# Patient Record
Sex: Male | Born: 1965 | Race: White | Hispanic: No | Marital: Single | Smoking: Former smoker
Health system: Southern US, Community
[De-identification: ages and names within clinical notes are randomized; demographics above are authoritative.]

## PROBLEM LIST (undated history)

## (undated) DIAGNOSIS — F32A Depression, unspecified: Secondary | ICD-10-CM

## (undated) DIAGNOSIS — N503 Cyst of epididymis: Secondary | ICD-10-CM

## (undated) DIAGNOSIS — M549 Dorsalgia, unspecified: Secondary | ICD-10-CM

## (undated) DIAGNOSIS — I10 Essential (primary) hypertension: Secondary | ICD-10-CM

## (undated) DIAGNOSIS — F329 Major depressive disorder, single episode, unspecified: Secondary | ICD-10-CM

## (undated) DIAGNOSIS — K219 Gastro-esophageal reflux disease without esophagitis: Secondary | ICD-10-CM

## (undated) DIAGNOSIS — K2970 Gastritis, unspecified, without bleeding: Secondary | ICD-10-CM

## (undated) DIAGNOSIS — R0789 Other chest pain: Secondary | ICD-10-CM

## (undated) DIAGNOSIS — G8929 Other chronic pain: Secondary | ICD-10-CM

## (undated) DIAGNOSIS — F1010 Alcohol abuse, uncomplicated: Secondary | ICD-10-CM

## (undated) DIAGNOSIS — K709 Alcoholic liver disease, unspecified: Secondary | ICD-10-CM

## (undated) HISTORY — PX: ABDOMINAL SURGERY: SHX537

## (undated) HISTORY — PX: APPENDECTOMY: SHX54

---

## 2000-01-25 ENCOUNTER — Emergency Department (HOSPITAL_COMMUNITY): Admission: EM | Admit: 2000-01-25 | Discharge: 2000-01-25 | Payer: Self-pay | Admitting: Emergency Medicine

## 2007-08-27 ENCOUNTER — Emergency Department (HOSPITAL_COMMUNITY): Admission: EM | Admit: 2007-08-27 | Discharge: 2007-08-27 | Payer: Self-pay | Admitting: Emergency Medicine

## 2007-10-19 ENCOUNTER — Inpatient Hospital Stay (HOSPITAL_COMMUNITY): Admission: EM | Admit: 2007-10-19 | Discharge: 2007-10-24 | Payer: Self-pay | Admitting: Pulmonary Disease

## 2007-10-23 ENCOUNTER — Ambulatory Visit: Payer: Self-pay | Admitting: Psychiatry

## 2007-12-27 ENCOUNTER — Inpatient Hospital Stay (HOSPITAL_COMMUNITY): Admission: EM | Admit: 2007-12-27 | Discharge: 2008-01-02 | Payer: Self-pay | Admitting: Emergency Medicine

## 2008-03-09 ENCOUNTER — Inpatient Hospital Stay (HOSPITAL_COMMUNITY): Admission: EM | Admit: 2008-03-09 | Discharge: 2008-03-12 | Payer: Self-pay | Admitting: Emergency Medicine

## 2008-03-11 ENCOUNTER — Encounter (INDEPENDENT_AMBULATORY_CARE_PROVIDER_SITE_OTHER): Payer: Self-pay | Admitting: Internal Medicine

## 2008-04-03 ENCOUNTER — Ambulatory Visit: Payer: Self-pay | Admitting: *Deleted

## 2008-04-03 ENCOUNTER — Inpatient Hospital Stay (HOSPITAL_COMMUNITY): Admission: EM | Admit: 2008-04-03 | Discharge: 2008-04-06 | Payer: Self-pay | Admitting: Emergency Medicine

## 2008-04-19 ENCOUNTER — Inpatient Hospital Stay (HOSPITAL_COMMUNITY): Admission: EM | Admit: 2008-04-19 | Discharge: 2008-04-20 | Payer: Self-pay | Admitting: Emergency Medicine

## 2008-05-04 ENCOUNTER — Inpatient Hospital Stay (HOSPITAL_COMMUNITY): Admission: EM | Admit: 2008-05-04 | Discharge: 2008-05-11 | Payer: Self-pay | Admitting: Emergency Medicine

## 2009-01-03 ENCOUNTER — Emergency Department (HOSPITAL_COMMUNITY): Admission: EM | Admit: 2009-01-03 | Discharge: 2009-01-03 | Payer: Self-pay | Admitting: Emergency Medicine

## 2009-02-28 ENCOUNTER — Inpatient Hospital Stay (HOSPITAL_COMMUNITY): Admission: AD | Admit: 2009-02-28 | Discharge: 2009-03-12 | Payer: Self-pay | Admitting: Psychiatry

## 2009-02-28 ENCOUNTER — Ambulatory Visit: Payer: Self-pay | Admitting: Psychiatry

## 2009-02-28 ENCOUNTER — Emergency Department (HOSPITAL_COMMUNITY): Admission: EM | Admit: 2009-02-28 | Discharge: 2009-02-28 | Payer: Self-pay | Admitting: Emergency Medicine

## 2009-03-07 ENCOUNTER — Emergency Department (HOSPITAL_COMMUNITY): Admission: EM | Admit: 2009-03-07 | Discharge: 2009-03-07 | Payer: Self-pay | Admitting: Emergency Medicine

## 2009-06-29 ENCOUNTER — Emergency Department (HOSPITAL_COMMUNITY): Admission: EM | Admit: 2009-06-29 | Discharge: 2009-06-29 | Payer: Self-pay | Admitting: Emergency Medicine

## 2009-07-01 ENCOUNTER — Emergency Department (HOSPITAL_COMMUNITY): Admission: EM | Admit: 2009-07-01 | Discharge: 2009-07-01 | Payer: Self-pay | Admitting: Emergency Medicine

## 2009-08-27 ENCOUNTER — Inpatient Hospital Stay (HOSPITAL_COMMUNITY): Admission: EM | Admit: 2009-08-27 | Discharge: 2009-08-30 | Payer: Self-pay | Admitting: Emergency Medicine

## 2009-10-13 ENCOUNTER — Ambulatory Visit: Payer: Self-pay | Admitting: Internal Medicine

## 2009-10-13 ENCOUNTER — Inpatient Hospital Stay (HOSPITAL_COMMUNITY): Admission: EM | Admit: 2009-10-13 | Discharge: 2009-10-15 | Payer: Self-pay | Admitting: Emergency Medicine

## 2009-10-15 ENCOUNTER — Encounter (INDEPENDENT_AMBULATORY_CARE_PROVIDER_SITE_OTHER): Payer: Self-pay | Admitting: Internal Medicine

## 2009-10-15 DIAGNOSIS — F1010 Alcohol abuse, uncomplicated: Secondary | ICD-10-CM

## 2009-10-15 DIAGNOSIS — R0789 Other chest pain: Secondary | ICD-10-CM | POA: Insufficient documentation

## 2009-11-02 ENCOUNTER — Ambulatory Visit: Payer: Self-pay | Admitting: Internal Medicine

## 2009-11-02 ENCOUNTER — Observation Stay (HOSPITAL_COMMUNITY): Admission: EM | Admit: 2009-11-02 | Discharge: 2009-11-05 | Payer: Self-pay | Admitting: Emergency Medicine

## 2009-11-04 ENCOUNTER — Encounter (INDEPENDENT_AMBULATORY_CARE_PROVIDER_SITE_OTHER): Payer: Self-pay | Admitting: Internal Medicine

## 2009-11-27 ENCOUNTER — Emergency Department (HOSPITAL_COMMUNITY): Admission: EM | Admit: 2009-11-27 | Discharge: 2009-11-28 | Payer: Self-pay | Admitting: Emergency Medicine

## 2009-12-03 ENCOUNTER — Emergency Department (HOSPITAL_COMMUNITY): Admission: EM | Admit: 2009-12-03 | Discharge: 2009-12-03 | Payer: Self-pay | Admitting: Emergency Medicine

## 2010-07-16 NOTE — Miscellaneous (Signed)
Summary: hosp d/c  Hospital Discharge  Date of admission: 10/13/09  Date of discharge: 10/15/09  Brief reason for admission/active problems: Pt with h/o alcohol abuse admitted for chest pain, nausea, and vomiting in the context of an alcohol binge. He had a negative stress test and NM cardiac scan, so GI etiology (esophageal spasm, esophagitis) is most likely etiology. He displayed minimal agitation during hospitalization and was not sent home with any benzos.  Followup needed: Brent Gay will f/u with Dr. Sherryll Burger on May 25 @ 2:30 pm. At this appt, please eval the pt's pain. If it has worsened or not resolved, a GI referral may be warranted. Also, please see how the patient is doing with his abstinence. He has a lot of stress right now with his girlfriend's recent diagnosis of cancer, which prompted his current binge.  The medication and problem lists have been updated.  Please see the dictated discharge summary for details.    Patient Instructions: 1)  You have a follow-up appointment with Dr. Sherryll Burger in the [facility] on May 25 @ 2:30 pm.  2)  Try to not drink any alcohol as this will likely make your chest pain even worse and also is not healthy for you.

## 2010-07-23 ENCOUNTER — Emergency Department (HOSPITAL_COMMUNITY): Payer: Self-pay

## 2010-07-23 ENCOUNTER — Emergency Department (HOSPITAL_COMMUNITY)
Admission: EM | Admit: 2010-07-23 | Discharge: 2010-07-23 | Disposition: A | Discharge status: Home / Self Care | Payer: Self-pay | Attending: Emergency Medicine | Admitting: Emergency Medicine

## 2010-07-23 DIAGNOSIS — I10 Essential (primary) hypertension: Secondary | ICD-10-CM | POA: Insufficient documentation

## 2010-07-23 DIAGNOSIS — R0989 Other specified symptoms and signs involving the circulatory and respiratory systems: Secondary | ICD-10-CM | POA: Insufficient documentation

## 2010-07-23 DIAGNOSIS — R059 Cough, unspecified: Secondary | ICD-10-CM | POA: Insufficient documentation

## 2010-07-23 DIAGNOSIS — R000 Tachycardia, unspecified: Secondary | ICD-10-CM | POA: Insufficient documentation

## 2010-07-23 DIAGNOSIS — R0609 Other forms of dyspnea: Secondary | ICD-10-CM | POA: Insufficient documentation

## 2010-07-23 DIAGNOSIS — R079 Chest pain, unspecified: Secondary | ICD-10-CM | POA: Insufficient documentation

## 2010-07-23 DIAGNOSIS — R05 Cough: Secondary | ICD-10-CM | POA: Insufficient documentation

## 2010-07-23 DIAGNOSIS — F10229 Alcohol dependence with intoxication, unspecified: Secondary | ICD-10-CM | POA: Insufficient documentation

## 2010-07-23 LAB — DIFFERENTIAL
Eosinophils Relative: 6 % — ABNORMAL HIGH (ref 0–5)
Lymphocytes Relative: 40 % (ref 12–46)
Lymphs Abs: 3.7 10*3/uL (ref 0.7–4.0)
Monocytes Absolute: 0.6 10*3/uL (ref 0.1–1.0)
Monocytes Relative: 7 % (ref 3–12)

## 2010-07-23 LAB — RAPID URINE DRUG SCREEN, HOSP PERFORMED
Benzodiazepines: NOT DETECTED
Cocaine: NOT DETECTED

## 2010-07-23 LAB — CBC
HCT: 47 % (ref 39.0–52.0)
MCH: 31.2 pg (ref 26.0–34.0)
MCHC: 36 g/dL (ref 30.0–36.0)
MCV: 86.9 fL (ref 78.0–100.0)
RDW: 12.6 % (ref 11.5–15.5)

## 2010-07-23 LAB — COMPREHENSIVE METABOLIC PANEL
ALT: 16 U/L (ref 0–53)
Albumin: 3.8 g/dL (ref 3.5–5.2)
BUN: 7 mg/dL (ref 6–23)
CO2: 28 mEq/L (ref 19–32)
Calcium: 8.3 mg/dL — ABNORMAL LOW (ref 8.4–10.5)
Creatinine, Ser: 0.87 mg/dL (ref 0.4–1.5)
GFR calc non Af Amer: >60 mL/min (ref >60)
Glucose, Bld: 139 mg/dL — ABNORMAL HIGH (ref 70–99)
Sodium: 141 mEq/L (ref 135–145)
Total Protein: 7.4 g/dL (ref 6.0–8.3)

## 2010-07-23 LAB — POCT CARDIAC MARKERS

## 2010-07-23 LAB — LIPASE, BLOOD: Lipase: 24 U/L (ref 11–59)

## 2010-07-23 LAB — ETHANOL: Alcohol, Ethyl (B): <5 mg/dL (ref 0–10)

## 2010-08-30 LAB — DIFFERENTIAL
Basophils Absolute: 0 10*3/uL (ref 0.0–0.1)
Basophils Absolute: 0 10*3/uL (ref 0.0–0.1)
Basophils Absolute: 0 10*3/uL (ref 0.0–0.1)
Basophils Relative: 1 % (ref 0–1)
Eosinophils Absolute: 0 10*3/uL (ref 0.0–0.7)
Eosinophils Absolute: 0.1 10*3/uL (ref 0.0–0.7)
Eosinophils Absolute: 0.1 10*3/uL (ref 0.0–0.7)
Eosinophils Absolute: 0.1 10*3/uL (ref 0.0–0.7)
Eosinophils Relative: 0 % (ref 0–5)
Eosinophils Relative: 2 % (ref 0–5)
Eosinophils Relative: 2 % (ref 0–5)
Lymphocytes Relative: 27 % (ref 12–46)
Lymphs Abs: 1.1 10*3/uL (ref 0.7–4.0)
Monocytes Absolute: 0.4 10*3/uL (ref 0.1–1.0)
Monocytes Absolute: 0.5 10*3/uL (ref 0.1–1.0)
Neutrophils Relative %: 33 % — ABNORMAL LOW (ref 43–77)

## 2010-08-30 LAB — HEPATIC FUNCTION PANEL
ALT: 80 U/L — ABNORMAL HIGH (ref 0–53)
AST: 101 U/L — ABNORMAL HIGH (ref 0–37)
Albumin: 3.7 g/dL (ref 3.5–5.2)
Albumin: 3.9 g/dL (ref 3.5–5.2)
Bilirubin, Direct: 0.2 mg/dL (ref 0.0–0.3)
Total Bilirubin: 0.9 mg/dL (ref 0.3–1.2)
Total Protein: 7.3 g/dL (ref 6.0–8.3)

## 2010-08-30 LAB — URINALYSIS, ROUTINE W REFLEX MICROSCOPIC
Bilirubin Urine: NEGATIVE
Bilirubin Urine: NEGATIVE
Glucose, UA: NEGATIVE mg/dL
Hgb urine dipstick: NEGATIVE
Hgb urine dipstick: NEGATIVE
Ketones, ur: NEGATIVE mg/dL
Ketones, ur: NEGATIVE mg/dL
Leukocytes, UA: NEGATIVE
Nitrite: NEGATIVE
Nitrite: NEGATIVE
Nitrite: NEGATIVE
Nitrite: NEGATIVE
Protein, ur: 30 mg/dL — AB
Protein, ur: NEGATIVE mg/dL
Specific Gravity, Urine: 1.02 (ref 1.005–1.030)
Specific Gravity, Urine: 1.006 (ref 1.005–1.030)
Specific Gravity, Urine: 1.016 (ref 1.005–1.030)
Specific Gravity, Urine: 1.017 (ref 1.005–1.030)
Urobilinogen, UA: 0.2 mg/dL (ref 0.0–1.0)
Urobilinogen, UA: 2 mg/dL — ABNORMAL HIGH (ref 0.0–1.0)
pH: 6 (ref 5.0–8.0)
pH: 5 (ref 5.0–8.0)
pH: 7 (ref 5.0–8.0)

## 2010-08-30 LAB — CBC
HCT: 39.1 % (ref 39.0–52.0)
Hemoglobin: 13.8 g/dL (ref 13.0–17.0)
Hemoglobin: 14.1 g/dL (ref 13.0–17.0)
MCHC: 33.9 g/dL (ref 30.0–36.0)
MCHC: 34.7 g/dL (ref 30.0–36.0)
MCHC: 35.4 g/dL (ref 30.0–36.0)
MCV: 101.5 fL — ABNORMAL HIGH (ref 78.0–100.0)
MCV: 101.6 fL — ABNORMAL HIGH (ref 78.0–100.0)
Platelets: 132 10*3/uL — ABNORMAL LOW (ref 150–400)
Platelets: 29 10*3/uL — CL (ref 150–400)
RBC: 4.47 MIL/uL (ref 4.22–5.81)
RBC: 4.2 MIL/uL — ABNORMAL LOW (ref 4.22–5.81)
RDW: 14.2 % (ref 11.5–15.5)
WBC: 4.6 10*3/uL (ref 4.0–10.5)
WBC: 5.9 10*3/uL (ref 4.0–10.5)

## 2010-08-30 LAB — COMPREHENSIVE METABOLIC PANEL
ALT: 45 U/L (ref 0–53)
ALT: 34 U/L (ref 0–53)
AST: 88 U/L — ABNORMAL HIGH (ref 0–37)
AST: 43 U/L — ABNORMAL HIGH (ref 0–37)
CO2: 18 mEq/L — ABNORMAL LOW (ref 19–32)
CO2: 26 mEq/L (ref 19–32)
Chloride: 97 mEq/L (ref 96–112)
Chloride: 103 mEq/L (ref 96–112)
GFR calc Af Amer: >60 mL/min (ref >60)
GFR calc Af Amer: >60 mL/min (ref >60)
GFR calc non Af Amer: >60 mL/min (ref >60)
GFR calc non Af Amer: >60 mL/min (ref >60)
Sodium: 136 mEq/L (ref 135–145)
Sodium: 140 mEq/L (ref 135–145)
Total Bilirubin: 1.2 mg/dL (ref 0.3–1.2)
Total Bilirubin: 0.6 mg/dL (ref 0.3–1.2)

## 2010-08-30 LAB — URINE CULTURE: Culture: NO GROWTH

## 2010-08-30 LAB — RAPID URINE DRUG SCREEN, HOSP PERFORMED
Amphetamines: NOT DETECTED
Barbiturates: NOT DETECTED
Benzodiazepines: NOT DETECTED
Cocaine: POSITIVE — AB
Opiates: NOT DETECTED
Opiates: NOT DETECTED
Tetrahydrocannabinol: NOT DETECTED
Tetrahydrocannabinol: NOT DETECTED
Tetrahydrocannabinol: NOT DETECTED

## 2010-08-30 LAB — BASIC METABOLIC PANEL
BUN: 2 mg/dL — ABNORMAL LOW (ref 6–23)
BUN: 4 mg/dL — ABNORMAL LOW (ref 6–23)
CO2: 25 mEq/L (ref 19–32)
Calcium: 8.6 mg/dL (ref 8.4–10.5)
Chloride: 108 mEq/L (ref 96–112)
Creatinine, Ser: 0.83 mg/dL (ref 0.4–1.5)
Creatinine, Ser: 0.89 mg/dL (ref 0.4–1.5)
GFR calc non Af Amer: >60 mL/min (ref >60)
Glucose, Bld: 103 mg/dL — ABNORMAL HIGH (ref 70–99)
Potassium: 3.7 mEq/L (ref 3.5–5.1)

## 2010-08-30 LAB — POCT CARDIAC MARKERS
CKMB, poc: 1.3 ng/mL (ref 1.0–8.0)
CKMB, poc: 1.5 ng/mL (ref 1.0–8.0)
CKMB, poc: <1 ng/mL — ABNORMAL LOW (ref 1.0–8.0)
Myoglobin, poc: 139 ng/mL (ref 12–200)
Myoglobin, poc: 33.2 ng/mL (ref 12–200)
Myoglobin, poc: 53.4 ng/mL (ref 12–200)
Troponin i, poc: <0.05 ng/mL (ref 0.00–0.09)
Troponin i, poc: <0.05 ng/mL (ref 0.00–0.09)
Troponin i, poc: <0.05 ng/mL (ref 0.00–0.09)

## 2010-08-30 LAB — LIPASE, BLOOD
Lipase: 50 U/L (ref 11–59)
Lipase: 62 U/L — ABNORMAL HIGH (ref 11–59)

## 2010-08-30 LAB — URINE MICROSCOPIC-ADD ON

## 2010-08-30 LAB — ETHANOL
Alcohol, Ethyl (B): 167 mg/dL — ABNORMAL HIGH (ref 0–10)
Alcohol, Ethyl (B): 237 mg/dL — ABNORMAL HIGH (ref 0–10)
Alcohol, Ethyl (B): 109 mg/dL — ABNORMAL HIGH (ref 0–10)
Alcohol, Ethyl (B): 145 mg/dL — ABNORMAL HIGH (ref 0–10)
Alcohol, Ethyl (B): 7 mg/dL (ref 0–10)

## 2010-08-30 LAB — POCT I-STAT, CHEM 8
BUN: <3 mg/dL — ABNORMAL LOW (ref 6–23)
Chloride: 96 mEq/L (ref 96–112)
Creatinine, Ser: 1 mg/dL (ref 0.4–1.5)
Potassium: 2.8 mEq/L — ABNORMAL LOW (ref 3.5–5.1)
Sodium: 139 mEq/L (ref 135–145)

## 2010-08-30 LAB — CK: Total CK: 166 U/L (ref 7–232)

## 2010-08-30 LAB — TRICYCLICS SCREEN, URINE: TCA Scrn: NOT DETECTED

## 2010-08-30 LAB — ACETAMINOPHEN LEVEL: Acetaminophen (Tylenol), Serum: <10 ug/mL — ABNORMAL LOW (ref 10–30)

## 2010-08-30 LAB — SALICYLATE LEVEL: Salicylate Lvl: <4 mg/dL (ref 2.8–20.0)

## 2010-08-31 LAB — BASIC METABOLIC PANEL
BUN: 4 mg/dL — ABNORMAL LOW (ref 6–23)
BUN: 5 mg/dL — ABNORMAL LOW (ref 6–23)
CO2: 27 mEq/L (ref 19–32)
CO2: 29 mEq/L (ref 19–32)
Calcium: 9.1 mg/dL (ref 8.4–10.5)
Chloride: 101 mEq/L (ref 96–112)
Creatinine, Ser: 0.75 mg/dL (ref 0.4–1.5)
GFR calc non Af Amer: >60 mL/min (ref >60)
Glucose, Bld: 113 mg/dL — ABNORMAL HIGH (ref 70–99)
Glucose, Bld: 125 mg/dL — ABNORMAL HIGH (ref 70–99)
Potassium: 4 mEq/L (ref 3.5–5.1)
Sodium: 140 mEq/L (ref 135–145)

## 2010-08-31 LAB — RAPID URINE DRUG SCREEN, HOSP PERFORMED
Amphetamines: NOT DETECTED
Benzodiazepines: NOT DETECTED
Cocaine: NOT DETECTED
Opiates: NOT DETECTED
Tetrahydrocannabinol: NOT DETECTED

## 2010-08-31 LAB — COMPREHENSIVE METABOLIC PANEL
AST: 70 U/L — ABNORMAL HIGH (ref 0–37)
Albumin: 3.2 g/dL — ABNORMAL LOW (ref 3.5–5.2)
Chloride: 102 mEq/L (ref 96–112)
Creatinine, Ser: 0.68 mg/dL (ref 0.4–1.5)
GFR calc Af Amer: >60 mL/min (ref >60)
Potassium: 3.4 mEq/L — ABNORMAL LOW (ref 3.5–5.1)
Total Bilirubin: 1.2 mg/dL (ref 0.3–1.2)
Total Protein: 6 g/dL (ref 6.0–8.3)

## 2010-08-31 LAB — POCT I-STAT, CHEM 8
Creatinine, Ser: 0.9 mg/dL (ref 0.4–1.5)
HCT: 49 % (ref 39.0–52.0)
Hemoglobin: 16.7 g/dL (ref 13.0–17.0)
Potassium: 3.5 mEq/L (ref 3.5–5.1)
Sodium: 137 mEq/L (ref 135–145)

## 2010-08-31 LAB — CBC
HCT: 36.9 % — ABNORMAL LOW (ref 39.0–52.0)
MCHC: 34.3 g/dL (ref 30.0–36.0)
MCHC: 35.3 g/dL (ref 30.0–36.0)
MCV: 101.8 fL — ABNORMAL HIGH (ref 78.0–100.0)
Platelets: 101 10*3/uL — ABNORMAL LOW (ref 150–400)
Platelets: 131 10*3/uL — ABNORMAL LOW (ref 150–400)
RDW: 12.9 % (ref 11.5–15.5)
RDW: 13.2 % (ref 11.5–15.5)
RDW: 13.3 % (ref 11.5–15.5)
WBC: 6.1 10*3/uL (ref 4.0–10.5)
WBC: 6.6 10*3/uL (ref 4.0–10.5)

## 2010-08-31 LAB — DIFFERENTIAL
Basophils Absolute: 0.1 10*3/uL (ref 0.0–0.1)
Basophils Relative: 0 % (ref 0–1)
Basophils Relative: 1 % (ref 0–1)
Eosinophils Absolute: 0.3 10*3/uL (ref 0.0–0.7)
Eosinophils Relative: 5 % (ref 0–5)
Lymphs Abs: 1.5 10*3/uL (ref 0.7–4.0)
Monocytes Absolute: 0.4 10*3/uL (ref 0.1–1.0)
Neutro Abs: 5.2 10*3/uL (ref 1.7–7.7)
Neutrophils Relative %: 65 % (ref 43–77)
Neutrophils Relative %: 65 % (ref 43–77)

## 2010-08-31 LAB — CK TOTAL AND CKMB (NOT AT ARMC)
CK, MB: 0.9 ng/mL (ref 0.3–4.0)
CK, MB: 0.9 ng/mL (ref 0.3–4.0)
Total CK: 122 U/L (ref 7–232)
Total CK: 132 U/L (ref 7–232)

## 2010-08-31 LAB — MAGNESIUM
Magnesium: 1.5 mg/dL (ref 1.5–2.5)
Magnesium: 1.6 mg/dL (ref 1.5–2.5)

## 2010-08-31 LAB — URINALYSIS, ROUTINE W REFLEX MICROSCOPIC
Bilirubin Urine: NEGATIVE
Ketones, ur: 40 mg/dL — AB
Nitrite: NEGATIVE
Protein, ur: NEGATIVE mg/dL
Urobilinogen, UA: 0.2 mg/dL (ref 0.0–1.0)

## 2010-08-31 LAB — POCT CARDIAC MARKERS
CKMB, poc: <1 ng/mL — ABNORMAL LOW (ref 1.0–8.0)
Myoglobin, poc: 51.8 ng/mL (ref 12–200)

## 2010-08-31 LAB — PHOSPHORUS: Phosphorus: 2.7 mg/dL (ref 2.3–4.6)

## 2010-08-31 LAB — HEPATIC FUNCTION PANEL
AST: 106 U/L — ABNORMAL HIGH (ref 0–37)
Bilirubin, Direct: 0.4 mg/dL — ABNORMAL HIGH (ref 0.0–0.3)

## 2010-08-31 LAB — URINE CULTURE

## 2010-08-31 LAB — TROPONIN I
Troponin I: 0.01 ng/mL (ref 0.00–0.06)
Troponin I: 0.01 ng/mL (ref 0.00–0.06)

## 2010-09-01 LAB — DIFFERENTIAL
Eosinophils Absolute: 0.3 10*3/uL (ref 0.0–0.7)
Eosinophils Relative: 1 % (ref 0–5)
Eosinophils Relative: 2 % (ref 0–5)
Lymphocytes Relative: 10 % — ABNORMAL LOW (ref 12–46)
Lymphocytes Relative: 7 % — ABNORMAL LOW (ref 12–46)
Lymphs Abs: 1.1 10*3/uL (ref 0.7–4.0)
Lymphs Abs: 1.3 10*3/uL (ref 0.7–4.0)
Monocytes Absolute: 0.5 10*3/uL (ref 0.1–1.0)
Monocytes Relative: 3 % (ref 3–12)
Monocytes Relative: 5 % (ref 3–12)

## 2010-09-01 LAB — RAPID URINE DRUG SCREEN, HOSP PERFORMED
Amphetamines: NOT DETECTED
Barbiturates: NOT DETECTED
Benzodiazepines: NOT DETECTED
Opiates: NOT DETECTED
Tetrahydrocannabinol: NOT DETECTED

## 2010-09-01 LAB — CBC
HCT: 39.2 % (ref 39.0–52.0)
Hemoglobin: 14.2 g/dL (ref 13.0–17.0)
MCHC: 35.1 g/dL (ref 30.0–36.0)
MCHC: 35.1 g/dL (ref 30.0–36.0)
MCV: 101.4 fL — ABNORMAL HIGH (ref 78.0–100.0)
MCV: 102 fL — ABNORMAL HIGH (ref 78.0–100.0)
Platelets: 74 10*3/uL — ABNORMAL LOW (ref 150–400)
Platelets: 89 10*3/uL — ABNORMAL LOW (ref 150–400)
Platelets: 91 10*3/uL — ABNORMAL LOW (ref 150–400)
RBC: 3.84 MIL/uL — ABNORMAL LOW (ref 4.22–5.81)
RDW: 12.4 % (ref 11.5–15.5)
RDW: 12.6 % (ref 11.5–15.5)
WBC: 9.8 10*3/uL (ref 4.0–10.5)

## 2010-09-01 LAB — POCT CARDIAC MARKERS
Myoglobin, poc: 67.5 ng/mL (ref 12–200)
Troponin i, poc: <0.05 ng/mL (ref 0.00–0.09)

## 2010-09-01 LAB — COMPREHENSIVE METABOLIC PANEL
ALT: 27 U/L (ref 0–53)
AST: 50 U/L — ABNORMAL HIGH (ref 0–37)
Albumin: 3.7 g/dL (ref 3.5–5.2)
Albumin: 4.2 g/dL (ref 3.5–5.2)
Alkaline Phosphatase: 58 U/L (ref 39–117)
Calcium: 8.5 mg/dL (ref 8.4–10.5)
Calcium: 9.2 mg/dL (ref 8.4–10.5)
Creatinine, Ser: 0.81 mg/dL (ref 0.4–1.5)
GFR calc Af Amer: >60 mL/min (ref >60)
GFR calc Af Amer: >60 mL/min (ref >60)
GFR calc non Af Amer: >60 mL/min (ref >60)
Potassium: 3.9 mEq/L (ref 3.5–5.1)
Sodium: 134 mEq/L — ABNORMAL LOW (ref 135–145)
Total Protein: 6.7 g/dL (ref 6.0–8.3)
Total Protein: 8 g/dL (ref 6.0–8.3)

## 2010-09-01 LAB — APTT: aPTT: 25 seconds (ref 24–37)

## 2010-09-01 LAB — URINALYSIS, ROUTINE W REFLEX MICROSCOPIC
Glucose, UA: NEGATIVE mg/dL
Hgb urine dipstick: NEGATIVE
Ketones, ur: >80 mg/dL — AB
Leukocytes, UA: NEGATIVE
pH: 5.5 (ref 5.0–8.0)

## 2010-09-01 LAB — CARDIAC PANEL(CRET KIN+CKTOT+MB+TROPI)
CK, MB: 0.8 ng/mL (ref 0.3–4.0)
CK, MB: 0.9 ng/mL (ref 0.3–4.0)
Relative Index: 0.7 (ref 0.0–2.5)
Relative Index: INVALID (ref 0.0–2.5)
Total CK: 107 U/L (ref 7–232)
Troponin I: 0.01 ng/mL (ref 0.00–0.06)
Troponin I: <0.01 ng/mL (ref 0.00–0.06)

## 2010-09-01 LAB — URINE MICROSCOPIC-ADD ON

## 2010-09-01 LAB — LIPASE, BLOOD: Lipase: 35 U/L (ref 11–59)

## 2010-09-01 LAB — TROPONIN I: Troponin I: 0.13 ng/mL — ABNORMAL HIGH (ref 0.00–0.06)

## 2010-09-04 LAB — CBC
HCT: 47.4 % (ref 39.0–52.0)
Hemoglobin: 16.6 g/dL (ref 13.0–17.0)
MCHC: 35.6 g/dL (ref 30.0–36.0)
Platelets: 67 10*3/uL — ABNORMAL LOW (ref 150–400)
RBC: 4.56 MIL/uL (ref 4.22–5.81)
RDW: 13.6 % (ref 11.5–15.5)
RDW: 13.7 % (ref 11.5–15.5)

## 2010-09-04 LAB — RAPID URINE DRUG SCREEN, HOSP PERFORMED
Amphetamines: NOT DETECTED
Barbiturates: NOT DETECTED
Cocaine: NOT DETECTED
Opiates: NOT DETECTED

## 2010-09-04 LAB — GLUCOSE, CAPILLARY
Glucose-Capillary: 116 mg/dL — ABNORMAL HIGH (ref 70–99)
Glucose-Capillary: 123 mg/dL — ABNORMAL HIGH (ref 70–99)
Glucose-Capillary: 126 mg/dL — ABNORMAL HIGH (ref 70–99)
Glucose-Capillary: 132 mg/dL — ABNORMAL HIGH (ref 70–99)
Glucose-Capillary: 145 mg/dL — ABNORMAL HIGH (ref 70–99)
Glucose-Capillary: 72 mg/dL (ref 70–99)
Glucose-Capillary: 72 mg/dL (ref 70–99)
Glucose-Capillary: 83 mg/dL (ref 70–99)
Glucose-Capillary: 93 mg/dL (ref 70–99)

## 2010-09-04 LAB — COMPREHENSIVE METABOLIC PANEL
ALT: 23 U/L (ref 0–53)
ALT: 26 U/L (ref 0–53)
AST: 40 U/L — ABNORMAL HIGH (ref 0–37)
AST: 46 U/L — ABNORMAL HIGH (ref 0–37)
Albumin: 3 g/dL — ABNORMAL LOW (ref 3.5–5.2)
CO2: 29 mEq/L (ref 19–32)
Calcium: 7.3 mg/dL — ABNORMAL LOW (ref 8.4–10.5)
Calcium: 8.1 mg/dL — ABNORMAL LOW (ref 8.4–10.5)
GFR calc Af Amer: >60 mL/min (ref >60)
GFR calc Af Amer: >60 mL/min (ref >60)
GFR calc non Af Amer: >60 mL/min (ref >60)
Potassium: 2.9 mEq/L — ABNORMAL LOW (ref 3.5–5.1)
Potassium: 3.4 mEq/L — ABNORMAL LOW (ref 3.5–5.1)
Sodium: 136 mEq/L (ref 135–145)
Sodium: 139 mEq/L (ref 135–145)
Total Protein: 5.7 g/dL — ABNORMAL LOW (ref 6.0–8.3)

## 2010-09-04 LAB — CARDIAC PANEL(CRET KIN+CKTOT+MB+TROPI)
CK, MB: 0.5 ng/mL (ref 0.3–4.0)
CK, MB: 0.6 ng/mL (ref 0.3–4.0)
CK, MB: 0.7 ng/mL (ref 0.3–4.0)
Relative Index: 0.2 (ref 0.0–2.5)
Relative Index: 0.3 (ref 0.0–2.5)
Total CK: 270 U/L — ABNORMAL HIGH (ref 7–232)

## 2010-09-04 LAB — HEPATIC FUNCTION PANEL
ALT: 32 U/L (ref 0–53)
AST: 62 U/L — ABNORMAL HIGH (ref 0–37)
Alkaline Phosphatase: 96 U/L (ref 39–117)
Bilirubin, Direct: 0.2 mg/dL (ref 0.0–0.3)
Indirect Bilirubin: 0.3 mg/dL (ref 0.3–0.9)
Total Bilirubin: 0.5 mg/dL (ref 0.3–1.2)

## 2010-09-04 LAB — LIPASE, BLOOD: Lipase: 74 U/L — ABNORMAL HIGH (ref 11–59)

## 2010-09-04 LAB — POCT I-STAT, CHEM 8
BUN: <3 mg/dL — ABNORMAL LOW (ref 6–23)
Calcium, Ion: 0.97 mmol/L — ABNORMAL LOW (ref 1.12–1.32)
Chloride: 97 mEq/L (ref 96–112)
Creatinine, Ser: 0.7 mg/dL (ref 0.4–1.5)
Glucose, Bld: 101 mg/dL — ABNORMAL HIGH (ref 70–99)
HCT: 47 % (ref 39.0–52.0)
Potassium: 2.8 mEq/L — ABNORMAL LOW (ref 3.5–5.1)

## 2010-09-04 LAB — IRON AND TIBC
Saturation Ratios: 26 % (ref 20–55)
TIBC: 253 ug/dL (ref 215–435)
UIBC: 186 ug/dL

## 2010-09-04 LAB — BASIC METABOLIC PANEL
BUN: 2 mg/dL — ABNORMAL LOW (ref 6–23)
Creatinine, Ser: 0.76 mg/dL (ref 0.4–1.5)
GFR calc non Af Amer: >60 mL/min (ref >60)

## 2010-09-04 LAB — CK TOTAL AND CKMB (NOT AT ARMC)
CK, MB: 0.8 ng/mL (ref 0.3–4.0)
Relative Index: 0.2 (ref 0.0–2.5)

## 2010-09-04 LAB — FOLATE: Folate: >20 ng/mL

## 2010-09-04 LAB — TROPONIN I: Troponin I: 0.01 ng/mL (ref 0.00–0.06)

## 2010-09-04 LAB — URINALYSIS, ROUTINE W REFLEX MICROSCOPIC
Glucose, UA: NEGATIVE mg/dL
Hgb urine dipstick: NEGATIVE
Specific Gravity, Urine: 1.002 — ABNORMAL LOW (ref 1.005–1.030)
Urobilinogen, UA: 1 mg/dL (ref 0.0–1.0)
pH: 7 (ref 5.0–8.0)

## 2010-09-04 LAB — ETHANOL: Alcohol, Ethyl (B): 356 mg/dL — ABNORMAL HIGH (ref 0–10)

## 2010-09-04 LAB — DIFFERENTIAL
Eosinophils Relative: 5 % (ref 0–5)
Lymphocytes Relative: 36 % (ref 12–46)
Monocytes Absolute: 0.7 10*3/uL (ref 0.1–1.0)
Monocytes Relative: 13 % — ABNORMAL HIGH (ref 3–12)
Neutro Abs: 2.5 10*3/uL (ref 1.7–7.7)

## 2010-09-04 LAB — MAGNESIUM: Magnesium: 1.1 mg/dL — ABNORMAL LOW (ref 1.5–2.5)

## 2010-09-05 ENCOUNTER — Emergency Department (HOSPITAL_COMMUNITY)
Admission: EM | Admit: 2010-09-05 | Discharge: 2010-09-06 | Disposition: A | Discharge status: Home / Self Care | Payer: Self-pay | Attending: Emergency Medicine | Admitting: Emergency Medicine

## 2010-09-05 ENCOUNTER — Emergency Department (HOSPITAL_COMMUNITY)
Admission: EM | Admit: 2010-09-05 | Discharge: 2010-09-05 | Disposition: A | Discharge status: Home / Self Care | Payer: Self-pay | Attending: Emergency Medicine | Admitting: Emergency Medicine

## 2010-09-05 ENCOUNTER — Emergency Department (HOSPITAL_COMMUNITY): Payer: Self-pay

## 2010-09-05 DIAGNOSIS — R630 Anorexia: Secondary | ICD-10-CM | POA: Insufficient documentation

## 2010-09-05 DIAGNOSIS — F3289 Other specified depressive episodes: Secondary | ICD-10-CM | POA: Insufficient documentation

## 2010-09-05 DIAGNOSIS — R1110 Vomiting, unspecified: Secondary | ICD-10-CM | POA: Insufficient documentation

## 2010-09-05 DIAGNOSIS — F329 Major depressive disorder, single episode, unspecified: Secondary | ICD-10-CM | POA: Insufficient documentation

## 2010-09-05 DIAGNOSIS — K219 Gastro-esophageal reflux disease without esophagitis: Secondary | ICD-10-CM | POA: Insufficient documentation

## 2010-09-05 DIAGNOSIS — I10 Essential (primary) hypertension: Secondary | ICD-10-CM | POA: Insufficient documentation

## 2010-09-05 DIAGNOSIS — F1010 Alcohol abuse, uncomplicated: Secondary | ICD-10-CM | POA: Insufficient documentation

## 2010-09-05 DIAGNOSIS — R1013 Epigastric pain: Secondary | ICD-10-CM | POA: Insufficient documentation

## 2010-09-05 LAB — COMPREHENSIVE METABOLIC PANEL
Alkaline Phosphatase: 67 U/L (ref 39–117)
BUN: 5 mg/dL — ABNORMAL LOW (ref 6–23)
Calcium: 8.4 mg/dL (ref 8.4–10.5)
Glucose, Bld: 109 mg/dL — ABNORMAL HIGH (ref 70–99)
Total Protein: 7.9 g/dL (ref 6.0–8.3)

## 2010-09-05 LAB — CBC
MCH: 32.2 pg (ref 26.0–34.0)
MCHC: 36.1 g/dL — ABNORMAL HIGH (ref 30.0–36.0)
MCV: 89.3 fL (ref 78.0–100.0)
Platelets: 192 10*3/uL (ref 150–400)
RDW: 13.7 % (ref 11.5–15.5)

## 2010-09-05 LAB — DIFFERENTIAL
Eosinophils Absolute: 0.2 10*3/uL (ref 0.0–0.7)
Eosinophils Relative: 2 % (ref 0–5)
Lymphs Abs: 2.2 10*3/uL (ref 0.7–4.0)
Monocytes Relative: 9 % (ref 3–12)

## 2010-09-05 LAB — RAPID URINE DRUG SCREEN, HOSP PERFORMED
Barbiturates: NOT DETECTED
Benzodiazepines: POSITIVE — AB
Cocaine: NOT DETECTED

## 2010-09-05 LAB — LIPASE, BLOOD: Lipase: 30 U/L (ref 11–59)

## 2010-09-06 LAB — LIPASE, BLOOD: Lipase: 24 U/L (ref 11–59)

## 2010-09-06 LAB — COMPREHENSIVE METABOLIC PANEL
AST: 31 U/L (ref 0–37)
BUN: 4 mg/dL — ABNORMAL LOW (ref 6–23)
CO2: 26 mEq/L (ref 19–32)
Calcium: 8.1 mg/dL — ABNORMAL LOW (ref 8.4–10.5)
Creatinine, Ser: 0.78 mg/dL (ref 0.4–1.5)
GFR calc Af Amer: >60 mL/min (ref >60)
GFR calc non Af Amer: >60 mL/min (ref >60)
Glucose, Bld: 104 mg/dL — ABNORMAL HIGH (ref 70–99)

## 2010-09-06 LAB — CBC
Hemoglobin: 13.7 g/dL (ref 13.0–17.0)
MCH: 31.7 pg (ref 26.0–34.0)
MCHC: 35.2 g/dL (ref 30.0–36.0)
MCV: 90 fL (ref 78.0–100.0)

## 2010-09-06 LAB — DIFFERENTIAL
Basophils Relative: 1 % (ref 0–1)
Monocytes Absolute: 0.8 10*3/uL (ref 0.1–1.0)
Monocytes Relative: 8 % (ref 3–12)
Neutro Abs: 7.8 10*3/uL — ABNORMAL HIGH (ref 1.7–7.7)

## 2010-09-18 LAB — COMPREHENSIVE METABOLIC PANEL
AST: 92 U/L — ABNORMAL HIGH (ref 0–37)
AST: 33 U/L (ref 0–37)
AST: 62 U/L — ABNORMAL HIGH (ref 0–37)
Albumin: 3.5 g/dL (ref 3.5–5.2)
Albumin: 3.8 g/dL (ref 3.5–5.2)
Albumin: 3.5 g/dL (ref 3.5–5.2)
Albumin: 3.8 g/dL (ref 3.5–5.2)
Alkaline Phosphatase: 66 U/L (ref 39–117)
BUN: 4 mg/dL — ABNORMAL LOW (ref 6–23)
Calcium: 9.1 mg/dL (ref 8.4–10.5)
Calcium: 9.1 mg/dL (ref 8.4–10.5)
Calcium: 9.3 mg/dL (ref 8.4–10.5)
Chloride: 97 mEq/L (ref 96–112)
Chloride: 103 mEq/L (ref 96–112)
Creatinine, Ser: 0.82 mg/dL (ref 0.4–1.5)
Creatinine, Ser: 0.98 mg/dL (ref 0.4–1.5)
Creatinine, Ser: 0.96 mg/dL (ref 0.4–1.5)
Creatinine, Ser: 1.09 mg/dL (ref 0.4–1.5)
GFR calc Af Amer: >60 mL/min (ref >60)
GFR calc Af Amer: >60 mL/min (ref >60)
GFR calc Af Amer: >60 mL/min (ref >60)
GFR calc non Af Amer: >60 mL/min (ref >60)
Glucose, Bld: 66 mg/dL — ABNORMAL LOW (ref 70–99)
Total Bilirubin: 1.4 mg/dL — ABNORMAL HIGH (ref 0.3–1.2)
Total Bilirubin: 0.4 mg/dL (ref 0.3–1.2)
Total Protein: 6.5 g/dL (ref 6.0–8.3)
Total Protein: 7.7 g/dL (ref 6.0–8.3)

## 2010-09-18 LAB — URINALYSIS, ROUTINE W REFLEX MICROSCOPIC
Bilirubin Urine: NEGATIVE
Glucose, UA: NEGATIVE mg/dL
Hgb urine dipstick: NEGATIVE
Ketones, ur: NEGATIVE mg/dL
Leukocytes, UA: NEGATIVE
Nitrite: NEGATIVE
Protein, ur: 100 mg/dL — AB
Protein, ur: NEGATIVE mg/dL
Specific Gravity, Urine: 1.03 (ref 1.005–1.030)
Urobilinogen, UA: 1 mg/dL (ref 0.0–1.0)
pH: 6 (ref 5.0–8.0)

## 2010-09-18 LAB — HEPATITIS PANEL, ACUTE
HCV Ab: NEGATIVE
Hep A IgM: NEGATIVE
Hepatitis B Surface Ag: NEGATIVE

## 2010-09-18 LAB — BLOOD GAS, VENOUS
Acid-base deficit: 5.1 mmol/L — ABNORMAL HIGH (ref 0.0–2.0)
Bicarbonate: 17.5 mEq/L — ABNORMAL LOW (ref 20.0–24.0)
O2 Saturation: 99.3 %
Patient temperature: 98.6
TCO2: 15.2 mmol/L (ref 0–100)
pH, Ven: 7.417 — ABNORMAL HIGH (ref 7.250–7.300)

## 2010-09-18 LAB — DIFFERENTIAL
Basophils Absolute: 0.1 10*3/uL (ref 0.0–0.1)
Basophils Relative: 0 % (ref 0–1)
Basophils Relative: 1 % (ref 0–1)
Eosinophils Absolute: 0.1 10*3/uL (ref 0.0–0.7)
Lymphocytes Relative: 25 % (ref 12–46)
Lymphocytes Relative: 33 % (ref 12–46)
Monocytes Absolute: 0.5 10*3/uL (ref 0.1–1.0)
Neutro Abs: 3.5 10*3/uL (ref 1.7–7.7)
Neutrophils Relative %: 47 % (ref 43–77)
Neutrophils Relative %: 61 % (ref 43–77)

## 2010-09-18 LAB — CBC
HCT: 44 % (ref 39.0–52.0)
Hemoglobin: 15.2 g/dL (ref 13.0–17.0)
MCHC: 34.6 g/dL (ref 30.0–36.0)
MCV: 97.8 fL (ref 78.0–100.0)
MCV: 99.8 fL (ref 78.0–100.0)
Platelets: 157 10*3/uL (ref 150–400)
Platelets: 85 10*3/uL — ABNORMAL LOW (ref 150–400)
RDW: 13.7 % (ref 11.5–15.5)
RDW: 14.5 % (ref 11.5–15.5)
WBC: 5.7 10*3/uL (ref 4.0–10.5)

## 2010-09-18 LAB — POCT CARDIAC MARKERS
CKMB, poc: 1.1 ng/mL (ref 1.0–8.0)
Myoglobin, poc: 63.9 ng/mL (ref 12–200)
Myoglobin, poc: 73.3 ng/mL (ref 12–200)

## 2010-09-18 LAB — CLOSTRIDIUM DIFFICILE EIA: C difficile Toxins A+B, EIA: NEGATIVE

## 2010-09-18 LAB — LIPASE, BLOOD
Lipase: 29 U/L (ref 11–59)
Lipase: 31 U/L (ref 11–59)

## 2010-09-18 LAB — RAPID URINE DRUG SCREEN, HOSP PERFORMED
Amphetamines: NOT DETECTED
Benzodiazepines: NOT DETECTED
Cocaine: NOT DETECTED

## 2010-09-18 LAB — URINE MICROSCOPIC-ADD ON

## 2010-10-27 NOTE — Discharge Summary (Signed)
NAME:  Brent Gay, Brent Gay NO.:  [id number]   MEDICAL RECORD NO.:  [id number]          PATIENT TYPE:  INP   LOCATION:  1509                         FACILITY[facility]  [facility]   PHYSICIAN:  Herbie Saxon, MDDATE OF BIRTH:  07-27-1965   DATE OF ADMISSION:  12/27/2007  DATE OF DISCHARGE:                               DISCHARGE SUMMARY   INTERIM DISCHARGE SUMMARY   DATE OF DISCHARGE:  To be determined.   DISCHARGE DIAGNOSES:  1. Acute alcoholic hepatitis.  2. Chronic liver disease.  3. Medical noncompliance.  4. Hypertension.  5. Tobacco abuse.  6. Left lower back lipoma.  7. Elevated transaminases and elevated ammonia level.  8. Thrombocytopenia.   RADIOLOGY:  Chest x-ray on December 27, 2007 showed no acute disease.  The  liver ultrasound of Oct 19, 2007 showed hepatocellular disease, splenic  granuloma, no ascites, normal gallbladder and bile duct.   HOSPITAL COURSE:  This 45 year old Caucasian male was recently  discharged about 2 months previously, re-presented to the emergency room  with generalized body cramps, nausea, vomiting, poor food intake for 3  days.  The patient had been on an alcohol binge for about 3 weeks prior  to this presentation.  He failed alcohol detox as an outpatient.  He  came in with metabolic acidosis and alcoholic intoxication, elevated  alcohol levels.  He also had hypertension emergency with sinus  tachycardia.  The patient was admitted and started on IV fluid  hydration.  Psychiatric input was sought with Dr. Jeanie Sewer, who is  recommending [facility] outpatient detox when he is medically stable to  be discharged.  The patient's hypertension and tachycardia is under  control.  However, his transaminase level has been trending upward with  a slightly elevated ammonia level.  He has been counseled extensively on  the need to discontinue alcohol, but was also discussed with his father,  Dr. Rolene Course, [phone], medical treatment plan.  The  patient was  incidentally discovered to have a left back non-fluctuant mobile  swelling, likely a lipoma.  This can be be followed up as an outpatient  by the primary care physician.  The gastroenterologist, Dr. Evette Cristal, of  [facility] has been consulted, and will await further recommendations on  his elevated transaminases.   Discharge medications to be determined on final discharge.   RECOMMENDED PLAN:  Follow GI recommendations.  Detox as an outpatient at  [facility].  Further psychiatric followup as an outpatient.  Note that  his blood pressure has been adequately controlled with beta blocker and  clonidine.  Will keep monitoring his ammonia level.  He has been started  on low dose lactulose.  Also is to avoid hepatotoxins such as Tylenol.  He is to be on a low protein diet.   EXAMINATION:  CONSTITUTIONAL:  He is a middle aged man not in  acute  distress  VITAL SIGNS:  Temperature 98.4, pulse 80, respiratory rate 19, blood  pressure 130/90.  SKIN:  Jaundiced.  Not pale.  NECK:  Supple.  HEENT:  Pupils are equal, round, and reactive to light and  accommodation.  Head is normocephalic, atraumatic.  Mucous membranes are  moist.  HEART:  Sounds 1 and 2 regular.  CHEST:  Clinically clear.  ABDOMEN:  He has mild right hypochondrial tenderness.  No organomegaly.  No ascites.  NEUROLOGIC:  He is alert, oriented x3, no asterixis.  Cranial nerves 2-  12 are intact.  Power is 5 globally.  EXTREMITIES:  Peripheral pulses present.  Deep tendon reflexes 2  globally.  No pedal edema.  No erythema or cyanosis.   LABS:  The AST is 154, ALT 143.  Ammonia level is 39.  The WBC is 4.5,  hematocrit 38, platelet count is 126,000.  Lipid panel is normal.  ESR  is 4.   FINAL DISCHARGE DIAGNOSES:  To be dictated.      Herbie Saxon, MD  Electronically Signed     MIO/MEDQ  D:  01/01/2008  T:  01/01/2008  Job:  [id number]

## 2010-10-27 NOTE — Discharge Summary (Signed)
NAME:  Brent Gay, PUTZIER NO.:  [id number]   MEDICAL RECORD NO.:  [id number]          PATIENT TYPE:  INP   LOCATION:  1509                         FACILITY:  [facility]   PHYSICIAN:  Herbie Saxon, MDDATE OF BIRTH:  28-Dec-1965   DATE OF ADMISSION:  12/27/2007  DATE OF DISCHARGE:  01/02/2008                               DISCHARGE SUMMARY   ADDENDUM:   CONDITION ON DISCHARGE:  Stable.   DIET:  Low-sodium, heart-healthy.   ACTIVITY:  No restrictions.   DISCHARGE MEDICATIONS:  1. Metoprolol 25 mg daily.  2. Librium 25 mg daily for 3 more day  3. Carafate 1 gm twice daily.  4. Multivitamin 1 tablet daily.  5. Thiamine 100 mg daily.  6. Prilosec 20 mg daily.  7. Coumadin 0.4 mg b.i.d.  8. Percocet 5/325 one mg daily.   FOLLOW UP:  1. Follow up with primary care physician of patient's choice.  He is      to have repeat LFTs and ammonia level by the primary care physician      in the next 1 week.  2. Follow up with Dr. Matthias Hughs in the Gastroenterology Unit in 4-6      weeks.      Herbie Saxon, MD  Electronically Signed     MIO/MEDQ  D:  01/02/2008  T:  01/02/2008  Job:  [id number]
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
## 2010-10-27 NOTE — Discharge Summary (Signed)
NAME:  RAYMUND, MANRIQUE NO.:  [id number]   MEDICAL RECORD NO.:  [id number]          PATIENT TYPE:  INP   LOCATION:  4703                         FACILITY:  [facility]   PHYSICIAN:  Manning Charity, MD     DATE OF BIRTH:  1966/02/06   DATE OF ADMISSION:  04/03/2008  DATE OF DISCHARGE:  04/06/2008                               DISCHARGE SUMMARY   DISCHARGE DIAGNOSES:  1. Alcohol intoxication.  2. History of alcohol abuse.  3. History of tobacco abuse.  4. Nausea and vomiting with a normal lipase during this admission.  5. History of elevated transaminases.  6. History of thrombocytopenia.  7. History of acute alcoholic hepatitis.  8. History of ammonemia.  9. History of hyponatremia and hypokalemia.  10.Borderline hypertension during this admission.   MEDICATION ON DISCHARGE:  1. Multivitamin 1 tablet p.o. daily.  2. Thiamine 100 mg p.o. daily.  3. Ibuprofen 200 mg 1 tablet p.o. q.6 h. p.r.n. headache.  4. Omeprazole 20 mg p.o. daily.   The patient was advised not to take Tylenol.   He will have an appointment with Dr. Sunnie Nielsen on April 24, 2008, at  3:00 p.m.  At that time, Dr. Sunnie Nielsen should check a CBC to follow the  platelet count and also a BMET to follow potassium.   PROCEDURE:  The patient had an ultrasound of his abdomen on April 09, 2008, showing increased liver echotexture, gallbladder wall thickened  (nonspecific), and no ascites.  He also had a chest x-ray showing no  acute cardiopulmonary disease.   CONDITION ON DISCHARGE:  Improved.   BRIEF ADMITTING HISTORY OF PRESENT ILLNESS:  The patient is a 45-year-  old man with past medical history significant for heavy alcohol abuse  with history of DTs and acute alcoholic hepatitis in the past,  presenting with alcohol intoxication.  The patient called 911 on the  night of admission due to alcohol poisoning.  At that time, he said  that he has been drinking vodka continuously for several days.   He  denied attempting to harm himself, and he called EMS because he does  not want to die.  He was complaining of anorexia, (did not eat in 3  days), nausea, and vomiting.  He reports drinking one and a half fifth  of vodka per day over the last week.   He has no known drug allergies.   PHYSICAL EXAMINATION:  VITAL SIGNS:  Temperature 99.2, blood pressure  162/110 with a pulse of 152 by EMS.  While in the emergency room, his  blood pressure was 157/101 with a pulse of 120.  Respiration rate 24,  oxygen saturation 96% on room air.  GENERAL:  He was alert, oriented, but smelled of alcohol.  HEENT:  Eyes, bloodshot, PERRLA.  Extraocular movements intact.  ENT,  dry mucous membranes, mild oropharyngeal erythema.  NECK:  Supple.  No carotid bruits.  RESPIRATORY:  Clear to auscultation bilaterally.  No rales, no rhonchi,  no wheezing.  CARDIOVASCULAR:  S1 and S2.  Tachycardia.  No murmurs, rubs, or gallops.  GASTROINTESTINAL:  Soft, obese, nontender, nondistended.  No  organomegaly.  EXTREMITIES:  No edema.  GENITOURINARY:  No CVA tenderness.  SKIN:  No jaundice.  No cyanosis.  NEUROLOGIC:  Motor 5/5.  Sensation intact to light touch.  Cerebellar  intact by finger-to-nose.  Moving all four limbs.  Alert and oriented  x3.  PSYCH:  Appropriate.   LABORATORY:  Sodium 137, potassium 3.6, chloride 100, bicarb 13 with an  anion gap of 24, BUN 5, creatinine 1.02, and glucose 71.  Bilirubin 1.0,  alk phos 69, AST 59, ALT 39, protein 7.5, albumin 3.9, calcium 8.5,  magnesium 1.6.  Lipase 36, white count 6.7 with an ANC of 4.5.  Hemoglobin 17.5, hematocrit 51.7, platelets 168, MCV 98.2, and RDW 14.5.  Alcohol level of 308, lipase 36, salicylate less than 4, lactic acid  4.9.  Urinalysis showing 3-6 red blood cells, ketones more than 80,  trace blood, 100 protein.  Serum osmolality was 347 with osmolal gap of  0.  Ketones in blood were small.  HIV nonreactive.  Hemoglobin A1c 5.4%.  Coags  normal.  UDS negative.  FOBT negative.  Phosphorus 2.5.  Of note,  his delta-delta was 1.5 denoting pure anion gap metabolic acidosis.   ASSESSMENT AND PLAN:  The patient is a 45 year old white man with a  history of alcohol abuse presenting with alcohol intoxication and nausea  and vomiting.  1. Alcohol intoxication with anion gap metabolic acidosis, likely      secondary to alcohol abuse (but also starvation in the last week      and increased lactic acid). The patient was started on D5 normal      saline and was supplemented with potassium.  He was started on      ondansetron for nausea and vomiting, thiamine and folate (banana      bag initially, and then continued on multivitamin and daily      thiamine and folate tablets).  He was placed on CIWA protocol with      scheduled Ativan, and the social worker consultation was called for      advice about alcohol cessation group.  2. Hypokalemia:  This was not evident on the first BMET but became      evident on subsequent ones.  It was repleted.  The magnesium was      low and was also repleted.  3. Hypertension:  This is a new diagnosis for the patient.  His blood      pressure was high in the hospital; however, this should be followed      in outpatient and if persists he should be started on blood      pressure medications.  4. Transaminitis:  This is likely secondary to alcohol-induced liver      disease.  He does not have frank cirrhosis per ultrasound of the      abdomen and no ascites; however, the liver echotexture is coarse      and is likely the cause for his elevated liver enzymes.  5. Thrombocytopenia:  His platelet counts started to fall after      admission with the lowest at 77, which was stable on the day of      discharge.  This should be followed in outpatient, however, likely      this is due to alcoholic liver dysfunction and bone marrow      suppression.  6. Tobacco abuse:  He was provided with nicotine patch and  had a  smoking cessation consult.  For prophylaxis, we kept him on SCDs for DVT prophylaxis and Protonix  for GI prophylaxis.   LABORATORY AT DISCHARGE:  White count 6.7, hemoglobin 14.5, and  platelets 89.  Sodium 138, potassium 3.9, chloride 100, bicarb 28, BUN  5, creatinine 0.79, and glucose 93.   His temperature was 99.1, blood pressure 120-155/87-106, pulse 96-101,  respirations 20, and oxygen saturation 94-100% on room air.      Carlus Pavlov, M.D.  Electronically Signed      Manning Charity, MD  Electronically Signed    CG/MEDQ  D:  04/15/2008  T:  04/15/2008  Job:  [id number]

## 2010-10-27 NOTE — Discharge Summary (Signed)
NAME:  Brent Gay, Brent Gay NO.:  [id number]   MEDICAL RECORD NO.:  [id number]          PATIENT TYPE:  INP   LOCATION:  1510                         FACILITY:  [facility]   PHYSICIAN:  Michelene Gardener, MD    DATE OF BIRTH:  [birth year]   DATE OF ADMISSION:  03/09/2008  DATE OF DISCHARGE:  03/12/2008                               DISCHARGE SUMMARY   DISCHARGE DIAGNOSES:  1. Chest pain most likely secondary to musculoskeletal causes.  2. Alcohol withdrawal.  3. Hypertension.  4. Tobacco abuse.  5. Hyponatremia.  6. Hypokalemia.  7. Medical noncompliance.   DISCHARGE MEDICATIONS:  1. Multivitamin 1 tablet p.o. once daily.  2. Folic acid 1 mg p.o. once daily.  3. Thiamine 100 mg once a day.  4. Metoprolol 25 mg twice daily.   CONSULTATIONS:  None.   PROCEDURES:  1. Chest x-ray September 26th showed no acute abnormalities.  2. Echocardiogram on September 28th showed ejection fraction 35-60%      with no left wall motion abnormalities.   COURSE OF HOSPITALIZATION:  1. Chest pain.  This is most likely secondary to musculoskeletal      causes.  The patient was admitted to the hospital for further      evaluation.  The patient was put in telemetry.  Three sets of      troponin and cardiac enzymes were done and they came to be normal.      Serial EKG showed no evidence of acute ischemia.  An echocardiogram      was done and showed no wall motion abnormalities.  Initially, the      patient was started on aspirin, metoprolol, nitroglycerin and subcu      heparin.  The patient had tenderness with palpation which will      suggest musculoskeletal component.  At this time, I do not think he      needs further intervention.  I spoke to him in detail and advised      him to come to the ER or to see his primary doctor if he developed      more chest pain or increasing shortness of breath.  2. Alcohol withdrawal.  This patient is known for heavy alcohol      drinking.  He has been  having heavy drinking for the last 2 weeks      prior to his admission.  He was put on multivitamin, folic acid,      and thiamine.  He was started on Ativan protocol.  During the      hospital, he developed moderate withdrawal symptoms.  At the time      of discharge he was back totally to normal.  I had a prolonged      discussion with him regarding alcohol withdrawal and he says he      will try to quit by himself and he does not need additional advice      or help at this time.  3. Hypokalemia that was secondary to vomiting and that was corrected  with potassium chloride.  4. Hyponatremia that was again secondary to vomiting, again that was      corrected with IV fluids.  5. Hypertension.  The patient used to be on metoprolol but he was not      compliant with his medications.  Blood pressure was high in the      hospital and he was started on metoprolol.  He was given      prescription for metoprolol and he was advised about compliance.  6. Tobacco abuse.  Again he was advised about the risk.   DISPOSITION:  Otherwise, his medical condition remained stable.   TOTAL ASSESSMENT TIME:  Forty minutes.      Michelene Gardener, MD  Electronically Signed     NAE/MEDQ  D:  03/12/2008  T:  03/12/2008  Job:  [id number]

## 2010-10-27 NOTE — Discharge Summary (Signed)
NAME:  Brent Gay, Brent Gay NO.:  [id number]   MEDICAL RECORD NO.:  [id number]          PATIENT TYPE:  INP   LOCATION:  1517                         FACILITY:  [facility]   PHYSICIAN:  Hillery Aldo, M.D.   DATE OF BIRTH:  1966/02/12   DATE OF ADMISSION:  05/04/2008  DATE OF DISCHARGE:                               DISCHARGE SUMMARY   PRIMARY CARE PHYSICIAN:  None.   DISCHARGE DIAGNOSES:  1. Acute pancreatitis.  2. Alcohol dependency.  3. Alcohol intoxication.  4. Thrombocytopenia.  5. Transient hematemesis with no evidence of ongoing gastrointestinal      bleed.  6. Hypertension.  7. Hyperbilirubinemia.  8. Tobacco abuse.  9. Obesity.  10.Fatty liver.  11.Metabolic acidosis.   DISCHARGE MEDICATIONS:  To be dictated at the time of discharge.   CONSULTATIONS:  None.   BRIEF ADMISSION HISTORY OF PRESENT ILLNESS:  The patient is a 45-year-  old male with alcohol dependency who presented to the hospital with  chief complaint of generalized abdominal pain associated with nausea,  vomiting and hematemesis x3 episodes.  Upon initial evaluation in the  emergency department, he had an elevated lipase consistent with acute  pancreatitis and was, therefore, admitted for further evaluation and  workup.  For the full details, please see my dictated report.   PROCEDURES AND DIAGNOSTIC STUDIES:  1. Acute abdominal series on May 04, 2008, showed no acute      findings in chest or abdomen.  2. CT scan of the abdomen and pelvis on May 05, 2008, showed very      mild hazy stranding along the pancreatic head and duodenum      consistent with mild pancreatitis and/or secondary duodenitis.  No      evidence of pseudocyst.  No evidence of abscess or secondary      vascular complication.  Diffuse fatty infiltration of the liver.      Hepatic splenic calcified granulomas.  There were no acute      intrapelvic findings.   DISCHARGE LABORATORY VALUES:  To be dictated at  time of discharge.   HOSPITAL COURSE BY PROBLEM:  1. Acute pancreatitis:  The patient was admitted and put on bowel rest      and IV fluids.  He was provided with Dilaudid for pain relief.  As      his lipase normalized, his diet was slowly advanced from clear      liquids to a low-fat diet.  At this point, we have discontinued all      IV narcotics and are monitoring him for tolerance of the      advancement of his diet to solid foods.  He has been somewhat      medication seeking throughout his hospital stay, and limits have      had to be set regarding IV pain medications.  Nevertheless, his IV      pain medications have now been stopped, and it is hoped that he      will tolerate his diet advancement and be able to be discharged  home tomorrow.  2. Alcohol dependency/intoxication:  The patient did have alcohol      intoxication on presentation.  He has a known heavy alcohol use and      dependency.  He was put on alcohol withdrawal protocol, and Ativan      was provided along with supplements with thiamine and folic acid.      He will need an ADS referral at discharge.  3. Metabolic acidosis:  The patient initially presented with a bicarb      of 12.  With IV fluid rehydration, this slowly resolved.  This was      a non-anion gap acidosis and likely due to a combination of      elevated lactic acid and ketones in the blood.  Dextrose was added      to his IV fluids and at this point, the patient's bicarb has      normalized, and he no longer has evidence of ketosis.  This was      likely a starvation ketosis and lactic acidosis from alcohol abuse.  4. Thrombocytopenia:  This is likely due to the toxic effect of      alcohol on his bone marrow.  The patient has had no signs or      symptoms of ongoing bleeding.  5. Transient hematemesis:  In the setting of active nausea and      vomiting, this was most likely a Mallory-Weiss tear.  The patient      had no significant elevation  of his BUN to suggest ongoing upper GI      blood loss.  He had no blood in the stools.  His hemoglobin and      hematocrit have been followed serially and have remained stable.  6. Hypertension:  The patient was initially put on a clonidine patch.      We are titrating his clonidine up for better blood pressure control      and have switched him over to p.o. clonidine.  7. Hyperbilirubinemia:  Likely due to alcohol abuse.  Over the course      of the patient's hospital stay, his bilirubin has returned to      normal.  8. Tobacco abuse:  The patient was provided with a nicotine patch and      counseled on the importance of cessation.   DISPOSITION:  The patient will likely be stable for discharge on  May 09, 2008, if he is able to tolerate advancement of his diet.  Discharge summary addendum will be dictated at that time.      Hillery Aldo, M.D.  Electronically Signed     CR/MEDQ  D:  05/08/2008  T:  05/08/2008  Job:  [id number]

## 2010-10-27 NOTE — Discharge Summary (Signed)
NAME:  Brent Gay, Brent Gay NO.:  [id number]   MEDICAL RECORD NO.:  [id number]          PATIENT TYPE:  INP   LOCATION:  1517                         FACILITY:  [facility]   PHYSICIAN:  Unk Pinto, MD      DATE OF BIRTH:  30-Apr-1966   DATE OF ADMISSION:  05/04/2008  DATE OF DISCHARGE:  05/11/2008                               DISCHARGE SUMMARY   ADDENDUM:  This is an addendum to the discharge summary already dictated  by Dr. Darnelle Catalan.   Date of dischare 05/11/2008   PRIMARY CARE PHYSICIAN:  None.  The patient was provided a phone number  to choose his primary care physician.  Phone number is [phone].   DISCHARGE DIAGNOSES:  Acute Pancreatitis,alcohol dependence, alcohol  intoxication, thrombocytopenia, hypertension, hyperlipidemia, tobacco  abuse, obesity, fatty liver, metabolic acidosis, chronic pain.   DISCHARGE MEDICATIONS:  1. Amlodipine 5 mg p.o. daily.  2. Clonidine 0.2 mg p.o. b.i.d.  3. Nicotine patch 25 mcg every 24 hours.  4. Protonix 40 mg daily.  5. Thiamine 100 mg daily.  6. Folic acid 1 mg p.o. daily.   HISTORY:  Refer to previous discharge summary.   DATA:  Laboratory values include vitamin B12 is 833, magnesium is 1.9,  hemoglobin 14, WBC 6.2, MCV 100.6, platelets 154, RDW 16.7, sodium 135,  potassium 3.7, chloride 102, bicarb 31, glucose 109, BUN 1, creatinine  0.75.  Lipase on November 25 is 55.   DISPOSITION:  The patient will be discharged home on May 11, 2008.  The patient is advised no smoking, no alcohol, no drug use.  Follow up  with primary care physician.  According to the patient, his father is a  doctor and he is the one who take cares of him.      Unk Pinto, MD  Electronically Signed     KP/MEDQ  D:  05/11/2008  T:  05/11/2008  Job:  [id number]
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
## 2010-10-27 NOTE — Discharge Summary (Signed)
NAME:  Brent Gay, Brent Gay NO.:  [id number]   MEDICAL RECORD NO.:  [id number]          PATIENT TYPE:  INP   LOCATION:  1444                         [facility]ACILITY:  [facility]   PHYSICIAN:  Herbie Saxon, MDDATE OF BIRTH:  Oct 17, 1965   DATE OF ADMISSION:  10/19/2007  DATE OF DISCHARGE:  10/24/2007                               DISCHARGE SUMMARY   DISCHARGE DIAGNOSES:  1. Severe metabolic acidosis, resolved.  2. Alcohol intoxication and dependence.  3. Urinary tract infection.  4. Leukocytosis, improved.  5. Hypertension, stable.  6. Tachycardia, resolved.  7. Thrombocytopenia.  8. Acute on chronic liver disease.  9. Elevated ammonia, resolved.  10.Hypokalemia, repleted.  11.Tobacco abuse.  12.Rhabdomyolysis, resolved.  13.Hypocalcemia .  14.Hypoalbuminemia secondary to malnutrition.  15.Mild anemia.   CONSULTS:  Dr. Electa Sniff, Psychiatry.   RADIOLOGY:  The chest x-ray of Oct 21, 2007, shows patchy atelectasis at  the left lung base.  Abdominal ultrasound of Oct 19, 2007, shows dense  homogeneous liver consistent with hepatic cellular disease, splenic  adenoma, no ascites.  Normal gallbladder and bile ducts.  Abdominal x-  ray of Oct 19, 2007, showed no specific abdominal findings.   HOSPITAL COURSE:  This 45 year old male presented to the emergency room  of [facility] on Oct 19, 2007, with abdominal pain, nausea and vomiting  which had been ongoing for 2 days.  Has a history of heavy alcohol  abuse, drinking a fifth of liquor daily for the last 1 month.  On  presentation patient had severe metabolic acidosis with a bicarbonate of  7.  He has failed alcohol rehab programs previous to this.  He was  admitted to the intensive care unit, started on IV fluid hydration with  bicarbonate drip, calcium was being supplemented, started on beta-  blocker therapy as there was no history of moderately elevated blood  pressure or tachycardia, on the Ativan protocol for delirium  tremens.  Chest x-ray was negative.  His ammonia level was noted to be elevated at  62, by Oct 21, 2007, he was started on low-dose lactulose, this has  resolved.  He has been given Ultram 50 mg q.8h. p.r.n. for body aches  and cramps.  Hemoccult stayed stable, hematocrit is also stable.  Hypokalemia has been repleted.  Psychiatry, Dr. Electa Sniff, reviewed the  patient and he is recommending outpatient alcohol detox.  Prior to  discharge patient was noted to be having borderline hypertension, his  beta-blocker dose has been reduced, antibiotic has been changed to the  oral form.  Please note that the urinalysis showed urinary tract  infection, he was started on IV Rocephin, this has been switched to  CIPRO p.o. on discharge.  Patient,s illness, medication and tests in  treatment and discharge plan were discussed with his father, Visente Kirker,  in front of patient and I verbalized the importance of alcohol sobriety.  His Hemoccult is to be repeated after IV fluid bolus and blood pressure  recheck today.   DISCHARGE CONDITION:  Stable.   DIET:  Heart healthy low cholesterol.   ACTIVITY:  No restrictions.  FOLLOWUP:  With his primary care physician Dr. Merlene Laughter at  [facility] in 3 to 5 days, to enroll in detox  voluntarily  at [facility] in 1 to 2 weeks.   MEDICATIONS ON DISCHARGE:  1. Metoprolol 12.5 mg daily.  2. Carafate 1 g q.6h.  3. Multivitamin one tablet daily.  4. Calcium carbonate one tablet daily.  5. Prilosec 20 mg b.i.d.  6. Ceftin 500 mg t.i.d. for 3 more days.  7. Librium 25 mg b.i.d. for 3 more days.  8. Thiamine 100 mg daily.   EXAMINATION:  Today, he is a young man not in acute distress,  temperature 98, pulse is 80, respiratory rate 20, blood pressure 110/80.  PUPILS:  Equal, reactive to light and accommodation, no jaundice.  Mildly pale.  OROPHARYNX AND NASOPHARYNX:  Clear.  MUCOUS MEMBRANES:  Moist.  NECK:  Supple.  No  elevated JVD.  CHEST:  Clinically clear.  HEART SOUNDS:  One and two, regular rate and rhythm.  ABDOMEN:  Benign.  He is alert, oriented in time, place and person.  Peripheral pulse is  present, no pedal edema.  Deep tendon reflex is 2 globally, power 5  globally.  Normal gait.  No dysmetria, no dysarthria.  No skin rash and  no joint swelling.   LAB TEST:  Shows potassium is 3.9, sodium 140, chloride 102, bicarbonate  32, glucose 131, BUN 1, creatinine 0.7, ammonia level is 28.  Urine  culture negative so far, blood culture negative so far.  WBC is 8,  hematocrit 35, platelet count is 102.   Discharge greater than 30 minutes.   Hepatitis B and C are negative.      Herbie Saxon, MD  Electronically Signed     MIO/MEDQ  D:  10/24/2007  T:  10/24/2007  Job:  [id number]

## 2010-10-27 NOTE — Consult Note (Signed)
NAME:  Brent Gay, Brent Gay NO.:  [id number]   MEDICAL RECORD NO.:  [id number]          PATIENT TYPE:  INP   LOCATION:  1444                         [facility]ACILITY:  [facility]   PHYSICIAN:  Anselm Jungling, MD  DATE OF BIRTH:  1965/09/02   DATE OF CONSULTATION:  10/23/2007  DATE OF DISCHARGE:                                 CONSULTATION   IDENTIFYING DATA AND REASON FOR REFERRAL:  The patient is a 45 year old  unmarried Caucasian male admitted on Oct 19, 2007, requesting alcohol  detoxification.  Psychiatric consultation is requested at this time to  assist with disposition and aftercare planning.   HISTORY OF THE PRESENTING PROBLEMS:  The patient indicates that he has  had longstanding alcohol issues, and has had various short lived periods  of sobriety.  He has gotten sober and worked at sobriety and recovery  through [employer], and through a faith-based program at [employer].  He has many friends in [place] in different states, but none  locally.   He notes that he has difficulty staying sober for more than 3-4 months  at a time.  He states that he is not sure why, but he indicates complete  acceptance of his powerlessness at this time.   The patient has been on a Librium protocol, and has nearly completed  with the detoxification process.  His admission laboratory studies  showed some initial transaminase elevation, which appears to be  resolving.  In addition, his ammonia level was elevated but it is down  within normal limits.  He is being treated for urinary tract infection.  He has some alcoholic gastritis, and is currently on Carafate for this.  It is anticipated that he will be discharged sometime in the next 24-48  hours.   Clinical social work has discussed aftercare options with the patient  including information from the [facility] Substance Abuse Teacher, English as a foreign language.   MENTAL STATUS AND OBSERVATIONS:  The patient is a well-nourished,  normally-developed adult male who is alert, fully oriented, pleasant and  cooperative.  He is a good historian.  There is nothing to suggest any  underlying psychosis, thought disorder, delirium, cognitive or memory  impairment.   IMPRESSION:  AXIS I:  Alcohol dependence, chronic, early remission and  alcohol withdrawal disorder, resolving.  AXIS II:  Deferred.  AXIS III:  Urinary tract infection, alcoholic gastritis.  AXIS IV:  Stressors severe.  AXIS V:  GAF 55.   RECOMMENDATIONS:  I discussed with the patient different aftercare  options that might be available to him, including the [facility]  intensive outpatient chemical dependency program, and one local private  such program.  In addition, he already has the [facility] Substance  Abuse [facility] packet.  He indicates that he has a lot of supports with  family locally, and with friends that he has in alcoholics anonymous.  He indicates that he will probably recontact some of these individuals.   He is very interested in an intensive outpatient program, although it is  not clear that he has the financial means  for this.  He is not opposed  to the idea of inpatient alcohol rehab, but his financial situation and  insurance situation would probably preclude this.   Nonetheless, the patient appears to have a good recognition of his  powerlessness over alcohol and his need to work on long-term and  continuous abstinence and sobriety.  He appeared to be grateful for the  information that I was able to provide today.   I agree with his current management here.  Please contact me if I can be  of further service.      Anselm Jungling, MD  Electronically Signed     SPB/MEDQ  D:  10/23/2007  T:  10/23/2007  Job:  [id number]

## 2011-01-01 IMAGING — CT CT ANGIO CHEST
2 of 6 series · 20 of 36 positions shown · IV contrast (APPLIED)
Comparison: 11/02/2009 chest radiograph.

CLINICAL DATA: Positive D-dimer.  Chest pain.

CT ANGIOGRAPHY CHEST WITH CONTRAST
TECHNIQUE: Multidetector CT imaging of the chest was performed
using the standard protocol during bolus administration of
intravenous contrast.  Multiplanar CT image reconstructions
including MIPs were obtained to evaluate the vascular anatomy.
Contrast:  100 ml Umnipaque-LAA IV.

[Series 8: pulm embolism 1.0 b25f thins · axial · 0.73mm/px · z∈[-249,-40]mm · 19 of 233 slices shown]
[im 12/233  lung]
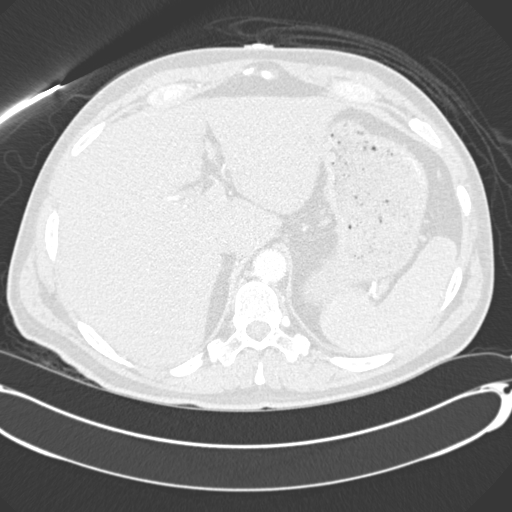
[im 24/233  mediastinal]
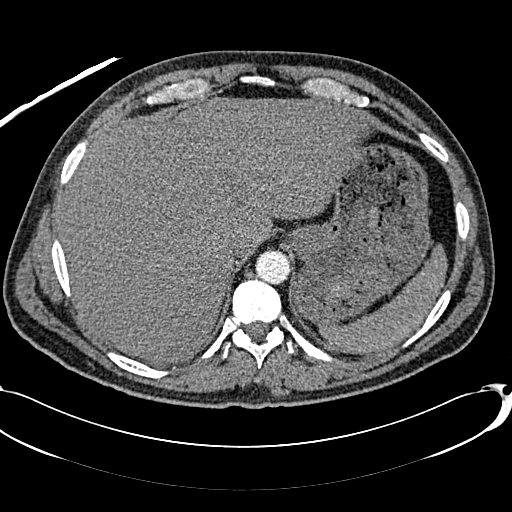
[im 35/233  lung]
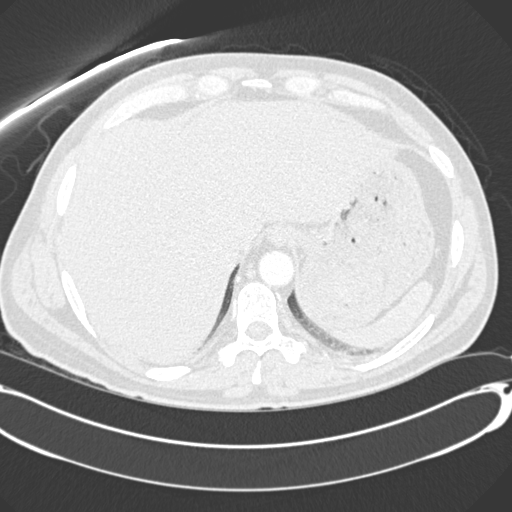
[im 47/233  mediastinal]
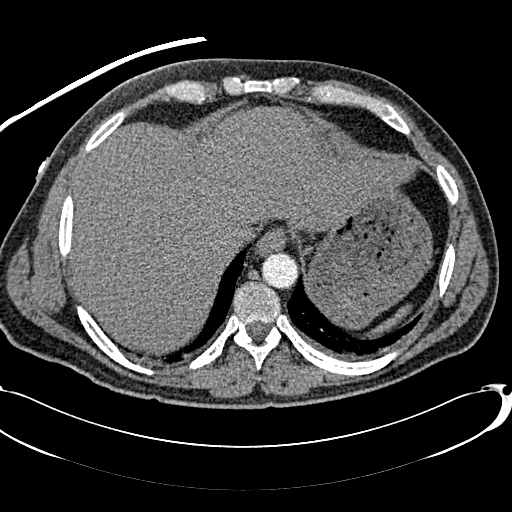
[im 59/233  lung]
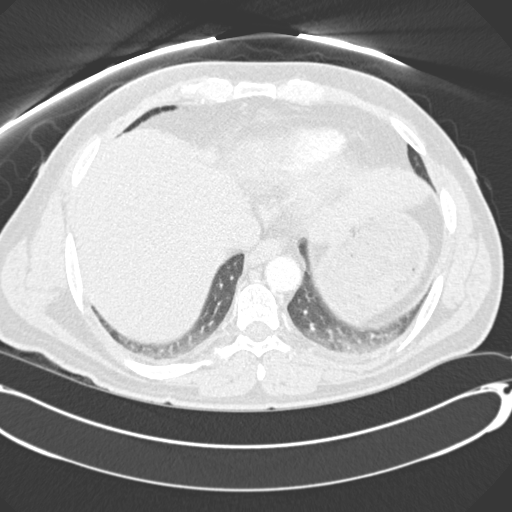
[im 70/233  mediastinal]
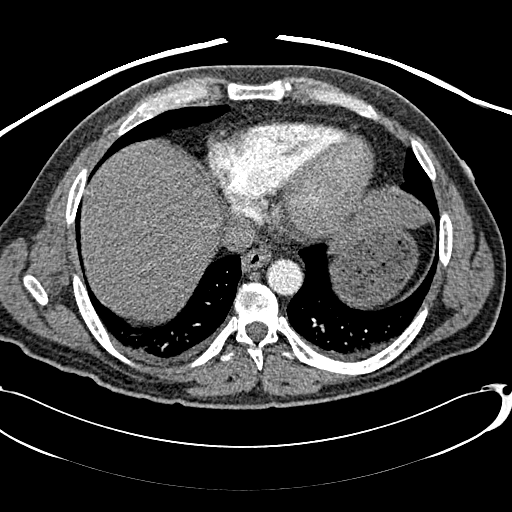
[im 82/233  lung]
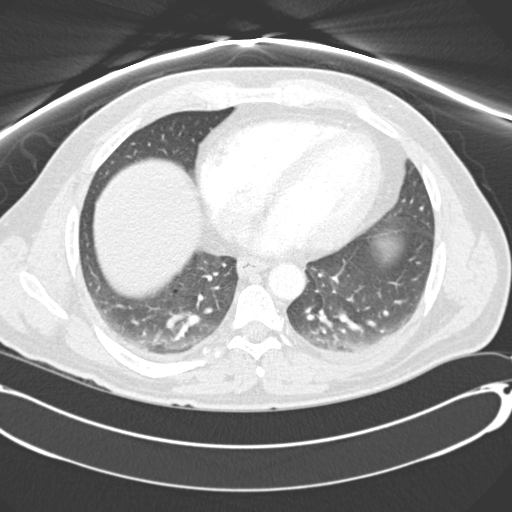
[im 93/233  mediastinal]
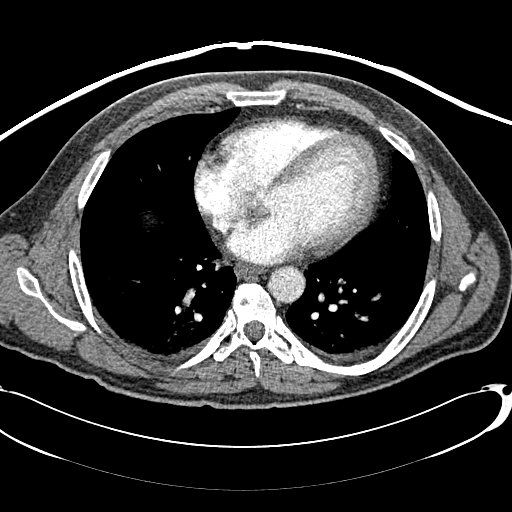
[im 105/233  lung]
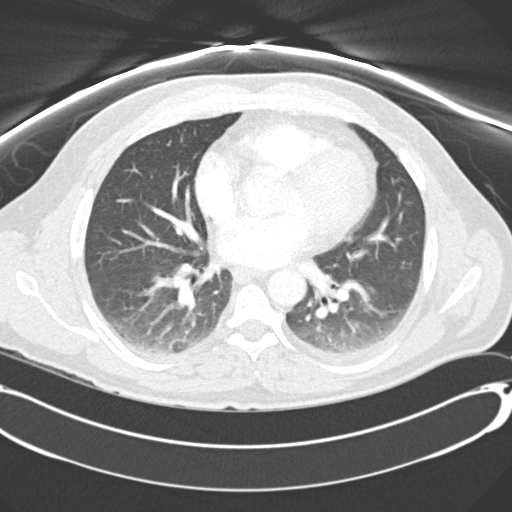
[im 117/233  mediastinal]
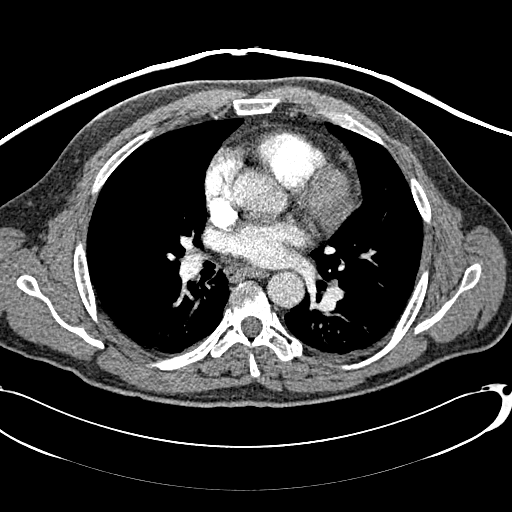
[im 128/233  lung]
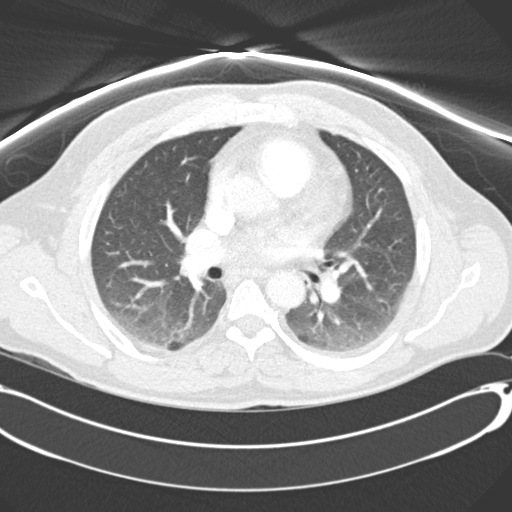
[im 140/233  mediastinal]
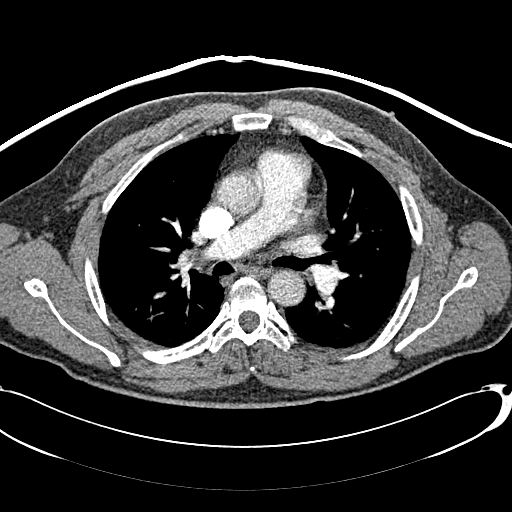
[im 151/233  lung]
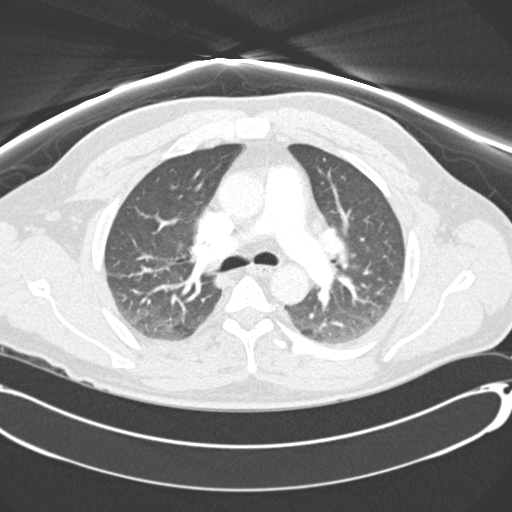
[im 163/233  mediastinal]
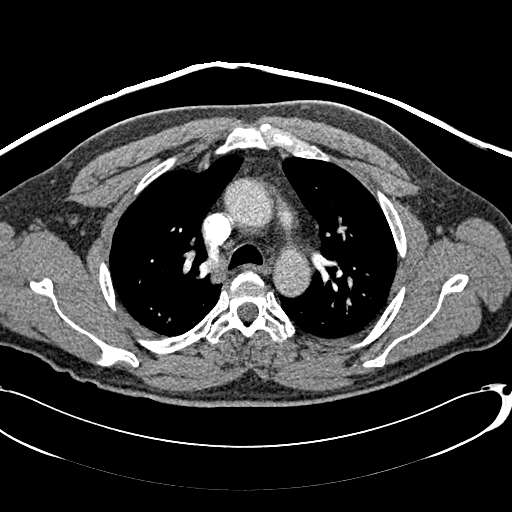
[im 175/233  lung]
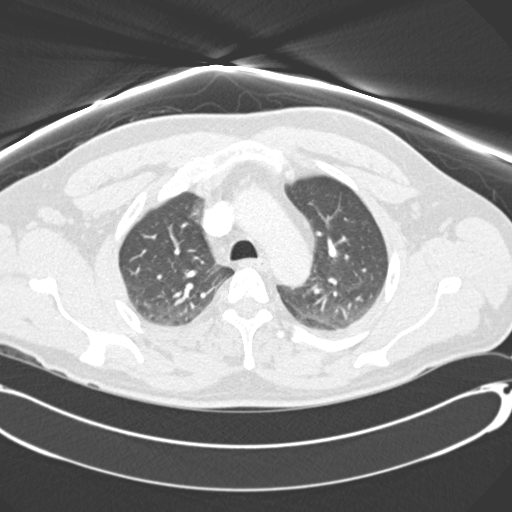
[im 186/233  mediastinal]
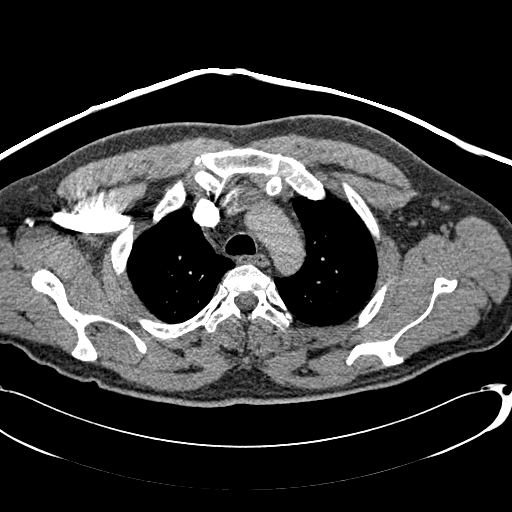
[im 198/233  lung]
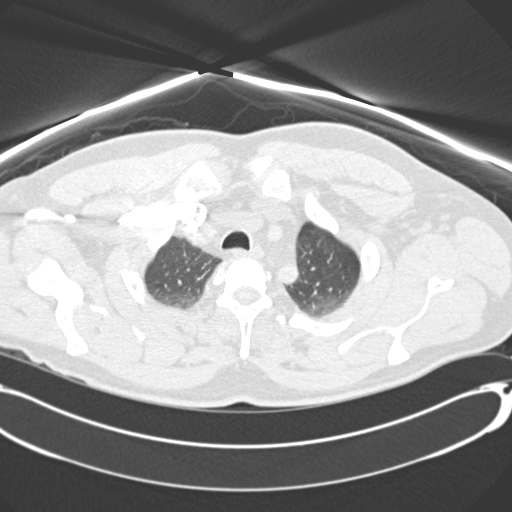
[im 209/233  mediastinal]
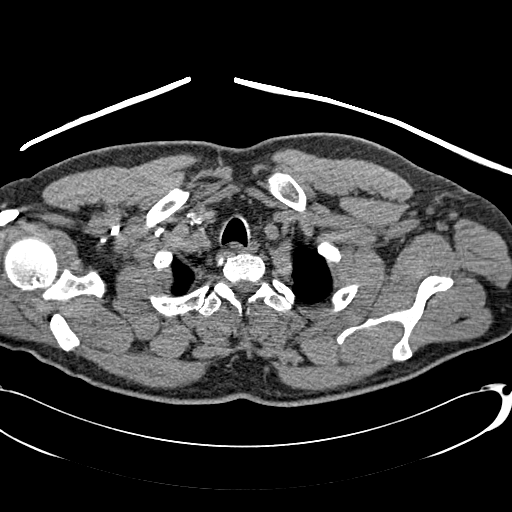
[im 221/233  lung]
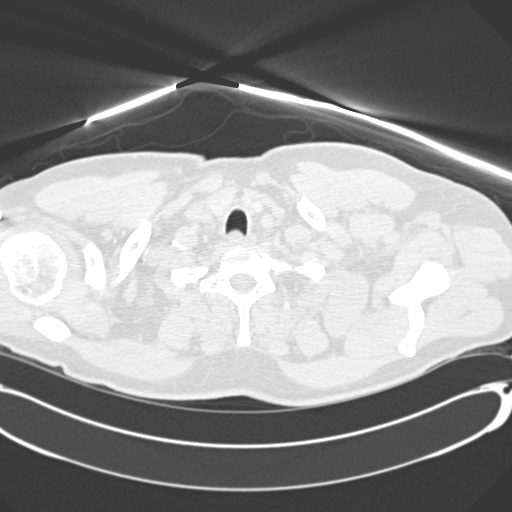

[Series 9: pulm embolism 2.0 spo cor thins · coronal · 0.73mm/px · 1 of 103 slices shown]
[im 52/103  mediastinal]
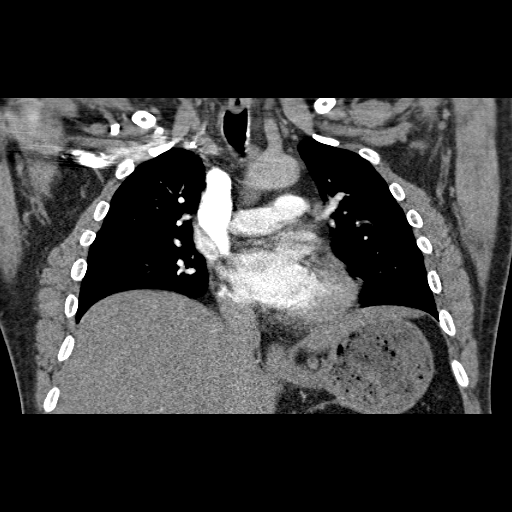

[20 of 36 positions shown; findings below may reference images not displayed]

FINDINGS: Negative for PE.  No acute aortic abnormality.  No
pathologically enlarged lymph nodes.Small pleural effusions.  Bony
thorax is intact.

Review of the MIP images confirms the above findings.
IMPRESSION: No acute chest findings.  Negative for PE.

## 2011-03-08 LAB — COMPREHENSIVE METABOLIC PANEL
ALT: 19
AST: 30
Albumin: 4.1
CO2: 15 — ABNORMAL LOW
Calcium: 8.3 — ABNORMAL LOW
Chloride: 97
GFR calc Af Amer: >60
GFR calc non Af Amer: >60
Sodium: 132 — ABNORMAL LOW

## 2011-03-08 LAB — DIFFERENTIAL
Eosinophils Absolute: 0.1
Eosinophils Relative: 2
Lymphocytes Relative: 24
Lymphs Abs: 2.1
Monocytes Absolute: 0.4

## 2011-03-08 LAB — CBC
Platelets: 209
RBC: 5.46
WBC: 8.7

## 2011-03-08 LAB — LIPASE, BLOOD: Lipase: 25

## 2011-03-08 LAB — POCT CARDIAC MARKERS
CKMB, poc: 1.4
Troponin i, poc: <0.05

## 2011-03-12 LAB — HEPATIC FUNCTION PANEL
AST: 107 — ABNORMAL HIGH
AST: 154 — ABNORMAL HIGH
Albumin: 3.1 — ABNORMAL LOW
Albumin: 3.1 — ABNORMAL LOW
Albumin: 3.8
Alkaline Phosphatase: 43
Alkaline Phosphatase: 46
Alkaline Phosphatase: 50
Alkaline Phosphatase: 54
Bilirubin, Direct: 0.4 — ABNORMAL HIGH
Bilirubin, Direct: 0.4 — ABNORMAL HIGH
Indirect Bilirubin: 0.5
Indirect Bilirubin: 0.9
Total Bilirubin: 0.5
Total Bilirubin: 1.3 — ABNORMAL HIGH
Total Bilirubin: 1.8 — ABNORMAL HIGH
Total Protein: 5.9 — ABNORMAL LOW

## 2011-03-12 LAB — POCT CARDIAC MARKERS
CKMB, poc: 1.1
CKMB, poc: <1 — ABNORMAL LOW
Myoglobin, poc: 49
Myoglobin, poc: 67.1
Operator id: 231701
Operator id: 231701
Troponin i, poc: <0.05
Troponin i, poc: <0.05

## 2011-03-12 LAB — CBC
HCT: 48.8
Hemoglobin: 13.4
Hemoglobin: 17.1 — ABNORMAL HIGH
MCHC: 34.6
MCHC: 35
MCV: 94.5
RBC: 4.03 — ABNORMAL LOW
RBC: 5.17
WBC: 6.4

## 2011-03-12 LAB — COMPREHENSIVE METABOLIC PANEL
ALT: 155 — ABNORMAL HIGH
ALT: 62 — ABNORMAL HIGH
AST: 107 — ABNORMAL HIGH
AST: 144 — ABNORMAL HIGH
Albumin: 3.7
Alkaline Phosphatase: 46
Alkaline Phosphatase: 46
CO2: 16 — ABNORMAL LOW
CO2: 26
Calcium: 8.9
Chloride: 110
Chloride: 95 — ABNORMAL LOW
Creatinine, Ser: 0.98
GFR calc Af Amer: >60
GFR calc Af Amer: >60
GFR calc non Af Amer: >60
GFR calc non Af Amer: >60
Glucose, Bld: 181 — ABNORMAL HIGH
Glucose, Bld: 75
Potassium: 3.8
Potassium: 4.1
Sodium: 136
Sodium: 142
Total Bilirubin: 1.9 — ABNORMAL HIGH
Total Protein: 6.4

## 2011-03-12 LAB — IRON AND TIBC
Iron: 45
Saturation Ratios: 19 — ABNORMAL LOW
TIBC: 232
UIBC: 187

## 2011-03-12 LAB — LIPID PANEL
Cholesterol: 127
Total CHOL/HDL Ratio: 1.7

## 2011-03-12 LAB — URINALYSIS, ROUTINE W REFLEX MICROSCOPIC
Bilirubin Urine: NEGATIVE
Nitrite: NEGATIVE
Specific Gravity, Urine: 1.009
Urobilinogen, UA: 0.2
pH: 5.5

## 2011-03-12 LAB — BLOOD GAS, ARTERIAL
Acid-base deficit: 9.2 — ABNORMAL HIGH
O2 Saturation: 92.6
TCO2: 13.4
pCO2 arterial: 30.4 — ABNORMAL LOW
pO2, Arterial: 73.6 — ABNORMAL LOW

## 2011-03-12 LAB — CALCIUM: Calcium: 8.2 — ABNORMAL LOW

## 2011-03-12 LAB — CK: Total CK: 117

## 2011-03-12 LAB — RAPID URINE DRUG SCREEN, HOSP PERFORMED
Barbiturates: NOT DETECTED
Cocaine: NOT DETECTED
Opiates: NOT DETECTED

## 2011-03-12 LAB — HEPATITIS PANEL, ACUTE: Hep B C IgM: NEGATIVE

## 2011-03-12 LAB — AMMONIA
Ammonia: 30
Ammonia: 37 — ABNORMAL HIGH
Ammonia: 39 — ABNORMAL HIGH

## 2011-03-12 LAB — DIFFERENTIAL
Basophils Absolute: 0
Eosinophils Absolute: 0.1
Eosinophils Relative: 2
Lymphocytes Relative: 30
Lymphs Abs: 2
Neutrophils Relative %: 55

## 2011-03-12 LAB — CARDIAC PANEL(CRET KIN+CKTOT+MB+TROPI): Total CK: 132

## 2011-03-12 LAB — ALT: ALT: 112 — ABNORMAL HIGH

## 2011-03-12 LAB — APTT: aPTT: 29

## 2011-03-12 LAB — LIPASE, BLOOD: Lipase: 47

## 2011-03-12 LAB — GLUCOSE, CAPILLARY: Glucose-Capillary: 78

## 2011-03-15 LAB — COMPREHENSIVE METABOLIC PANEL
ALT: 52
ALT: 32
ALT: 34
AST: 62 — ABNORMAL HIGH
AST: 39 — ABNORMAL HIGH
AST: 44 — ABNORMAL HIGH
AST: 52 — ABNORMAL HIGH
Albumin: 3.6
Albumin: 3.2 — ABNORMAL LOW
Alkaline Phosphatase: 52
Alkaline Phosphatase: 55
BUN: 1 — ABNORMAL LOW
BUN: 3 — ABNORMAL LOW
CO2: 25
CO2: 15 — ABNORMAL LOW
CO2: 27
CO2: 28
Calcium: 8.4
Calcium: 7.5 — ABNORMAL LOW
Calcium: 9.2
Chloride: 101
Chloride: 102
Chloride: 108
Creatinine, Ser: 0.78
Creatinine, Ser: 0.86
Creatinine, Ser: 1.04
GFR calc Af Amer: >60
GFR calc Af Amer: >60
GFR calc non Af Amer: >60
GFR calc non Af Amer: >60
GFR calc non Af Amer: >60
Glucose, Bld: 145 — ABNORMAL HIGH
Glucose, Bld: 73
Glucose, Bld: 93
Potassium: 3.9
Potassium: 4.3
Sodium: 141
Sodium: 138
Sodium: 138
Total Bilirubin: 0.8
Total Bilirubin: 0.9
Total Protein: 6.8
Total Protein: 6.2

## 2011-03-15 LAB — POCT I-STAT 3, ART BLOOD GAS (G3+)
Bicarbonate: 15.5 — ABNORMAL LOW
O2 Saturation: 83
O2 Saturation: 98
TCO2: 16
pCO2 arterial: 32.4 — ABNORMAL LOW
pCO2 arterial: 34.6 — ABNORMAL LOW
pH, Arterial: 7.286 — ABNORMAL LOW
pO2, Arterial: 125 — ABNORMAL HIGH

## 2011-03-15 LAB — RAPID URINE DRUG SCREEN, HOSP PERFORMED
Amphetamines: NOT DETECTED
Benzodiazepines: NOT DETECTED
Tetrahydrocannabinol: NOT DETECTED

## 2011-03-15 LAB — LIPID PANEL
Cholesterol: 110
HDL: 54
LDL Cholesterol: 72
Total CHOL/HDL Ratio: 2
Total CHOL/HDL Ratio: 2.3
Triglycerides: 49
VLDL: 10

## 2011-03-15 LAB — CBC
HCT: 44.4
HCT: 40
HCT: 40.8
HCT: 41.9
HCT: 42.3
Hemoglobin: 13.2
Hemoglobin: 14.1
Hemoglobin: 14.4
Hemoglobin: 14.5
MCHC: 34.3
MCHC: 34.3
MCHC: 34.1
MCHC: 34.5
MCHC: 34.7
MCV: 97.7
Platelets: 106 — ABNORMAL LOW
Platelets: 135 — ABNORMAL LOW
Platelets: 168
Platelets: 88 — ABNORMAL LOW
Platelets: 99 — ABNORMAL LOW
RBC: 4.18 — ABNORMAL LOW
RBC: 4.24
RDW: 13.9
RDW: 14.2
RDW: 14.4
RDW: 14.5
RDW: 14.6
WBC: 5.6
WBC: 6.1
WBC: 6.7

## 2011-03-15 LAB — URINALYSIS, ROUTINE W REFLEX MICROSCOPIC
Bilirubin Urine: NEGATIVE
Protein, ur: 100 — AB
Urobilinogen, UA: 0.2

## 2011-03-15 LAB — BASIC METABOLIC PANEL
BUN: 2 — ABNORMAL LOW
BUN: 2 — ABNORMAL LOW
BUN: 5 — ABNORMAL LOW
CO2: 24
CO2: 27
CO2: 16 — ABNORMAL LOW
Calcium: 8.4
Calcium: 8.6
Calcium: 7.7 — ABNORMAL LOW
Calcium: 8.5
Creatinine, Ser: 0.77
GFR calc Af Amer: >60
GFR calc non Af Amer: >60
GFR calc non Af Amer: >60
GFR calc non Af Amer: >60
GFR calc non Af Amer: >60
Glucose, Bld: 112 — ABNORMAL HIGH
Glucose, Bld: 89
Glucose, Bld: 71
Potassium: 2.9 — ABNORMAL LOW
Potassium: 3.6
Sodium: 137

## 2011-03-15 LAB — CARDIAC PANEL(CRET KIN+CKTOT+MB+TROPI)
CK, MB: 0.6
CK, MB: 0.7
Relative Index: 0.5
Total CK: 129
Troponin I: <0.01

## 2011-03-15 LAB — HEPATIC FUNCTION PANEL
ALT: 39
AST: 59 — ABNORMAL HIGH
Albumin: 3.9
Alkaline Phosphatase: 69
Indirect Bilirubin: 0.9
Total Protein: 7.5

## 2011-03-15 LAB — AMMONIA: Ammonia: 36 — ABNORMAL HIGH

## 2011-03-15 LAB — GLUCOSE, CAPILLARY
Glucose-Capillary: 101 — ABNORMAL HIGH
Glucose-Capillary: 128 — ABNORMAL HIGH
Glucose-Capillary: 92
Glucose-Capillary: 94

## 2011-03-15 LAB — TROPONIN I: Troponin I: 0.01

## 2011-03-15 LAB — DIFFERENTIAL
Basophils Absolute: 0
Eosinophils Absolute: 0.2
Eosinophils Relative: 5
Lymphocytes Relative: 23
Lymphs Abs: 1.6
Lymphs Abs: 1.6
Monocytes Absolute: 0.4
Neutro Abs: 4.5
Neutrophils Relative %: 68

## 2011-03-15 LAB — PHOSPHORUS
Phosphorus: 3.4
Phosphorus: 2.5

## 2011-03-15 LAB — MAGNESIUM
Magnesium: 1.7
Magnesium: 1.6

## 2011-03-15 LAB — ETHANOL: Alcohol, Ethyl (B): 308 — ABNORMAL HIGH

## 2011-03-15 LAB — ETHYLENE GLYCOL: Ethylene Glycol Lvl: NEGATIVE

## 2011-03-15 LAB — SALICYLATE LEVEL: Salicylate Lvl: <4

## 2011-03-15 LAB — TSH
TSH: 2.127
TSH: 1.681

## 2011-03-15 LAB — HEMOGLOBIN A1C: Hgb A1c MFr Bld: 5.4

## 2011-03-15 LAB — CK TOTAL AND CKMB (NOT AT ARMC): Relative Index: 0.3

## 2011-03-15 LAB — LIPASE, BLOOD: Lipase: 36

## 2011-03-15 LAB — POTASSIUM: Potassium: 3.7

## 2011-03-15 LAB — URINE MICROSCOPIC-ADD ON

## 2011-03-15 LAB — HIV ANTIBODY (ROUTINE TESTING W REFLEX): HIV: NONREACTIVE

## 2011-03-15 LAB — VITAMIN B12: Vitamin B-12: 701 (ref 211–911)

## 2011-03-15 LAB — ALCOHOL,  ISOPROPYL (ISOPROPANOL)

## 2011-03-15 LAB — OSMOLALITY
Osmolality: 317 — ABNORMAL HIGH
Osmolality: 347 — ABNORMAL HIGH

## 2011-03-15 LAB — APTT: aPTT: 27

## 2011-03-16 LAB — CBC
HCT: 37 — ABNORMAL LOW
HCT: 49.6
HCT: 36.7 — ABNORMAL LOW
HCT: 37.9 — ABNORMAL LOW
HCT: 38.8 — ABNORMAL LOW
HCT: 52.4 — ABNORMAL HIGH
Hemoglobin: 13
Hemoglobin: 13.3
Hemoglobin: 13.3
Hemoglobin: 14
Hemoglobin: 17.7 — ABNORMAL HIGH
MCHC: 33.7
MCHC: 34.2
MCHC: 34.3
MCHC: 34.4
MCV: 97.1
MCV: 98.6
MCV: 100.5 — ABNORMAL HIGH
MCV: 100.5 — ABNORMAL HIGH
MCV: 99.4
Platelets: 121 — ABNORMAL LOW
Platelets: 154
Platelets: 65 — ABNORMAL LOW
Platelets: 80 — ABNORMAL LOW
RBC: 5.03
RBC: 3.66 — ABNORMAL LOW
RBC: 3.87 — ABNORMAL LOW
RBC: 5.27
RDW: 15.7 — ABNORMAL HIGH
RDW: 16.3 — ABNORMAL HIGH
RDW: 16.7 — ABNORMAL HIGH
WBC: 12.5 — ABNORMAL HIGH
WBC: 7.4
WBC: 5.2
WBC: 5.6
WBC: 5.7

## 2011-03-16 LAB — COMPREHENSIVE METABOLIC PANEL
ALT: 40
ALT: 25
ALT: 47
AST: 41 — ABNORMAL HIGH
Alkaline Phosphatase: 50
Alkaline Phosphatase: 43
Alkaline Phosphatase: 48
BUN: 3 — ABNORMAL LOW
BUN: 6
BUN: 8
BUN: <1 — ABNORMAL LOW
CO2: 7 — CL
CO2: 12 — ABNORMAL LOW
CO2: 26
CO2: 31
Calcium: 8.6
Calcium: 8.1 — ABNORMAL LOW
Calcium: 9
Chloride: 107
Chloride: 100
Chloride: 105
Creatinine, Ser: 0.66
Creatinine, Ser: 1.17
GFR calc Af Amer: >60
GFR calc non Af Amer: 59 — ABNORMAL LOW
GFR calc non Af Amer: >60
GFR calc non Af Amer: >60
Glucose, Bld: 113 — ABNORMAL HIGH
Glucose, Bld: 106 — ABNORMAL HIGH
Glucose, Bld: 119 — ABNORMAL HIGH
Glucose, Bld: 125 — ABNORMAL HIGH
Glucose, Bld: 82
Potassium: 3.6
Potassium: 3.1 — ABNORMAL LOW
Sodium: 135
Sodium: 134 — ABNORMAL LOW
Sodium: 139
Total Bilirubin: 1.4 — ABNORMAL HIGH
Total Protein: 5.9 — ABNORMAL LOW
Total Protein: 5.9 — ABNORMAL LOW
Total Protein: 8.4 — ABNORMAL HIGH

## 2011-03-16 LAB — PROTIME-INR
INR: 1
INR: 1.1
Prothrombin Time: 13.8
Prothrombin Time: 14.2

## 2011-03-16 LAB — POCT I-STAT, CHEM 8
BUN: 5 — ABNORMAL LOW
Calcium, Ion: 0.99 — ABNORMAL LOW
Hemoglobin: 18 — ABNORMAL HIGH
Sodium: 138
TCO2: 8

## 2011-03-16 LAB — DIFFERENTIAL
Basophils Absolute: 0
Basophils Relative: 0
Basophils Relative: 1
Eosinophils Absolute: 0
Lymphocytes Relative: 10 — ABNORMAL LOW
Lymphs Abs: 1.5
Monocytes Absolute: 0.6
Neutro Abs: 10.6 — ABNORMAL HIGH
Neutro Abs: 9.2 — ABNORMAL HIGH
Neutrophils Relative %: 82 — ABNORMAL HIGH

## 2011-03-16 LAB — TROPONIN I: Troponin I: 0.02

## 2011-03-16 LAB — CARDIAC PANEL(CRET KIN+CKTOT+MB+TROPI)
Relative Index: 0.9
Relative Index: 1.4

## 2011-03-16 LAB — BASIC METABOLIC PANEL
BUN: 1 — ABNORMAL LOW
BUN: <1 — ABNORMAL LOW
CO2: 31
Chloride: 102
Creatinine, Ser: 0.75
GFR calc non Af Amer: >60
Glucose, Bld: 109 — ABNORMAL HIGH
Glucose, Bld: 155 — ABNORMAL HIGH
Potassium: 2.8 — ABNORMAL LOW
Potassium: 3.7

## 2011-03-16 LAB — GLUCOSE, CAPILLARY

## 2011-03-16 LAB — RAPID URINE DRUG SCREEN, HOSP PERFORMED
Amphetamines: NOT DETECTED
Benzodiazepines: NOT DETECTED
Cocaine: NOT DETECTED
Opiates: NOT DETECTED
Tetrahydrocannabinol: NOT DETECTED

## 2011-03-16 LAB — ACETAMINOPHEN LEVEL: Acetaminophen (Tylenol), Serum: <10 — ABNORMAL LOW

## 2011-03-16 LAB — URINALYSIS, ROUTINE W REFLEX MICROSCOPIC
Bilirubin Urine: NEGATIVE
Hgb urine dipstick: NEGATIVE
Ketones, ur: >80 — AB
Nitrite: NEGATIVE
Protein, ur: 100 — AB
Urobilinogen, UA: 0.2

## 2011-03-16 LAB — TSH: TSH: 1.561

## 2011-03-16 LAB — LIPASE, BLOOD
Lipase: 124 — ABNORMAL HIGH
Lipase: 169 — ABNORMAL HIGH
Lipase: 55
Lipase: 92 — ABNORMAL HIGH

## 2011-03-16 LAB — POCT CARDIAC MARKERS
CKMB, poc: 2
Myoglobin, poc: 79.5
Troponin i, poc: <0.05
Troponin i, poc: <0.05

## 2011-03-16 LAB — URINE MICROSCOPIC-ADD ON

## 2011-03-16 LAB — LIPID PANEL
HDL: 61
LDL Cholesterol: 84
Triglycerides: 94
VLDL: 19

## 2011-03-16 LAB — LACTIC ACID, PLASMA: Lactic Acid, Venous: 4.3 — ABNORMAL HIGH

## 2011-03-16 LAB — APTT: aPTT: 27

## 2011-03-16 LAB — KETONES, QUALITATIVE

## 2011-03-16 LAB — FOLATE RBC: RBC Folate: 968 — ABNORMAL HIGH

## 2011-08-24 DIAGNOSIS — K859 Acute pancreatitis without necrosis or infection, unspecified: Secondary | ICD-10-CM | POA: Insufficient documentation

## 2011-10-09 ENCOUNTER — Emergency Department (HOSPITAL_BASED_OUTPATIENT_CLINIC_OR_DEPARTMENT_OTHER)
Admission: EM | Admit: 2011-10-09 | Discharge: 2011-10-09 | Disposition: A | Discharge status: Rehabilitation Facility | Payer: Self-pay | Attending: Emergency Medicine | Admitting: Emergency Medicine

## 2011-10-09 ENCOUNTER — Encounter (HOSPITAL_BASED_OUTPATIENT_CLINIC_OR_DEPARTMENT_OTHER): Payer: Self-pay

## 2011-10-09 DIAGNOSIS — R0609 Other forms of dyspnea: Secondary | ICD-10-CM | POA: Insufficient documentation

## 2011-10-09 DIAGNOSIS — F10929 Alcohol use, unspecified with intoxication, unspecified: Secondary | ICD-10-CM

## 2011-10-09 DIAGNOSIS — R0989 Other specified symptoms and signs involving the circulatory and respiratory systems: Secondary | ICD-10-CM | POA: Insufficient documentation

## 2011-10-09 DIAGNOSIS — F1010 Alcohol abuse, uncomplicated: Secondary | ICD-10-CM | POA: Insufficient documentation

## 2011-10-09 DIAGNOSIS — R079 Chest pain, unspecified: Secondary | ICD-10-CM | POA: Insufficient documentation

## 2011-10-09 DIAGNOSIS — K2920 Alcoholic gastritis without bleeding: Secondary | ICD-10-CM | POA: Insufficient documentation

## 2011-10-09 DIAGNOSIS — R110 Nausea: Secondary | ICD-10-CM | POA: Insufficient documentation

## 2011-10-09 DIAGNOSIS — R61 Generalized hyperhidrosis: Secondary | ICD-10-CM | POA: Insufficient documentation

## 2011-10-09 HISTORY — DX: Essential (primary) hypertension: I10

## 2011-10-09 LAB — DIFFERENTIAL
Basophils Absolute: 0 10*3/uL (ref 0.0–0.1)
Basophils Relative: 1 % (ref 0–1)
Eosinophils Relative: 5 % (ref 0–5)
Monocytes Absolute: 0.6 10*3/uL (ref 0.1–1.0)
Neutro Abs: 2.8 10*3/uL (ref 1.7–7.7)

## 2011-10-09 LAB — RAPID URINE DRUG SCREEN, HOSP PERFORMED
Amphetamines: NOT DETECTED
Benzodiazepines: POSITIVE — AB
Cocaine: NOT DETECTED
Opiates: NOT DETECTED

## 2011-10-09 LAB — ETHANOL
Alcohol, Ethyl (B): 347 mg/dL — ABNORMAL HIGH (ref 0–11)
Alcohol, Ethyl (B): 203 mg/dL — ABNORMAL HIGH (ref 0–11)
Alcohol, Ethyl (B): 59 mg/dL — ABNORMAL HIGH (ref 0–11)

## 2011-10-09 LAB — CBC
HCT: 44.8 % (ref 39.0–52.0)
MCHC: 35.9 g/dL (ref 30.0–36.0)
MCV: 88.7 fL (ref 78.0–100.0)
Platelets: 243 10*3/uL (ref 150–400)
RDW: 13.6 % (ref 11.5–15.5)

## 2011-10-09 LAB — COMPREHENSIVE METABOLIC PANEL
AST: 22 U/L (ref 0–37)
Albumin: 3.8 g/dL (ref 3.5–5.2)
Calcium: 8.6 mg/dL (ref 8.4–10.5)
Creatinine, Ser: 0.8 mg/dL (ref 0.50–1.35)
Sodium: 143 mEq/L (ref 135–145)

## 2011-10-09 LAB — TROPONIN I: Troponin I: <0.3 ng/mL (ref <0.30)

## 2011-10-09 MED ORDER — ZOLPIDEM TARTRATE 5 MG PO TABS
5.0000 mg | ORAL_TABLET | Freq: Every evening | ORAL | Status: DC | PRN
  Filled 2011-10-09: qty 1

## 2011-10-09 MED ORDER — ONDANSETRON HCL 4 MG/2ML IJ SOLN
4.0000 mg | Freq: Once | INTRAMUSCULAR | Status: AC
  Administered 2011-10-09: 4 mg via INTRAVENOUS
  Filled 2011-10-09: qty 2

## 2011-10-09 MED ORDER — ACETAMINOPHEN 325 MG PO TABS
650.0000 mg | ORAL_TABLET | ORAL | Status: DC | PRN
  Administered 2011-10-09: 650 mg via ORAL
  Filled 2011-10-09: qty 2

## 2011-10-09 MED ORDER — LORAZEPAM 2 MG/ML IJ SOLN: 0.0000 mg | Freq: Four times a day (QID) | INTRAMUSCULAR | Status: DC

## 2011-10-09 MED ORDER — PANTOPRAZOLE SODIUM 40 MG IV SOLR
40.0000 mg | INTRAVENOUS | Status: AC
  Administered 2011-10-09: 40 mg via INTRAVENOUS
  Filled 2011-10-09: qty 40

## 2011-10-09 MED ORDER — FOLIC ACID 1 MG PO TABS: 1.0000 mg | ORAL_TABLET | Freq: Every day | ORAL | Status: DC

## 2011-10-09 MED ORDER — ONDANSETRON HCL 8 MG PO TABS
4.0000 mg | ORAL_TABLET | Freq: Three times a day (TID) | ORAL | Status: DC | PRN
  Administered 2011-10-09: 4 mg via ORAL
  Filled 2011-10-09: qty 1

## 2011-10-09 MED ORDER — LORAZEPAM 1 MG PO TABS
1.0000 mg | ORAL_TABLET | Freq: Once | ORAL | Status: AC
  Administered 2011-10-09: 1 mg via ORAL

## 2011-10-09 MED ORDER — LORAZEPAM 1 MG PO TABS
0.5000 mg | ORAL_TABLET | Freq: Once | ORAL | Status: AC
  Administered 2011-10-09: 0.5 mg via ORAL
  Filled 2011-10-09: qty 1

## 2011-10-09 MED ORDER — IBUPROFEN 400 MG PO TABS: 600.0000 mg | ORAL_TABLET | Freq: Three times a day (TID) | ORAL | Status: DC | PRN

## 2011-10-09 MED ORDER — SODIUM CHLORIDE 0.9 % IV SOLN
Freq: Once | INTRAVENOUS | Status: AC
  Administered 2011-10-09: 06:00:00 via INTRAVENOUS

## 2011-10-09 MED ORDER — SODIUM CHLORIDE 0.9 % IV BOLUS (SEPSIS)
1000.0000 mL | Freq: Once | INTRAVENOUS | Status: AC
  Administered 2011-10-09 (×2): 1000 mL via INTRAVENOUS

## 2011-10-09 MED ORDER — VITAMIN B-1 100 MG PO TABS: 100.0000 mg | ORAL_TABLET | Freq: Every day | ORAL | Status: DC

## 2011-10-09 MED ORDER — LORAZEPAM 2 MG/ML IJ SOLN
1.0000 mg | Freq: Once | INTRAMUSCULAR | Status: AC
  Administered 2011-10-09: 1 mg via INTRAVENOUS
  Filled 2011-10-09: qty 1

## 2011-10-09 MED ORDER — ADULT MULTIVITAMIN W/MINERALS CH: 1.0000 | ORAL_TABLET | Freq: Every day | ORAL | Status: DC

## 2011-10-09 MED ORDER — ALUM & MAG HYDROXIDE-SIMETH 200-200-20 MG/5ML PO SUSP: 30.0000 mL | ORAL | Status: DC | PRN

## 2011-10-09 MED ORDER — LORAZEPAM 1 MG PO TABS
ORAL_TABLET | ORAL | Status: AC
  Administered 2011-10-09: 1 mg via ORAL
  Filled 2011-10-09: qty 1

## 2011-10-09 MED ORDER — LORAZEPAM 2 MG/ML IJ SOLN: 0.0000 mg | Freq: Two times a day (BID) | INTRAMUSCULAR | Status: DC

## 2011-10-09 MED ORDER — THIAMINE HCL 100 MG/ML IJ SOLN: 100.0000 mg | Freq: Every day | INTRAMUSCULAR | Status: DC

## 2011-10-09 MED ORDER — GI COCKTAIL ~~LOC~~
30.0000 mL | Freq: Once | ORAL | Status: AC
  Administered 2011-10-09: 30 mL via ORAL
  Filled 2011-10-09: qty 30

## 2011-10-09 NOTE — ED Notes (Signed)
Pt sts have been having acid reflux in chest and hurting

## 2011-10-09 NOTE — ED Notes (Signed)
Will repeat ETOH level at 1300

## 2011-10-09 NOTE — ED Notes (Signed)
Awaiting assignment for bed placement as ETOH level has decreased to 59.

## 2011-10-09 NOTE — ED Notes (Signed)
Therapeutic alternatives representative here to see pt.  Bed is available for pt for detox as soon as ETOH level is below 200.

## 2011-10-09 NOTE — ED Provider Notes (Signed)
History     CSN: [id number]  Arrival date & time 10/09/11  0414   First MD Initiated Contact with Patient 10/09/11 [phone]      Chief Complaint  Patient presents with  . Alcohol Intoxication    (Consider location/radiation/quality/duration/timing/severity/associated sxs/prior treatment) Patient is a 46 y.o. male presenting with chest pain. The history is provided by the patient.  Chest Pain   He had onset about 6 hours ago of a sharp left lateral chest pain with some radiation through to the back. Nothing makes his pain better and nothing makes it worse. It is severe and he rates it at 9/10. He had been drinking heavily prior to onset of pain and he states he continued to drink to try and kill the pain with alcohol. He has a history of alcohol abuse and states he started drinking again about 4 months ago. His pain today it is associated with dyspnea, nausea, and diaphoresis. He has had similar pains before but he doesn't remember what he was told that caused it.  History reviewed. No pertinent past medical history.  History reviewed. No pertinent past surgical history.  History reviewed. No pertinent family history.  History  Substance Use Topics  . Smoking status: Not on file  . Smokeless tobacco: Not on file  . Alcohol Use: Yes      Review of Systems  Cardiovascular: Positive for chest pain.  All other systems reviewed and are negative.    Allergies  Review of patient's allergies indicates no known allergies.  Home Medications  No current outpatient prescriptions on file.  There were no vitals taken for this visit.  Physical Exam  Nursing note and vitals reviewed.  46 year old male who is resting comfortably and in no acute distress. Vital signs are normal. Head is normocephalic and atraumatic. PERRLA, EOMI. Oropharynx is clear. Neck is nontender and supple. Back is nontender. Lungs are clear without rales, wheezes, or rhonchi. Heart has regular rate and rhythm  without murmur. Abdomen is soft with a moderate tenderness across the upper abdomen without any rebound or guarding. There is no hepatosplenomegaly. Peristalsis is diminished. Extremities have no cyanosis or edema, full range of motion is present. Skin is warm and dry without rash. Neurologic: Mental status is significant for fracture of alcohol intoxication. Cranial nerves are intact. There no focal motor or sensory deficits.  ED Course  Procedures (including critical care time)  Labs Reviewed - No data to display No results found.   Date: 10/09/2011  Rate: 102  Rhythm: sinus tachycardia  QRS Axis: normal  Intervals: normal  ST/T Wave abnormalities: normal  Conduction Disutrbances:none  Narrative Interpretation: Q waves in lead V1, V2 with very small R wave in V3 and V4. 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 No old ECG available for comparison.  Old EKG Reviewed: none available  Patient started to feel shaky and requested a dose of Ativan. He states he has gone through DTs in the past. He is requesting detox. He denies homicidal ideation and suicidal ideation and denies hallucinations. Urine is sent for toxicology screen and when results are back, will get consultation with crisis team. Case is endorsed to Dr. Ethelda Chick.   1. Alcohol intoxication   2. Alcoholic gastritis       MDM  Chest pain which seems most likely to be from a GI origin of-is likely alcoholic gastritis. Consider pancreatitis. EKG will be checked as well as metabolic panel and lipase. He'll be given a therapeutic  trial of GI cocktail. Old records are reviewed and it is noted that he had been an inpatient at [facility] 1 year ago he had been admitted for treatment of alcohol abuse -         Brent Booze, MD 10/09/11 [phone]

## 2011-10-09 NOTE — ED Provider Notes (Addendum)
Presently complains of nausea and headache no other complaint feels it as a result of drinking too much last night patient sleeping easily arousable awake alert lungs clear to auscultation heart regular rate and rhythm abdomen obese normal bowel sounds nontender. Zofran ordered  Doug Sou, MD 10/09/11 4098    10:45 AM requesting Ativan for "my nerves" and medicine for chronic back pain Tylenol ordered Ativan 0.5 mg by mouth ordered. Patient appears calm does not appear to be actively in alcohol withdrawal Nausea is improved since treatment with Zofran  Doug Sou, MD 10/09/11 1047  4:20 PM patient is alert and with her Glasgow Coma Score 15 states he feels mildly anxious Ativan 1 mg by mouth ordered by me [facility]  Doug Sou, MD 10/09/11 1624

## 2011-10-09 NOTE — ED Notes (Signed)
[facility] notified of pt's request to talk with someone.

## 2011-10-09 NOTE — ED Notes (Signed)
Bruce, with [facility], is here to evaluate pt

## 2011-10-09 NOTE — Discharge Instructions (Signed)
Finding Treatment for Alcohol and Drug Addiction It can be hard to find the right place to get professional treatment. Here are some important things to consider:  There are different types of treatment to choose from.   Some programs are live-in (residential) while others are not (outpatient). Sometimes a combination is offered.   No single type of program is right for everyone.   Most treatment programs involve a combination of education, counseling, and a 12-step, spiritually-based approach.   There are non-spiritually based programs (not 12-step).   Some treatment programs are government sponsored. They are geared for patients without private insurance.   Treatment programs can vary in many respects such as:   Cost and types of insurance accepted.   Types of on-site medical services offered.   Length of stay, setting, and size.   Overall philosophy of treatment.  A person may need specialized treatment or have needs not addressed by all programs. For example, adolescents need treatment appropriate for their age. Other people have secondary disorders that must be managed as well. Secondary conditions can include mental illness, such as depression or diabetes. Often, a period of detoxification from alcohol or drugs is needed. This requires medical supervision and not all programs offer this. THINGS TO CONSIDER WHEN SELECTING A TREATMENT PROGRAM   Is the program certified by the appropriate government agency? Even private programs must be certified and employ certified professionals.   Does the program accept your insurance? If not, can a payment plan be set up?   Is the facility clean, organized, and well run? Do they allow you to speak with graduates who can share their treatment experience with you? Can you tour the facility? Can you meet with staff?   Does the program meet the full range of individual needs?   Does the treatment program address sexual orientation and physical  disabilities? Do they provide age, gender, and culturally appropriate treatment services?   Is treatment available in languages other than English?   Is long-term aftercare support or guidance encouraged and provided?   Is assessment of an individual's treatment plan ongoing to ensure it meets changing needs?   Does the program use strategies to encourage reluctant patients to remain in treatment long enough to increase the likelihood of success?   Does the program offer counseling (individual or group) and other behavioral therapies?   Does the program offer medicine as part of the treatment regimen, if needed?   Is there ongoing monitoring of possible relapse? Is there a defined relapse prevention program? Are services or referrals offered to family members to ensure they understand addiction and the recovery process? This would help them support the recovering individual.   Are 12-step meetings held at the center or is transport available for patients to attend outside meetings?  In countries outside of the [place]. and [place], Magazine features editor for contact information for services in your area. Document Released: 04/29/2005 Document Revised: 05/20/2011 Document Reviewed: 11/09/2007 [facility] Patient Information 2012 [place], [place].   Go directly to [facility] for detox from alcohol

## 2011-12-28 ENCOUNTER — Emergency Department (HOSPITAL_COMMUNITY)
Admission: EM | Admit: 2011-12-28 | Discharge: 2011-12-29 | Disposition: A | Discharge status: Home / Self Care | Payer: Self-pay | Attending: Emergency Medicine | Admitting: Emergency Medicine

## 2011-12-28 ENCOUNTER — Encounter (HOSPITAL_COMMUNITY): Payer: Self-pay

## 2011-12-28 ENCOUNTER — Emergency Department (HOSPITAL_COMMUNITY): Payer: Self-pay

## 2011-12-28 DIAGNOSIS — G8929 Other chronic pain: Secondary | ICD-10-CM | POA: Insufficient documentation

## 2011-12-28 DIAGNOSIS — I10 Essential (primary) hypertension: Secondary | ICD-10-CM | POA: Insufficient documentation

## 2011-12-28 DIAGNOSIS — F10929 Alcohol use, unspecified with intoxication, unspecified: Secondary | ICD-10-CM

## 2011-12-28 DIAGNOSIS — K219 Gastro-esophageal reflux disease without esophagitis: Secondary | ICD-10-CM | POA: Insufficient documentation

## 2011-12-28 DIAGNOSIS — R0789 Other chest pain: Secondary | ICD-10-CM

## 2011-12-28 DIAGNOSIS — R079 Chest pain, unspecified: Secondary | ICD-10-CM | POA: Insufficient documentation

## 2011-12-28 HISTORY — DX: Dorsalgia, unspecified: M54.9

## 2011-12-28 HISTORY — DX: Other chronic pain: G89.29

## 2011-12-28 HISTORY — DX: Alcohol abuse, uncomplicated: F10.10

## 2011-12-28 HISTORY — DX: Other chest pain: R07.89

## 2011-12-28 HISTORY — DX: Gastro-esophageal reflux disease without esophagitis: K21.9

## 2011-12-28 HISTORY — DX: Alcoholic liver disease, unspecified: K70.9

## 2011-12-28 HISTORY — DX: Gastritis, unspecified, without bleeding: K29.70

## 2011-12-28 LAB — ETHANOL: Alcohol, Ethyl (B): 321 mg/dL — ABNORMAL HIGH (ref 0–11)

## 2011-12-28 LAB — CBC
HCT: 42.5 % (ref 39.0–52.0)
Hemoglobin: 15.4 g/dL (ref 13.0–17.0)
RBC: 4.92 MIL/uL (ref 4.22–5.81)
RDW: 12.8 % (ref 11.5–15.5)
WBC: 10.3 10*3/uL (ref 4.0–10.5)

## 2011-12-28 LAB — BASIC METABOLIC PANEL
Chloride: 103 mEq/L (ref 96–112)
GFR calc Af Amer: >90 mL/min (ref >90)
Potassium: 4.2 mEq/L (ref 3.5–5.1)
Sodium: 143 mEq/L (ref 135–145)

## 2011-12-28 LAB — RAPID URINE DRUG SCREEN, HOSP PERFORMED: Barbiturates: NOT DETECTED

## 2011-12-28 LAB — TROPONIN I: Troponin I: <0.3 ng/mL (ref <0.30)

## 2011-12-28 MED ORDER — GI COCKTAIL ~~LOC~~
30.0000 mL | Freq: Once | ORAL | Status: AC
  Administered 2011-12-28: 30 mL via ORAL
  Filled 2011-12-28: qty 30

## 2011-12-28 MED ORDER — LORAZEPAM 1 MG PO TABS
1.0000 mg | ORAL_TABLET | Freq: Once | ORAL | Status: AC
  Administered 2011-12-28: 1 mg via ORAL
  Filled 2011-12-28: qty 1

## 2011-12-28 NOTE — ED Notes (Signed)
Report given to [facility]. No cardiac or respiratory distress at this time. Pt transported by RN on monitor. CDU4. Barbara Cower RN and EDP okayed pt to go to CDU.

## 2011-12-28 NOTE — ED Notes (Signed)
Pt had 324mg  ASA and nitro SL x2.  Pt currently 9/10 CP.  EKG ST with EMS.

## 2011-12-28 NOTE — ED Notes (Signed)
States he noticed chest pain this morning. States pain did not alleviate with stretching. Intermittent stabbing with radiation down back. States cramping pain when lying on side. Similar pain in past.

## 2011-12-28 NOTE — ED Notes (Signed)
Pt stated that he has been having intermittent left sided CP for a long time. Pain is sharp and does not radiate. Pt stated that pain is worst when he lays on his left side but it better when he is on his back. No SOB or diaphoresis. Pt has nausea but no vomiting. Pt stated that pain is worse today starting at 6AM. Will continue to monitor.

## 2011-12-28 NOTE — ED Notes (Signed)
CP, SOB, nausea beginning at 0530.  Thought to be indigestion.  +ETOH 1/5th of rum.
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
## 2011-12-29 ENCOUNTER — Encounter (HOSPITAL_COMMUNITY): Payer: Self-pay | Admitting: *Deleted

## 2011-12-29 ENCOUNTER — Emergency Department (HOSPITAL_COMMUNITY)
Admission: EM | Admit: 2011-12-29 | Discharge: 2012-01-01 | Disposition: A | Discharge status: Home / Self Care | Payer: Self-pay | Attending: Emergency Medicine | Admitting: Emergency Medicine

## 2011-12-29 DIAGNOSIS — I10 Essential (primary) hypertension: Secondary | ICD-10-CM | POA: Insufficient documentation

## 2011-12-29 DIAGNOSIS — K219 Gastro-esophageal reflux disease without esophagitis: Secondary | ICD-10-CM | POA: Insufficient documentation

## 2011-12-29 DIAGNOSIS — F1010 Alcohol abuse, uncomplicated: Secondary | ICD-10-CM | POA: Insufficient documentation

## 2011-12-29 DIAGNOSIS — F10929 Alcohol use, unspecified with intoxication, unspecified: Secondary | ICD-10-CM

## 2011-12-29 DIAGNOSIS — G8929 Other chronic pain: Secondary | ICD-10-CM | POA: Insufficient documentation

## 2011-12-29 LAB — ETHANOL
Alcohol, Ethyl (B): 206 mg/dL — ABNORMAL HIGH (ref 0–11)
Alcohol, Ethyl (B): 156 mg/dL — ABNORMAL HIGH (ref 0–11)

## 2011-12-29 LAB — CBC
HCT: 41.9 % (ref 39.0–52.0)
Platelets: 200 10*3/uL (ref 150–400)
RDW: 12.4 % (ref 11.5–15.5)
WBC: 11.5 10*3/uL — ABNORMAL HIGH (ref 4.0–10.5)

## 2011-12-29 LAB — COMPREHENSIVE METABOLIC PANEL
ALT: 35 U/L (ref 0–53)
Alkaline Phosphatase: 72 U/L (ref 39–117)
Chloride: 102 mEq/L (ref 96–112)
GFR calc Af Amer: >90 mL/min (ref >90)
Glucose, Bld: 100 mg/dL — ABNORMAL HIGH (ref 70–99)
Potassium: 3.8 mEq/L (ref 3.5–5.1)
Sodium: 141 mEq/L (ref 135–145)
Total Protein: 7.5 g/dL (ref 6.0–8.3)

## 2011-12-29 LAB — RAPID URINE DRUG SCREEN, HOSP PERFORMED
Amphetamines: NOT DETECTED
Barbiturates: NOT DETECTED
Benzodiazepines: NOT DETECTED

## 2011-12-29 MED ORDER — FOLIC ACID 1 MG PO TABS
1.0000 mg | ORAL_TABLET | Freq: Every day | ORAL | Status: DC
  Administered 2011-12-30 – 2012-01-01 (×3): 1 mg via ORAL
  Filled 2011-12-29 (×3): qty 1

## 2011-12-29 MED ORDER — LORAZEPAM 1 MG PO TABS: 1.0000 mg | ORAL_TABLET | Freq: Three times a day (TID) | ORAL | Status: DC | PRN

## 2011-12-29 MED ORDER — LORAZEPAM 1 MG PO TABS
2.0000 mg | ORAL_TABLET | Freq: Once | ORAL | Status: AC
  Administered 2011-12-29: 2 mg via ORAL
  Filled 2011-12-29: qty 2

## 2011-12-29 MED ORDER — LORAZEPAM 1 MG PO TABS
1.0000 mg | ORAL_TABLET | Freq: Once | ORAL | Status: AC
  Administered 2011-12-29: 1 mg via ORAL
  Filled 2011-12-29: qty 1

## 2011-12-29 MED ORDER — ONDANSETRON 4 MG PO TBDP
8.0000 mg | ORAL_TABLET | Freq: Once | ORAL | Status: AC
  Administered 2011-12-29: 8 mg via ORAL
  Filled 2011-12-29: qty 2

## 2011-12-29 MED ORDER — LORAZEPAM 2 MG/ML IJ SOLN: 1.0000 mg | Freq: Four times a day (QID) | INTRAMUSCULAR | Status: DC | PRN

## 2011-12-29 MED ORDER — PROMETHAZINE HCL 25 MG PO TABS: 25.0000 mg | ORAL_TABLET | Freq: Four times a day (QID) | ORAL | Status: DC | PRN

## 2011-12-29 MED ORDER — THIAMINE HCL 100 MG/ML IJ SOLN: 100.0000 mg | Freq: Every day | INTRAMUSCULAR | Status: DC

## 2011-12-29 MED ORDER — VITAMIN B-1 100 MG PO TABS: 100.0000 mg | ORAL_TABLET | Freq: Every day | ORAL | Status: DC

## 2011-12-29 MED ORDER — ZOLPIDEM TARTRATE 5 MG PO TABS
5.0000 mg | ORAL_TABLET | Freq: Every evening | ORAL | Status: DC | PRN
  Administered 2011-12-29 – 2012-01-01 (×2): 5 mg via ORAL
  Filled 2011-12-29 (×2): qty 1

## 2011-12-29 MED ORDER — IBUPROFEN 200 MG PO TABS
600.0000 mg | ORAL_TABLET | Freq: Three times a day (TID) | ORAL | Status: DC | PRN
  Administered 2011-12-30 – 2012-01-01 (×6): 600 mg via ORAL
  Filled 2011-12-29: qty 3
  Filled 2011-12-29: qty 1
  Filled 2011-12-29 (×5): qty 3

## 2011-12-29 MED ORDER — ALUM & MAG HYDROXIDE-SIMETH 200-200-20 MG/5ML PO SUSP
30.0000 mL | ORAL | Status: DC | PRN
  Administered 2011-12-30 (×2): 30 mL via ORAL
  Filled 2011-12-29 (×2): qty 30

## 2011-12-29 MED ORDER — NICOTINE 21 MG/24HR TD PT24: 21.0000 mg | MEDICATED_PATCH | Freq: Every day | TRANSDERMAL | Status: DC | PRN

## 2011-12-29 MED ORDER — LORAZEPAM 1 MG PO TABS
1.0000 mg | ORAL_TABLET | Freq: Four times a day (QID) | ORAL | Status: DC | PRN
  Administered 2011-12-29 – 2012-01-01 (×10): 1 mg via ORAL
  Filled 2011-12-29 (×10): qty 1

## 2011-12-29 MED ORDER — ACETAMINOPHEN 325 MG PO TABS: 650.0000 mg | ORAL_TABLET | ORAL | Status: DC | PRN

## 2011-12-29 MED ORDER — ONDANSETRON HCL 8 MG PO TABS
4.0000 mg | ORAL_TABLET | Freq: Three times a day (TID) | ORAL | Status: DC | PRN
  Administered 2011-12-30: 4 mg via ORAL
  Filled 2011-12-29: qty 1

## 2011-12-29 MED ORDER — ADULT MULTIVITAMIN W/MINERALS CH
1.0000 | ORAL_TABLET | Freq: Every day | ORAL | Status: DC
  Administered 2011-12-30 – 2012-01-01 (×3): 1 via ORAL
  Filled 2011-12-29 (×3): qty 1

## 2011-12-29 NOTE — ED Notes (Signed)
Patient is here for detox.  He went to [facility] and sent here for clearance.  He states he drinks a 5th each day.  He reports he has been binging since Friday.   Patient last etoh was today

## 2011-12-29 NOTE — ED Notes (Signed)
Attempt to call report to [facility], Wynona Canes states "His return will need to be cleared by administration tomorrow." Act team informed. Dr Radford Pax informed.
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
## 2011-12-29 NOTE — ED Notes (Signed)
Brain from [facility] informed of ETOH. States must be under 200. Pt informed.

## 2011-12-29 NOTE — ED Notes (Signed)
Pt belongings in locker #9. Inventory sheet in chart, verified with Dennie Bible, RN

## 2011-12-29 NOTE — ED Provider Notes (Signed)
Patient is now all of functional and ready for discharge chest pain has been evaluated with negative troponins no acute changes on EKG patient has a history of chronic alcohol abuse resources guide provided to help him find resources to help with that of the discharge home with Ativan and Phenergan.  Shelda Jakes, MD 12/29/11 [phone]

## 2011-12-29 NOTE — ED Notes (Signed)
Discharged home with written and verbal instructions.  Understands instructions.  Prescriptions given to patient.  Bus pass provided

## 2011-12-29 NOTE — ED Notes (Signed)
Pt resting with eyes closed. Cardiac monitoring intact.

## 2011-12-29 NOTE — ED Notes (Signed)
[facility] states will take pt when etoh is below 200. Repeat etoh drawn. Pt aware of plan. Ativan given per pt request. States "my nerves are bothering me."

## 2011-12-29 NOTE — ED Notes (Addendum)
Per Arlys Najeeb, RN at [facility] patient arrived at their facility at 1245 and appeared to be drunk, sweating and hallucinating that bugs were crawling on the walls and patient did not know the day of the week or the month. Brain stated they would take the patient back at [facility] one medically cleared and sober. ACT advised.

## 2011-12-29 NOTE — ED Notes (Signed)
Patient requesting to speak with EDP about nausea and withdrawal symptoms. Patient eating and drinking with no difficulty. No signs or symptoms on nausea. EDP notified. Will continue to monitor patient

## 2011-12-30 MED ORDER — VITAMIN B-1 100 MG PO TABS
100.0000 mg | ORAL_TABLET | Freq: Every day | ORAL | Status: DC
  Administered 2011-12-30 – 2012-01-01 (×3): 100 mg via ORAL
  Filled 2011-12-30 (×3): qty 1

## 2011-12-30 NOTE — ED Notes (Signed)
Patient reports no nausea at this time.  States he has had a few loose stools.  Patient giver orange juice by request.  No other complaints.

## 2011-12-30 NOTE — ED Notes (Signed)
Patient states he has a headache and body aches.  Patient asked for a shot of morphine.  Advised patient that he only had orders for tylenol or motrin.  Patient then asked for a percocet.  Again, patient advised that he only has orders for tylenol or motrin.  Patient requests motrin for pain at this time.
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
## 2011-12-30 NOTE — ED Notes (Signed)
Patient reporting some nausea and diarrhea.  Patient has ambulated to the bathroom 3-4 times in the last hour.  Patient requesting something for nausea.

## 2011-12-30 NOTE — BH Assessment (Signed)
BHH Assessment Progress Note      Spoke with Kristen at [facility] who reports the patient will have to be reviewed by administration.  He was sent there yesterday and stopped to drink on the way.  When he showed up inebriated they sent him back to the emergency room.  Baxter Hire reports that the reason they may not reconsider him for admission is that he was a behavior problem during his last stay, but she was hopeful that they would offer him a behavior contract and allow him to return.  She will follow up later this morning.  New information and assessment faxed to [facility].

## 2011-12-31 NOTE — ED Notes (Signed)
Pt sitting eating lunch tray and watching tv; offered shower and wants to later; will supply items for that; has no other needs at this time

## 2011-12-31 NOTE — ED Notes (Signed)
Pt given a basin with toothpaste, toothbrush, shampoo, 2 bars of soap,lotion and deoderant with a change of scrubs, 2 towels and 2 washcloths

## 2011-12-31 NOTE — ED Notes (Signed)
Has finished eating lunch tray and wants a shower; showed pt to shower room; will change pt's bed linens at this time

## 2011-12-31 NOTE — ED Notes (Signed)
Pt watching tv, asking for a motrin and ativan; will inform RN

## 2011-12-31 NOTE — ED Notes (Signed)
Introduced self to pt. Discussed night time medications. Pt denies further needs at this time
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
## 2011-12-31 NOTE — ED Notes (Signed)
Pt eating dinner tray with a cup of coke with ice to drink

## 2012-01-01 NOTE — ED Notes (Signed)
Patient taking a bath and getting ready for discharge. Social worker working on transportation.
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
## 2012-01-01 NOTE — ED Provider Notes (Signed)
Pt in bed, resting quietly. Nad. No tremor or shakes. Vital signs normal. No sign of etoh withdrawal. Act team indicates pt declined at [facility], pt was not interested in any other facility. Act recommends d/c w resource guide.   Suzi Roots, MD 01/01/12 [phone]

## 2012-01-01 NOTE — ED Notes (Signed)
ED MSW NOTE:  MSW received call from RN requesting to provide pt with transportation to [facility] for [place]. MSW met with pt and informed him the [facility] bus does not operate on the weekends. MSW further informed pt, [facility] does not provide transportation outside of [place]. Pt reports his cell phone is dead and is without a Consulting civil engineer. This Clinical research associate was able to obtain a Consulting civil engineer for pt and encouraged pt to call his family/friends and inquire if they are able to provide transportation for disposition today. Pt was cleared via MD for discharge. ACT team provided pt community resources to address his ETOh needs.  Pt informed this Clinical research associate he was able to locate a friend who will pick him up and let him stay with him until Monday 01/03/12 where pt will then utilize the [facility] for transportation to [facility].  Pt voiced this to be the best and safest discharge plan and requested to have a bus pass for Monday 01/03/12 to [place]. MSW provided x1 bus pass and confirmed pt is with community based resources.  Pt's bedside RN Asher Muir updated.  No further MSW interventions identified.   Dionne Milo MSW [facility] Emergency Dept. Weekend/Social Worker [phone]

## 2012-01-01 NOTE — ED Notes (Signed)
Patient alert and oriented times 4, denies pain pain or discomfort. Patient will be discharged with a friend.  Teach back instructions given, and patient verbalizes an understanding.

## 2012-01-06 ENCOUNTER — Encounter (HOSPITAL_COMMUNITY): Payer: Self-pay | Admitting: *Deleted

## 2012-01-06 ENCOUNTER — Emergency Department (HOSPITAL_COMMUNITY)
Admission: EM | Admit: 2012-01-06 | Discharge: 2012-01-07 | Disposition: A | Discharge status: Rehabilitation Facility | Payer: Self-pay | Attending: Emergency Medicine | Admitting: Emergency Medicine

## 2012-01-06 DIAGNOSIS — F1020 Alcohol dependence, uncomplicated: Secondary | ICD-10-CM

## 2012-01-06 DIAGNOSIS — Z008 Encounter for other general examination: Secondary | ICD-10-CM | POA: Insufficient documentation

## 2012-01-06 DIAGNOSIS — M25569 Pain in unspecified knee: Secondary | ICD-10-CM | POA: Insufficient documentation

## 2012-01-06 LAB — CBC WITH DIFFERENTIAL/PLATELET
Basophils Absolute: 0.1 K/uL (ref 0.0–0.1)
Basophils Relative: 1 % (ref 0–1)
Eosinophils Absolute: 0.4 10*3/uL (ref 0.0–0.7)
Eosinophils Relative: 6 % — ABNORMAL HIGH (ref 0–5)
HCT: 41.2 % (ref 39.0–52.0)
Hemoglobin: 15 g/dL (ref 13.0–17.0)
Lymphocytes Relative: 42 % (ref 12–46)
Lymphs Abs: 3.2 10*3/uL (ref 0.7–4.0)
MCH: 31.2 pg (ref 26.0–34.0)
MCHC: 36.4 g/dL — ABNORMAL HIGH (ref 30.0–36.0)
MCV: 85.7 fL (ref 78.0–100.0)
Monocytes Absolute: 0.5 K/uL (ref 0.1–1.0)
Monocytes Relative: 6 % (ref 3–12)
Neutro Abs: 3.4 K/uL (ref 1.7–7.7)
Neutrophils Relative %: 46 % (ref 43–77)
Platelets: 157 10*3/uL (ref 150–400)
RBC: 4.81 MIL/uL (ref 4.22–5.81)
RDW: 12.9 % (ref 11.5–15.5)
WBC: 7.6 K/uL (ref 4.0–10.5)

## 2012-01-06 LAB — COMPREHENSIVE METABOLIC PANEL WITH GFR
ALT: 24 U/L (ref 0–53)
AST: 30 U/L (ref 0–37)
Albumin: 4.3 g/dL (ref 3.5–5.2)
Alkaline Phosphatase: 72 U/L (ref 39–117)
CO2: 23 meq/L (ref 19–32)
Chloride: 100 meq/L (ref 96–112)
Creatinine, Ser: 0.71 mg/dL (ref 0.50–1.35)
GFR calc non Af Amer: >90 mL/min (ref >90)
Potassium: 3.7 meq/L (ref 3.5–5.1)
Sodium: 142 meq/L (ref 135–145)
Total Bilirubin: 0.7 mg/dL (ref 0.3–1.2)

## 2012-01-06 LAB — COMPREHENSIVE METABOLIC PANEL
BUN: 5 mg/dL — ABNORMAL LOW (ref 6–23)
Calcium: 8.8 mg/dL (ref 8.4–10.5)
GFR calc Af Amer: >90 mL/min (ref >90)
Glucose, Bld: 89 mg/dL (ref 70–99)
Total Protein: 7.6 g/dL (ref 6.0–8.3)

## 2012-01-06 LAB — LIPASE, BLOOD: Lipase: 25 U/L (ref 11–59)

## 2012-01-06 LAB — TROPONIN I: Troponin I: <0.3 ng/mL (ref <0.30)

## 2012-01-06 LAB — ETHANOL: Alcohol, Ethyl (B): 370 mg/dL — ABNORMAL HIGH (ref 0–11)

## 2012-01-06 MED ORDER — SODIUM CHLORIDE 0.9 % IV BOLUS (SEPSIS)
1000.0000 mL | Freq: Once | INTRAVENOUS | Status: AC
  Administered 2012-01-06: 1000 mL via INTRAVENOUS
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
## 2012-01-06 NOTE — ED Notes (Signed)
The pt is here for detoxed from alcohol.  His last drink was 30 minutes ago.  He is also c/o chest pain for 3-4 days associayed with the alcohol

## 2012-01-06 NOTE — ED Notes (Signed)
Pt reports wants detox from alcohol. Last drink 30 min pta. Reports nausea, headache, anxiety. Reports chest pain, began 2 years ago. Pt reports chest pain has been dx as angina, but does not believe it is an accurate dx. Pt believes his chest pain is coming from "multiple myeloma".

## 2012-01-07 LAB — URINALYSIS, ROUTINE W REFLEX MICROSCOPIC
Bilirubin Urine: NEGATIVE
Glucose, UA: NEGATIVE mg/dL
Hgb urine dipstick: NEGATIVE
Ketones, ur: 15 mg/dL — AB
Leukocytes, UA: NEGATIVE
Nitrite: NEGATIVE
Protein, ur: NEGATIVE mg/dL
Specific Gravity, Urine: 1.007 (ref 1.005–1.030)
Urobilinogen, UA: 0.2 mg/dL (ref 0.0–1.0)
pH: 6 (ref 5.0–8.0)

## 2012-01-07 LAB — RAPID URINE DRUG SCREEN, HOSP PERFORMED
Amphetamines: NOT DETECTED
Barbiturates: NOT DETECTED
Benzodiazepines: NOT DETECTED
Cocaine: NOT DETECTED
Opiates: NOT DETECTED
Tetrahydrocannabinol: NOT DETECTED

## 2012-01-07 LAB — POCT I-STAT TROPONIN I: Troponin i, poc: 0 ng/mL (ref 0.00–0.08)

## 2012-01-07 MED ORDER — ONDANSETRON HCL 8 MG PO TABS
4.0000 mg | ORAL_TABLET | Freq: Three times a day (TID) | ORAL | Status: DC | PRN
  Administered 2012-01-07: 4 mg via ORAL
  Filled 2012-01-07: qty 2

## 2012-01-07 MED ORDER — ONDANSETRON 4 MG PO TBDP
4.0000 mg | ORAL_TABLET | Freq: Once | ORAL | Status: AC
  Administered 2012-01-07: 4 mg via ORAL

## 2012-01-07 MED ORDER — ADULT MULTIVITAMIN W/MINERALS CH: 1.0000 | ORAL_TABLET | Freq: Every day | ORAL | Status: DC

## 2012-01-07 MED ORDER — VITAMIN B-1 100 MG PO TABS
100.0000 mg | ORAL_TABLET | Freq: Every day | ORAL | Status: DC
  Filled 2012-01-07: qty 1

## 2012-01-07 MED ORDER — ACETAMINOPHEN 325 MG PO TABS: 650.0000 mg | ORAL_TABLET | ORAL | Status: DC | PRN

## 2012-01-07 MED ORDER — IBUPROFEN 400 MG PO TABS
600.0000 mg | ORAL_TABLET | Freq: Three times a day (TID) | ORAL | Status: DC | PRN
  Administered 2012-01-07: 600 mg via ORAL
  Filled 2012-01-07: qty 1

## 2012-01-07 MED ORDER — ONDANSETRON 4 MG PO TBDP
ORAL_TABLET | ORAL | Status: AC
  Filled 2012-01-07: qty 1

## 2012-01-07 MED ORDER — ADULT MULTIVITAMIN W/MINERALS CH
1.0000 | ORAL_TABLET | Freq: Every day | ORAL | Status: DC
  Filled 2012-01-07: qty 1

## 2012-01-07 MED ORDER — THIAMINE HCL 100 MG/ML IJ SOLN: 100.0000 mg | Freq: Every day | INTRAMUSCULAR | Status: DC

## 2012-01-07 MED ORDER — LORAZEPAM 1 MG PO TABS
0.0000 mg | ORAL_TABLET | Freq: Four times a day (QID) | ORAL | Status: DC
  Administered 2012-01-07 (×3): 1 mg via ORAL
  Filled 2012-01-07 (×3): qty 1

## 2012-01-07 MED ORDER — LORAZEPAM 1 MG PO TABS: 0.0000 mg | ORAL_TABLET | Freq: Two times a day (BID) | ORAL | Status: DC

## 2012-01-07 MED ORDER — FOLIC ACID 1 MG PO TABS
1.0000 mg | ORAL_TABLET | Freq: Every day | ORAL | Status: DC
  Filled 2012-01-07: qty 1
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
## 2012-01-07 NOTE — ED Notes (Signed)
Pt resting on stretcher, no needs at this time.

## 2012-01-07 NOTE — ED Notes (Signed)
Pt declines vitamins due to nausea

## 2012-01-07 NOTE — ED Notes (Signed)
Pt stated he will eat his lunch tray later, he's tired and just wants to sleep right now

## 2012-01-07 NOTE — Progress Notes (Signed)
Orthopedic Tech Progress Note Patient Details:  Brent Gay [birth year] [id number]  Ortho Devices Type of Ortho Device: Knee Sleeve Ortho Device/Splint Location: right knee Ortho Device/Splint Interventions: Application   Devoiry Corriher T 01/07/2012, 9:48 AM

## 2012-01-07 NOTE — ED Notes (Signed)
Pt reports feeling nauseated, ODT zofran ordered per Dr Effie Shy

## 2012-01-07 NOTE — ED Notes (Signed)
Pt asleep on the stretcher

## 2012-01-07 NOTE — ED Notes (Signed)
Lunch tray being delivered 

## 2012-01-07 NOTE — ED Notes (Signed)
ACT team member, Berna Spare, in to talk with patient.  [facility] willing to accept patient.

## 2012-01-07 NOTE — ED Notes (Signed)
Pt sleeping. 

## 2012-01-07 NOTE — ED Notes (Signed)
Security called to wand patient 

## 2012-01-07 NOTE — ED Notes (Signed)
Pt accepted to [facility]. Sister coming to pick pt up.

## 2012-01-07 NOTE — ED Notes (Signed)
Pt sister at the bedside visiting

## 2012-01-07 NOTE — ED Notes (Signed)
Patient arrived from [facility] after being cleared from his "chest pain" which he told this nurse it was from his drinking.  IVF infusing without difficulty.  States his family really wants him to get detox from the alcohol.  He is unsure if he wants to get clean.  States "can I get some Morphine for my back".  MD aware.

## 2012-01-07 NOTE — ED Notes (Signed)
Dr Effie Shy into see patient, reports right knee pain, did not report this to RN, knee sleeve ordered, ortho tech paged

## 2012-01-07 NOTE — ED Notes (Signed)
Patient requesting something for pain and nausea.  Orders recd.  Motrin 600mg  and Zofran 4mg  given PO.  Ativan 1mg  given for anxiety and upon request.  Requesting Morphine for his back pain.  Instructed patient the MD will not order Morphine for someone trying to detox.

## 2012-01-11 NOTE — ED Provider Notes (Signed)
Medical screening examination/treatment/procedure(s) were performed by non-physician practitioner and as supervising physician I was immediately available for consultation/collaboration.   Gavin Pound. Oletta Lamas, MD 01/11/12 [phone]

## 2012-03-15 ENCOUNTER — Emergency Department (HOSPITAL_COMMUNITY)
Admission: EM | Admit: 2012-03-15 | Discharge: 2012-03-15 | Disposition: A | Discharge status: Home / Self Care | Payer: Self-pay | Attending: Emergency Medicine | Admitting: Emergency Medicine

## 2012-03-15 ENCOUNTER — Encounter (HOSPITAL_COMMUNITY): Payer: Self-pay | Admitting: *Deleted

## 2012-03-15 DIAGNOSIS — I10 Essential (primary) hypertension: Secondary | ICD-10-CM | POA: Insufficient documentation

## 2012-03-15 DIAGNOSIS — Z7982 Long term (current) use of aspirin: Secondary | ICD-10-CM | POA: Insufficient documentation

## 2012-03-15 DIAGNOSIS — K219 Gastro-esophageal reflux disease without esophagitis: Secondary | ICD-10-CM | POA: Insufficient documentation

## 2012-03-15 DIAGNOSIS — K920 Hematemesis: Secondary | ICD-10-CM | POA: Insufficient documentation

## 2012-03-15 DIAGNOSIS — F1010 Alcohol abuse, uncomplicated: Secondary | ICD-10-CM | POA: Insufficient documentation

## 2012-03-15 DIAGNOSIS — R002 Palpitations: Secondary | ICD-10-CM | POA: Insufficient documentation

## 2012-03-15 DIAGNOSIS — R109 Unspecified abdominal pain: Secondary | ICD-10-CM | POA: Insufficient documentation

## 2012-03-15 DIAGNOSIS — K709 Alcoholic liver disease, unspecified: Secondary | ICD-10-CM | POA: Insufficient documentation

## 2012-03-15 DIAGNOSIS — Z79899 Other long term (current) drug therapy: Secondary | ICD-10-CM | POA: Insufficient documentation

## 2012-03-15 LAB — COMPREHENSIVE METABOLIC PANEL
Albumin: 3.7 g/dL (ref 3.5–5.2)
Alkaline Phosphatase: 69 U/L (ref 39–117)
BUN: 7 mg/dL (ref 6–23)
Potassium: 3.8 mEq/L (ref 3.5–5.1)
Total Protein: 7 g/dL (ref 6.0–8.3)

## 2012-03-15 LAB — CBC WITH DIFFERENTIAL/PLATELET
Basophils Relative: 1 % (ref 0–1)
Eosinophils Absolute: 0.1 10*3/uL (ref 0.0–0.7)
Hemoglobin: 15.9 g/dL (ref 13.0–17.0)
MCH: 31.4 pg (ref 26.0–34.0)
MCHC: 36.6 g/dL — ABNORMAL HIGH (ref 30.0–36.0)
Monocytes Relative: 9 % (ref 3–12)
Neutrophils Relative %: 41 % — ABNORMAL LOW (ref 43–77)
Platelets: 217 10*3/uL (ref 150–400)
RDW: 13.7 % (ref 11.5–15.5)

## 2012-03-15 LAB — PROTIME-INR
INR: 1.07 (ref 0.00–1.49)
Prothrombin Time: 13.8 seconds (ref 11.6–15.2)

## 2012-03-15 MED ORDER — PROMETHAZINE HCL 25 MG PO TABS: 25.0000 mg | ORAL_TABLET | Freq: Four times a day (QID) | ORAL | Status: DC | PRN

## 2012-03-15 MED ORDER — PANTOPRAZOLE SODIUM 40 MG IV SOLR
40.0000 mg | Freq: Once | INTRAVENOUS | Status: AC
  Administered 2012-03-15: 40 mg via INTRAVENOUS
  Filled 2012-03-15: qty 40

## 2012-03-15 MED ORDER — SODIUM CHLORIDE 0.9 % IV BOLUS (SEPSIS)
1000.0000 mL | Freq: Once | INTRAVENOUS | Status: AC
  Administered 2012-03-15: 1000 mL via INTRAVENOUS

## 2012-03-15 MED ORDER — LORAZEPAM 1 MG PO TABS
1.0000 mg | ORAL_TABLET | Freq: Once | ORAL | Status: AC
  Administered 2012-03-15: 1 mg via ORAL
  Filled 2012-03-15: qty 1

## 2012-03-15 MED ORDER — ONDANSETRON HCL 4 MG/2ML IJ SOLN
4.0000 mg | Freq: Once | INTRAMUSCULAR | Status: AC
  Administered 2012-03-15: 4 mg via INTRAVENOUS
  Filled 2012-03-15: qty 2

## 2012-03-15 MED ORDER — RANITIDINE HCL 150 MG PO CAPS: 150.0000 mg | ORAL_CAPSULE | Freq: Every day | ORAL | Status: DC

## 2012-03-15 NOTE — ED Notes (Signed)
ZOX:[id number]<AV> Expected date:03/15/12<BR> Expected time: 6:47 AM<BR> Means of arrival:Ambulance<BR> Comments:<BR> abd pain, vomiting blood, ETOH

## 2012-03-15 NOTE — ED Notes (Signed)
Pt reports last drink around 10pm last night

## 2012-03-15 NOTE — ED Provider Notes (Signed)
Medical screening examination/treatment/procedure(s) were performed by non-physician practitioner and as supervising physician I was immediately available for consultation/collaboration.   Boots Mcglown L Dedee Liss, MD 03/15/12 1346 

## 2012-03-15 NOTE — ED Notes (Signed)
Brought in by EMS from home with c/o abdominal pain with nausea and vomiting and "bright red" blood in emesis 6 hours ago. Per EMS, pt reported that he has been drinking "vodka and wine everyday for 2 weeks now".
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
## 2012-04-25 ENCOUNTER — Encounter (HOSPITAL_COMMUNITY): Payer: Self-pay | Admitting: Internal Medicine

## 2012-04-25 ENCOUNTER — Inpatient Hospital Stay (HOSPITAL_COMMUNITY)
Admission: EM | Admit: 2012-04-25 | Discharge: 2012-04-28 | DRG: 313 | Disposition: A | Discharge status: Home / Self Care | Payer: 59 | Attending: Family Medicine | Admitting: Family Medicine

## 2012-04-25 ENCOUNTER — Emergency Department (HOSPITAL_COMMUNITY): Payer: Self-pay

## 2012-04-25 ENCOUNTER — Observation Stay (HOSPITAL_COMMUNITY): Payer: Self-pay

## 2012-04-25 DIAGNOSIS — R0789 Other chest pain: Secondary | ICD-10-CM

## 2012-04-25 DIAGNOSIS — E785 Hyperlipidemia, unspecified: Secondary | ICD-10-CM | POA: Diagnosis present

## 2012-04-25 DIAGNOSIS — I10 Essential (primary) hypertension: Secondary | ICD-10-CM | POA: Diagnosis present

## 2012-04-25 DIAGNOSIS — F10931 Alcohol use, unspecified with withdrawal delirium: Secondary | ICD-10-CM | POA: Diagnosis present

## 2012-04-25 DIAGNOSIS — R000 Tachycardia, unspecified: Secondary | ICD-10-CM | POA: Diagnosis present

## 2012-04-25 DIAGNOSIS — Z87891 Personal history of nicotine dependence: Secondary | ICD-10-CM

## 2012-04-25 DIAGNOSIS — K219 Gastro-esophageal reflux disease without esophagitis: Secondary | ICD-10-CM | POA: Diagnosis present

## 2012-04-25 DIAGNOSIS — F10231 Alcohol dependence with withdrawal delirium: Secondary | ICD-10-CM | POA: Diagnosis present

## 2012-04-25 DIAGNOSIS — F1010 Alcohol abuse, uncomplicated: Secondary | ICD-10-CM

## 2012-04-25 DIAGNOSIS — F1020 Alcohol dependence, uncomplicated: Secondary | ICD-10-CM | POA: Diagnosis present

## 2012-04-25 DIAGNOSIS — K709 Alcoholic liver disease, unspecified: Secondary | ICD-10-CM | POA: Diagnosis present

## 2012-04-25 DIAGNOSIS — Z23 Encounter for immunization: Secondary | ICD-10-CM

## 2012-04-25 DIAGNOSIS — R079 Chest pain, unspecified: Principal | ICD-10-CM | POA: Diagnosis present

## 2012-04-25 LAB — CBC WITH DIFFERENTIAL/PLATELET
Basophils Absolute: 0.1 10*3/uL (ref 0.0–0.1)
Basophils Relative: 1 % (ref 0–1)
Eosinophils Relative: 2 % (ref 0–5)
HCT: 46.2 % (ref 39.0–52.0)
MCHC: 35.7 g/dL (ref 30.0–36.0)
MCV: 88.8 fL (ref 78.0–100.0)
Monocytes Absolute: 0.3 10*3/uL (ref 0.1–1.0)
RDW: 15.1 % (ref 11.5–15.5)

## 2012-04-25 LAB — COMPREHENSIVE METABOLIC PANEL
ALT: 19 U/L (ref 0–53)
AST: 31 U/L (ref 0–37)
Albumin: 4.3 g/dL (ref 3.5–5.2)
Alkaline Phosphatase: 92 U/L (ref 39–117)
Glucose, Bld: 101 mg/dL — ABNORMAL HIGH (ref 70–99)
Potassium: 4.2 mEq/L (ref 3.5–5.1)
Sodium: 140 mEq/L (ref 135–145)
Total Protein: 8.2 g/dL (ref 6.0–8.3)

## 2012-04-25 LAB — TROPONIN I
Troponin I: <0.3 ng/mL (ref <0.30)
Troponin I: <0.3 ng/mL (ref <0.30)

## 2012-04-25 MED ORDER — ONDANSETRON HCL 4 MG PO TABS: 4.0000 mg | ORAL_TABLET | Freq: Four times a day (QID) | ORAL | Status: DC | PRN

## 2012-04-25 MED ORDER — ONDANSETRON HCL 4 MG/2ML IJ SOLN
4.0000 mg | Freq: Once | INTRAMUSCULAR | Status: AC
  Administered 2012-04-25: 4 mg via INTRAVENOUS
  Filled 2012-04-25: qty 2

## 2012-04-25 MED ORDER — MORPHINE SULFATE 4 MG/ML IJ SOLN
4.0000 mg | Freq: Once | INTRAMUSCULAR | Status: AC
  Administered 2012-04-25: 4 mg via INTRAVENOUS
  Filled 2012-04-25: qty 1

## 2012-04-25 MED ORDER — THIAMINE HCL 100 MG/ML IJ SOLN
100.0000 mg | Freq: Every day | INTRAMUSCULAR | Status: DC
  Filled 2012-04-25 (×2): qty 1

## 2012-04-25 MED ORDER — LORAZEPAM 2 MG/ML IJ SOLN
1.0000 mg | Freq: Once | INTRAMUSCULAR | Status: AC
  Administered 2012-04-25: 1 mg via INTRAVENOUS

## 2012-04-25 MED ORDER — LORAZEPAM 2 MG/ML IJ SOLN
1.0000 mg | Freq: Once | INTRAMUSCULAR | Status: AC
  Administered 2012-04-25: 1 mg via INTRAVENOUS
  Filled 2012-04-25: qty 1

## 2012-04-25 MED ORDER — VITAMIN B-1 100 MG PO TABS
100.0000 mg | ORAL_TABLET | Freq: Every day | ORAL | Status: DC
  Administered 2012-04-26 – 2012-04-28 (×3): 100 mg via ORAL
  Filled 2012-04-25 (×3): qty 1

## 2012-04-25 MED ORDER — ONDANSETRON HCL 4 MG/2ML IJ SOLN
4.0000 mg | Freq: Four times a day (QID) | INTRAMUSCULAR | Status: DC | PRN
  Administered 2012-04-25 – 2012-04-26 (×2): 4 mg via INTRAVENOUS
  Filled 2012-04-25 (×2): qty 2

## 2012-04-25 MED ORDER — LORAZEPAM 1 MG PO TABS
1.0000 mg | ORAL_TABLET | Freq: Four times a day (QID) | ORAL | Status: DC | PRN
  Administered 2012-04-26: 1 mg via ORAL
  Filled 2012-04-25 (×2): qty 1

## 2012-04-25 MED ORDER — SODIUM CHLORIDE 0.9 % IV SOLN
Freq: Once | INTRAVENOUS | Status: AC
  Administered 2012-04-25: 17:00:00 via INTRAVENOUS

## 2012-04-25 MED ORDER — SODIUM CHLORIDE 0.9 % IJ SOLN
3.0000 mL | Freq: Two times a day (BID) | INTRAMUSCULAR | Status: DC
  Administered 2012-04-26 (×2): 3 mL via INTRAVENOUS

## 2012-04-25 MED ORDER — SODIUM CHLORIDE 0.9 % IV SOLN
INTRAVENOUS | Status: DC
  Administered 2012-04-26: 100 mL via INTRAVENOUS
  Administered 2012-04-27 – 2012-04-28 (×3): via INTRAVENOUS

## 2012-04-25 MED ORDER — ACETAMINOPHEN 650 MG RE SUPP: 650.0000 mg | Freq: Four times a day (QID) | RECTAL | Status: DC | PRN

## 2012-04-25 MED ORDER — ASPIRIN EC 81 MG PO TBEC
81.0000 mg | DELAYED_RELEASE_TABLET | Freq: Every day | ORAL | Status: DC
  Administered 2012-04-26 – 2012-04-28 (×3): 81 mg via ORAL
  Filled 2012-04-25 (×3): qty 1

## 2012-04-25 MED ORDER — ADULT MULTIVITAMIN W/MINERALS CH
1.0000 | ORAL_TABLET | Freq: Every day | ORAL | Status: DC
  Administered 2012-04-26 – 2012-04-28 (×3): 1 via ORAL
  Filled 2012-04-25 (×3): qty 1

## 2012-04-25 MED ORDER — PANTOPRAZOLE SODIUM 40 MG IV SOLR
40.0000 mg | Freq: Once | INTRAVENOUS | Status: AC
  Administered 2012-04-25: 40 mg via INTRAVENOUS
  Filled 2012-04-25: qty 40

## 2012-04-25 MED ORDER — FOLIC ACID 1 MG PO TABS
1.0000 mg | ORAL_TABLET | Freq: Every day | ORAL | Status: DC
  Administered 2012-04-26 – 2012-04-28 (×3): 1 mg via ORAL
  Filled 2012-04-25 (×3): qty 1

## 2012-04-25 MED ORDER — METOPROLOL TARTRATE 1 MG/ML IV SOLN
5.0000 mg | Freq: Once | INTRAVENOUS | Status: AC
  Administered 2012-04-25: 5 mg via INTRAVENOUS
  Filled 2012-04-25: qty 5

## 2012-04-25 MED ORDER — SODIUM CHLORIDE 0.9 % IV BOLUS (SEPSIS)
500.0000 mL | Freq: Once | INTRAVENOUS | Status: AC
  Administered 2012-04-25: 500 mL via INTRAVENOUS

## 2012-04-25 MED ORDER — LORAZEPAM 2 MG/ML IJ SOLN
1.0000 mg | Freq: Four times a day (QID) | INTRAMUSCULAR | Status: DC | PRN
  Administered 2012-04-26: 1 mg via INTRAVENOUS
  Filled 2012-04-25 (×2): qty 1

## 2012-04-25 MED ORDER — LORAZEPAM 2 MG/ML IJ SOLN: 0.0000 mg | Freq: Two times a day (BID) | INTRAMUSCULAR | Status: DC

## 2012-04-25 MED ORDER — LORAZEPAM 2 MG/ML IJ SOLN
0.0000 mg | Freq: Four times a day (QID) | INTRAMUSCULAR | Status: DC
  Administered 2012-04-25: 1 mg via INTRAVENOUS
  Administered 2012-04-26: 2 mg via INTRAVENOUS
  Administered 2012-04-26: 1 mg via INTRAVENOUS
  Filled 2012-04-25 (×2): qty 1

## 2012-04-25 MED ORDER — IOHEXOL 350 MG/ML SOLN
80.0000 mL | Freq: Once | INTRAVENOUS | Status: AC | PRN
  Administered 2012-04-25: 80 mL via INTRAVENOUS

## 2012-04-25 MED ORDER — PANTOPRAZOLE SODIUM 40 MG IV SOLR
40.0000 mg | Freq: Two times a day (BID) | INTRAVENOUS | Status: DC
  Administered 2012-04-25 – 2012-04-27 (×5): 40 mg via INTRAVENOUS
  Filled 2012-04-25 (×8): qty 40

## 2012-04-25 MED ORDER — ACETAMINOPHEN 325 MG PO TABS: 650.0000 mg | ORAL_TABLET | Freq: Four times a day (QID) | ORAL | Status: DC | PRN
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
## 2012-04-25 NOTE — ED Notes (Signed)
Brent Gay, EMT completed ED EKG; results given to Brent Morn, NP

## 2012-04-25 NOTE — ED Notes (Signed)
Pt ambulated to restroom. 

## 2012-04-25 NOTE — ED Notes (Signed)
Notified Felicie Morn, NP about pts request for zofran.

## 2012-04-25 NOTE — ED Notes (Signed)
Pt states he feels like he is going through withdrawals

## 2012-04-25 NOTE — ED Notes (Addendum)
Per EMS pt reports chest pain starting last night around 2100 that started at left chest and radiate to left arm and SOB. EMS reports BP 130/80, sinus tach range 130-140 O2 94-95% on room air. Pt nausea with vomiting he states "I have been drinking a lot." EMS given baby 4 Asprin and 1 nitro and pt refused to take more nitro.

## 2012-04-25 NOTE — ED Notes (Signed)
Felicie Morn, NP notified of pts increased in pain, headache, and nausea.

## 2012-04-25 NOTE — ED Notes (Signed)
Patient transported to CT 

## 2012-04-25 NOTE — ED Notes (Signed)
Report received from Kuch, RN  

## 2012-04-25 NOTE — ED Notes (Signed)
Pt states pain has increased to 9/10 and "spreading up." Pt states nausea has returned. Pt states headache increased too

## 2012-04-25 NOTE — ED Notes (Signed)
Pt c/o increased pain and nausea.

## 2012-04-25 NOTE — ED Notes (Signed)
Robyn A, PA notified of pts pain and CIWA scale. PA at bedside.

## 2012-04-25 NOTE — ED Notes (Signed)
Felicie Morn, NP notified of pts chest pain and anxiety level.

## 2012-04-25 NOTE — ED Notes (Signed)
Report given to floor nurse, Massie Bougie, RN. Notified of pts CIWA and dose of last Ativan. Nurse had no further questions upon report. Preparing pt for transport to floor.

## 2012-04-25 NOTE — ED Notes (Signed)
EKG given to EDP 1142
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
## 2012-04-25 NOTE — ED Provider Notes (Signed)
Patient care assumed from Dr. Weldon Inches. Patient moved to CDU to see if chest pain improved once alcohol starts to wear off. Currently patient complaining of 8/10 chest pain located on left side of chest. States he has a headache. Nausea has subsided. Denies shortness of breath, vomiting, lightheadedness, dizziness. On exam patient is resting in bed in NAD. HENT negative. Heart tachycardic with regular rhythm. Lungs CTAB. Speech slurred. AAOx3. 1mg  ativan given due to withdrawal.  Case discussed with Felicie Morn, NP who will take over care of patient at this time.  Trevor Mace, PA-C 04/25/12 1525

## 2012-04-26 DIAGNOSIS — R072 Precordial pain: Secondary | ICD-10-CM

## 2012-04-26 LAB — URINALYSIS, ROUTINE W REFLEX MICROSCOPIC
Bilirubin Urine: NEGATIVE
Hgb urine dipstick: NEGATIVE
Ketones, ur: >80 mg/dL — AB
Specific Gravity, Urine: >1.046 — ABNORMAL HIGH (ref 1.005–1.030)
Urobilinogen, UA: 0.2 mg/dL (ref 0.0–1.0)

## 2012-04-26 LAB — CBC WITH DIFFERENTIAL/PLATELET
Basophils Absolute: 0.1 10*3/uL (ref 0.0–0.1)
Eosinophils Relative: 3 % (ref 0–5)
HCT: 37.5 % — ABNORMAL LOW (ref 39.0–52.0)
Hemoglobin: 13 g/dL (ref 13.0–17.0)
Lymphocytes Relative: 25 % (ref 12–46)
Lymphs Abs: 2.3 10*3/uL (ref 0.7–4.0)
MCV: 89.5 fL (ref 78.0–100.0)
Monocytes Absolute: 0.7 10*3/uL (ref 0.1–1.0)
Monocytes Relative: 8 % (ref 3–12)
Neutro Abs: 5.9 10*3/uL (ref 1.7–7.7)
RBC: 4.19 MIL/uL — ABNORMAL LOW (ref 4.22–5.81)
WBC: 9.2 10*3/uL (ref 4.0–10.5)

## 2012-04-26 LAB — COMPREHENSIVE METABOLIC PANEL
Albumin: 3.2 g/dL — ABNORMAL LOW (ref 3.5–5.2)
BUN: 6 mg/dL (ref 6–23)
Calcium: 7.5 mg/dL — ABNORMAL LOW (ref 8.4–10.5)
GFR calc Af Amer: >90 mL/min (ref >90)
Glucose, Bld: 73 mg/dL (ref 70–99)
Potassium: 3.8 mEq/L (ref 3.5–5.1)
Total Protein: 6.1 g/dL (ref 6.0–8.3)

## 2012-04-26 LAB — RAPID URINE DRUG SCREEN, HOSP PERFORMED
Amphetamines: NOT DETECTED
Barbiturates: NOT DETECTED
Benzodiazepines: NOT DETECTED
Cocaine: NOT DETECTED
Tetrahydrocannabinol: NOT DETECTED

## 2012-04-26 LAB — TROPONIN I: Troponin I: <0.3 ng/mL (ref <0.30)

## 2012-04-26 LAB — MRSA PCR SCREENING: MRSA by PCR: NEGATIVE

## 2012-04-26 LAB — LIPID PANEL
Cholesterol: 149 mg/dL (ref 0–200)
HDL: 61 mg/dL (ref >39)
LDL Cholesterol: 73 mg/dL (ref 0–99)
Triglycerides: 76 mg/dL (ref <150)

## 2012-04-26 LAB — GLUCOSE, CAPILLARY: Glucose-Capillary: 89 mg/dL (ref 70–99)

## 2012-04-26 MED ORDER — LORAZEPAM 2 MG/ML IJ SOLN
0.0000 mg | INTRAMUSCULAR | Status: DC | PRN
  Administered 2012-04-26 – 2012-04-27 (×6): 2 mg via INTRAVENOUS
  Filled 2012-04-26 (×6): qty 1

## 2012-04-26 MED ORDER — ONDANSETRON HCL 4 MG/2ML IJ SOLN
4.0000 mg | INTRAMUSCULAR | Status: DC
  Administered 2012-04-26 – 2012-04-28 (×6): 4 mg via INTRAVENOUS
  Filled 2012-04-26 (×6): qty 2

## 2012-04-26 MED ORDER — FAMOTIDINE 20 MG PO TABS
20.0000 mg | ORAL_TABLET | Freq: Two times a day (BID) | ORAL | Status: DC
  Administered 2012-04-26 – 2012-04-28 (×5): 20 mg via ORAL
  Filled 2012-04-26 (×6): qty 1

## 2012-04-26 MED ORDER — LORAZEPAM 2 MG/ML IJ SOLN: 0.0000 mg | Freq: Two times a day (BID) | INTRAMUSCULAR | Status: DC

## 2012-04-26 MED ORDER — ONDANSETRON HCL 4 MG PO TABS
4.0000 mg | ORAL_TABLET | ORAL | Status: DC
  Administered 2012-04-27 – 2012-04-28 (×4): 4 mg via ORAL
  Filled 2012-04-26 (×15): qty 1

## 2012-04-26 MED ORDER — OXYCODONE-ACETAMINOPHEN 5-325 MG PO TABS
1.0000 | ORAL_TABLET | Freq: Four times a day (QID) | ORAL | Status: DC | PRN
  Administered 2012-04-26 – 2012-04-28 (×7): 1 via ORAL
  Filled 2012-04-26 (×7): qty 1

## 2012-04-26 MED ORDER — LORAZEPAM 2 MG/ML IJ SOLN: 1.0000 mg | Freq: Once | INTRAMUSCULAR | Status: AC

## 2012-04-26 NOTE — Clinical Social Work Psychosocial (Signed)
     Clinical Social Work Department BRIEF PSYCHOSOCIAL ASSESSMENT 04/26/2012  Patient:  Brent Gay, Brent Gay     Account Number:  [id number]     Admit date:  04/25/2012  Clinical Social Worker:  Hulan Fray  Date/Time:  04/26/2012 12:17 PM  Referred by:  Physician  Date Referred:  04/26/2012 Referred for  Other - See comment   Other Referral:   resources for etoh   Interview type:  Patient Other interview type:    PSYCHOSOCIAL DATA Living Status:  ALONE Admitted from facility:   Level of care:   Primary support name:  Virginio Isidore ([phone]) Primary support relationship to patient:  PARENT Degree of support available:   adequate    CURRENT CONCERNS Current Concerns  Substance Abuse   Other Concerns:    SOCIAL WORK ASSESSMENT / PLAN Clinical Social Worker received referral for ETOH resources. CSW introduced self and explained reason for visit. Patient was agreeable to speak with CSW regarding substance abuse. CSW completed SBIRT and patient scored a 24, which is high risk. Patient reported that his drink of choice is vodka. CSW provided information for intensive outpatient and residential treatment programs. Patient reported that he was not interested in AA resources. Patient also reported interest in HUD program and requested CSW provide information, which was provided to patient. Patient did not report any further concerns.   Assessment/plan status:  Information/Referral to [facility] Other assessment/ plan:   Information/referral to community resources:   Substance Abuse treatment programs  HUD information    PATIENTS/FAMILYS RESPONSE TO PLAN OF CARE: Patient was appreciative of CSW's visit and concern and resources provided.

## 2012-04-26 NOTE — Progress Notes (Signed)
PROGRESS NOTE  Brent Gay:[id number] DOB: [birth year] DOA: 04/25/2012 PCP: Sheila Oats, MD  Brief narrative: 46 yr old male with knownETOH use admitted 11.12.13 with CP  Past medical history-As per Problem list Chart reviewed as below-                                             Multiple ED visits between 09/2011 till dateCP. Hematemesis, Fatty liver  Admission 04/03/08 for Intoxication  Admission 03/12/08 for non-cardiac CP/ETOh withdrawal  Admission 12/27/07 for ETOH hepatitis. Chronic liverdisease  Admit 10/19/07 for severe Met Acidosis 2/2 to ETOH [?]  Consultants:  None as yet  Procedures:  none  Antibiotics:  none   Subjective  Awakens at bedside but states he has a headache. No further issues at present regards to chest pain. Patient states he started drinking this weekend and stop drinking 04/24/2012 and 9 PM and then started developing chest pain He states he drank a gallon of vodka in that period of time     Objective    Interim History: NAd  Telemetry: Normal sinus rhythm  Objective: Filed Vitals:   04/26/12 0000 04/26/12 0100 04/26/12 0317 04/26/12 0319  BP: 123/73 132/74  150/87  Pulse:  105 102   Temp:   98.4 F (36.9 C)   TempSrc:   Oral   Resp: 21 18 17 15   Height:      Weight:      SpO2:  93% 99%     Intake/Output Summary (Last 24 hours) at 04/26/12 0744 Last data filed at 04/26/12 0500  Gross per 24 hour  Intake    708 ml  Output    820 ml  Net   -112 ml    Exam:  General: Alert but sleepy Caucasian male no apparent distress.-Not tremulous Cardiovascular: S1-S2 regular rate rhythm, no murmurs noted Respiratory: Clinically clear Abdomen: Soft nontender nondistended no rebound Skin no rash or edema Neuro no tremors  Data Reviewed: Basic Metabolic Panel:  Lab 04/26/12 9562 04/25/12 1213  NA 134* 140  K 3.8 4.2  CL 97 97  CO2 21 16*  GLUCOSE 73 101*  BUN 6 8  CREATININE 0.71 0.77  CALCIUM 7.5* 8.7  MG -- --   PHOS -- --   Liver Function Tests:  Lab 04/26/12 0410 04/25/12 1213  AST 22 31  ALT 14 19  ALKPHOS 66 92  BILITOT 0.8 0.5  PROT 6.1 8.2  ALBUMIN 3.2* 4.3    Lab 04/26/12 0410 04/25/12 1213  LIPASE 33 22  AMYLASE -- --   No results found for this basename: AMMONIA:5 in the last 168 hours CBC:  Lab 04/26/12 0410 04/25/12 1213  WBC 9.2 8.0  NEUTROABS 5.9 5.5  HGB 13.0 16.5  HCT 37.5* 46.2  MCV 89.5 88.8  PLT 144* 193   Cardiac Enzymes:  Lab 04/26/12 0410 04/25/12 2254 04/25/12 2013 04/25/12 1214  CKTOTAL -- -- -- --  CKMB -- -- -- --  CKMBINDEX -- -- -- --  TROPONINI <0.30 <0.30 <0.30 <0.30   BNP: No components found with this basename: POCBNP:5 CBG:  Lab 04/26/12 0601 04/25/12 2358  GLUCAP 89 96    Recent Results (from the past 240 hour(s))  MRSA PCR SCREENING     Status: Normal   Collection Time   04/25/12 10:38 PM      Component Value  Range Status Comment   MRSA by PCR NEGATIVE  NEGATIVE Final      Studies:              All Imaging reviewed and is as per above notation   Scheduled Meds:   . [COMPLETED] sodium chloride   Intravenous Once  . aspirin EC  81 mg Oral Daily  . folic acid  1 mg Oral Daily  . LORazepam  0-4 mg Intravenous Q6H   Followed by  . LORazepam  0-4 mg Intravenous Q12H  . [COMPLETED] LORazepam  1 mg Intravenous Once  . [COMPLETED] LORazepam  1 mg Intravenous Once  . [COMPLETED] metoprolol  5 mg Intravenous Once  . [COMPLETED]  morphine injection  4 mg Intravenous Once  . [COMPLETED]  morphine injection  4 mg Intravenous Once  . multivitamin with minerals  1 tablet Oral Daily  . [COMPLETED] ondansetron (ZOFRAN) IV  4 mg Intravenous Once  . [COMPLETED] ondansetron (ZOFRAN) IV  4 mg Intravenous Once  . [COMPLETED] pantoprazole (PROTONIX) IV  40 mg Intravenous Once  . pantoprazole (PROTONIX) IV  40 mg Intravenous Q12H  . [COMPLETED] sodium chloride  500 mL Intravenous Once  . sodium chloride  3 mL Intravenous Q12H  . thiamine   100 mg Oral Daily   Or  . thiamine  100 mg Intravenous Daily   Continuous Infusions:   . sodium chloride 100 mL/hr at 04/25/12 2236     Assessment/Plan: 1. Chest pain, unlikely cardiogenic-patient requesting a meal, given CT chest done 04/25/2029 showed no findings and patient has been admitted for this from before, low threshold to workup further. Will get LDL to risk stratify. 2. Ethanol habituation-patient be kept on CIWA-he will need counseling from social work and will need a primary care physician 3. Elevated blood rash or without diagnosis of hypertension-monitor blood pressure. If above 140/90 sustained, will add medications  Code Status: Full Family Communication: None at bedside Disposition Plan: Keep in step down given suspect patient will need round-the-clock supervision with regards to withdraw from alcohol   Pleas Koch, MD  [facility] Pager [phone] 04/26/2012, 7:44 AM    LOS: 1 day

## 2012-04-26 NOTE — Care Management Note (Addendum)
    Page 1 of 1   04/28/2012     10:48:43 AM   CARE MANAGEMENT NOTE 04/28/2012  Patient:  Brent Gay, Brent Gay   Account Number:  [id number]  Date Initiated:  04/26/2012  Documentation initiated by:  Junius Creamer  Subjective/Objective Assessment:   adm w ch pain     Action/Plan:   lives alone   Anticipated DC Date:  04/28/2012   Anticipated DC Plan:  HOME/SELF CARE  In-house referral  Clinical Social Worker      DC Associate Professor  CM consult  [facility]  Medication Assistance      Choice offered to / List presented to:             Status of service:  Completed, signed off Medicare Important Message given?   (If response is "NO", the following Medicare IM given date fields will be blank) Date Medicare IM given:   Date Additional Medicare IM given:    Discharge Disposition:  HOME/SELF CARE  Per UR Regulation:  Reviewed for med. necessity/level of care/duration of stay  If discussed at Long Length of Stay Meetings, dates discussed:    Comments:  04/28/12 10:34 Letha Cape RN, BSN [phone] patient is for dc today,  Patient 's follow up appt is scheduled for Dec 18 which is first available, if he needs an apt before then he should be able to be a walk in from 10-12 and 2-3:30 every day ecept for Wed. Most of patient's meds are over the counter, he should not need med ast.  11/13 9:30a debbie dowell rn,bsn [phone] will give pt primary care resource sheet and prescription discount card that may help w copay on brand name meds.

## 2012-04-26 NOTE — ED Provider Notes (Signed)
Medical screening examination/treatment/procedure(s) were conducted as a shared visit with non-physician practitioner(s) and myself.  I personally evaluated the patient during the encounter  Cheri Guppy, MD 04/26/12 [phone]

## 2012-04-27 ENCOUNTER — Encounter (HOSPITAL_COMMUNITY): Payer: Self-pay | Admitting: General Practice

## 2012-04-27 MED ORDER — INFLUENZA VIRUS VACC SPLIT PF IM SUSP
0.5000 mL | INTRAMUSCULAR | Status: AC
  Administered 2012-04-28: 0.5 mL via INTRAMUSCULAR
  Filled 2012-04-27: qty 0.5

## 2012-04-27 MED ORDER — LORAZEPAM 2 MG/ML IJ SOLN: 0.0000 mg | Freq: Two times a day (BID) | INTRAMUSCULAR | Status: DC

## 2012-04-27 MED ORDER — LORAZEPAM 2 MG/ML IJ SOLN
0.0000 mg | INTRAMUSCULAR | Status: DC | PRN
  Administered 2012-04-27 – 2012-04-28 (×5): 2 mg via INTRAVENOUS
  Filled 2012-04-27 (×5): qty 1

## 2012-04-27 NOTE — Progress Notes (Signed)
PROGRESS NOTE  Brent Gay ZOX:[id number] DOB: 02-27-1966 DOA: 04/25/2012 PCP: Sheila Oats, MD  Brief narrative: 46 yr old male with known ETOH use admitted 11.12.13 with CP. This was thought to be low probability cardiogenic and his troponins ruled out as being negative. He had an echocardiogram done 04/26/2012 that showed an EF that was preserved at 66 5% with PA peak pressure 34 mmHg and no wall motion abnormalities.  Past medical history-As per Problem list Chart reviewed as below-                                             Multiple ED visits between 09/2011 till dateCP. Hematemesis, Fatty liver  Admission 04/03/08 for Intoxication  Admission 03/12/08 for non-cardiac CP/ETOh withdrawal  Admission 12/27/07 for ETOH hepatitis. Chronic liverdisease  Admit 10/19/07 for severe Met Acidosis 2/2 to ETOH [?]  Consultants:  None as yet  Procedures:  none  Antibiotics:  none   Subjective  Sleepy but arousable. States he feels overall pain States that he feels a little tremulous Is passing a lot of urine-is on IV fluids. Denies any burning in the urine. Denies any chest pain at present.  Objective    Interim History: NAd  Telemetry: Normal sinus rhythm-telemetry discontinued 04/27/2029  Objective: Filed Vitals:   04/27/12 0700 04/27/12 0752 04/27/12 0755 04/27/12 0800  BP: 137/90   145/86  Pulse: 101 101 97   Temp:   98 F (36.7 C)   TempSrc:   Oral   Resp:      Height:      Weight:      SpO2:   94%     Intake/Output Summary (Last 24 hours) at 04/27/12 8119 Last data filed at 04/27/12 0800  Gross per 24 hour  Intake   3584 ml  Output   1860 ml  Net   1724 ml    Exam:  General: Alert but sleepy Caucasian male no apparent distress. Mildly tremulous Cardiovascular: S1-S2 regular rate rhythm, no murmurs noted Respiratory: Clinically clear Abdomen: Soft nontender nondistended no rebound Skin no rash or edema Neuro no tremors  Data Reviewed: Basic  Metabolic Panel:  Lab 04/26/12 1478 04/25/12 1213  NA 134* 140  K 3.8 4.2  CL 97 97  CO2 21 16*  GLUCOSE 73 101*  BUN 6 8  CREATININE 0.71 0.77  CALCIUM 7.5* 8.7  MG -- --  PHOS -- --   Liver Function Tests:  Lab 04/26/12 0410 04/25/12 1213  AST 22 31  ALT 14 19  ALKPHOS 66 92  BILITOT 0.8 0.5  PROT 6.1 8.2  ALBUMIN 3.2* 4.3    Lab 04/26/12 0410 04/25/12 1213  LIPASE 33 22  AMYLASE -- --   No results found for this basename: AMMONIA:5 in the last 168 hours CBC:  Lab 04/26/12 0410 04/25/12 1213  WBC 9.2 8.0  NEUTROABS 5.9 5.5  HGB 13.0 16.5  HCT 37.5* 46.2  MCV 89.5 88.8  PLT 144* 193   Cardiac Enzymes:  Lab 04/26/12 0934 04/26/12 0410 04/25/12 2254 04/25/12 2013 04/25/12 1214  CKTOTAL -- -- -- -- --  CKMB -- -- -- -- --  CKMBINDEX -- -- -- -- --  TROPONINI <0.30 <0.30 <0.30 <0.30 <0.30   BNP: No components found with this basename: POCBNP:5 CBG:  Lab 04/26/12 0601 04/25/12 2358  GLUCAP 89 96  Recent Results (from the past 240 hour(s))  MRSA PCR SCREENING     Status: Normal   Collection Time   04/25/12 10:38 PM      Component Value Range Status Comment   MRSA by PCR NEGATIVE  NEGATIVE Final      Studies:              All Imaging reviewed and is as per above notation   Scheduled Meds:    . aspirin EC  81 mg Oral Daily  . famotidine  20 mg Oral BID  . folic acid  1 mg Oral Daily  . LORazepam  0-4 mg Intravenous Q12H  . [COMPLETED] LORazepam  1 mg Intravenous Once  . multivitamin with minerals  1 tablet Oral Daily  . ondansetron  4 mg Oral Q4H   Or  . ondansetron (ZOFRAN) IV  4 mg Intravenous Q4H  . pantoprazole (PROTONIX) IV  40 mg Intravenous Q12H  . sodium chloride  3 mL Intravenous Q12H  . thiamine  100 mg Oral Daily   Or  . thiamine  100 mg Intravenous Daily  . [DISCONTINUED] LORazepam  0-4 mg Intravenous Q6H  . [DISCONTINUED] LORazepam  0-4 mg Intravenous Q12H   Continuous Infusions:    . sodium chloride 100 mL/hr at  04/27/12 0043     Assessment/Plan: 1. Chest pain, unlikely cardiogenic-patient requesting a meal, given CT chest done 04/25/2029 showed no findings and patient has been admitted for this from before, low threshold to workup further. Will get LDL to risk stratify. 2. Hyperlipidemia-LDL 73, total cholesterol 161-WRUEAV modification needed as an outpatient 3. Delirium tremens-patient can be scaled back from the Cipro protocol to every 4 hourly when necessary Ativan. His UA score was ranging between 5 and 15 today and patient will need to be below 5 consecutively 3 or 4 times prior to discharge. 4. 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 He expressed an interest in talking with her again. 5. Elevated blood rash or without diagnosis of hypertension-monitor blood pressure. If above 140/90 sustained, will add medications  Code Status: Full Family Communication: None at bedside Disposition Plan: Refer to telemetry/MedSurg bed today 04/27/2012, potential discharge 04/28/2012   Pleas Koch, MD  [facility] Pager [phone] 04/27/2012, 9:22 AM    LOS: 2 days

## 2012-04-27 NOTE — Progress Notes (Signed)
Rx script pharmacy discount card given back to patient.

## 2012-04-28 DIAGNOSIS — R0789 Other chest pain: Secondary | ICD-10-CM

## 2012-04-28 MED ORDER — FAMOTIDINE 20 MG PO TABS: 20.0000 mg | ORAL_TABLET | Freq: Two times a day (BID) | ORAL | Status: DC

## 2012-04-28 MED ORDER — PANTOPRAZOLE SODIUM 40 MG PO TBEC
40.0000 mg | DELAYED_RELEASE_TABLET | Freq: Two times a day (BID) | ORAL | Status: DC
  Administered 2012-04-28: 40 mg via ORAL
  Filled 2012-04-28: qty 1

## 2012-04-28 MED ORDER — ASPIRIN 81 MG PO TBEC: 81.0000 mg | DELAYED_RELEASE_TABLET | Freq: Every day | ORAL | Status: DC

## 2012-04-28 MED ORDER — LORAZEPAM 1 MG PO TABS: 0.0000 mg | ORAL_TABLET | Freq: Four times a day (QID) | ORAL | Status: DC

## 2012-04-28 MED ORDER — LORAZEPAM 1 MG PO TABS: 0.0000 mg | ORAL_TABLET | Freq: Two times a day (BID) | ORAL | Status: DC

## 2012-04-28 NOTE — Discharge Summary (Signed)
Physician Discharge Summary  Brent Gay ZOX:[id number] DOB: 12-25-1965 DOA: 04/25/2012  PCP: Sheila Oats, MD  Admit date: 04/25/2012 Discharge date: 04/28/2012  Time spent: 20 minutes  Recommendations for Outpatient Follow-up:  1. Patient recommended to seek ETOH cessation classes 2. Consider outpatient Anti-htn meds  Discharge Diagnoses:  Principal Problem:  *Chest pain Active Problems:  ALCOHOL ABUSE   Discharge Condition: Fair  Diet recommendation: low salt  Filed Weights   04/25/12 2200 04/27/12 1933  Weight: 96.662 kg (213 lb 1.6 oz) 96.7 kg (213 lb 3 oz)    History of present illness:  46 yr old male with known ETOH use admitted 11.12.13 with CP. This was thought to be low probability cardiogenic and his troponins ruled out as being negative. He had an echocardiogram done 04/26/2012 that showed an EF that was preserved at 66 5% with PA peak pressure 34 mmHg and no wall motion abnormalities.   Past medical history-As per Problem list  Chart reviewed as below-  Multiple ED visits between 09/2011 till dateCP. Hematemesis, Fatty liver  Admission 04/03/08 for Intoxication  Admission 03/12/08 for non-cardiac CP/ETOh withdrawal  Admission 12/27/07 for ETOH hepatitis. Chronic liverdisease  Admit 10/19/07 for severe Met Acidosis 2/2 to ETOH [?]  Consultants:  None as yet  Procedures:  none  Antibiotics:  none  Hospital Course:  1. Chest pain, unlikely cardiogenic-patient requesting a meal, given CT chest done 04/25/2029 showed no findings and patient has been admitted for this from before, low threshold to workup further. LDL was 73-ECHO as above was normal 2. Hyperlipidemia-LDL 73, total cholesterol 811-[id number] modification needed as an outpatient 3. Delirium tremens-patient can be scaled back from the Cipro protocol to every 4 hourly when necessary Ativan. His UA score was ranging between 5 and patient was noted to be "clockwatching'; and he requested ativan as Rx,  which was declined 4. 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 He expressed an interest in talking with her again. 5. Elevated blood rash or without diagnosis of hypertension-monitor blood pressure. If above 140/90 sustained, will add medication    Discharge Exam: Filed Vitals:   04/27/12 1933 04/27/12 2159 04/28/12 0522 04/28/12 0843  BP:  144/87 138/78 158/90  Pulse:  82 83 81  Temp:  98.3 F (36.8 C) 97.6 F (36.4 C)   TempSrc:   Oral   Resp:  18 18   Height: 5\' 9"  (1.753 m)     Weight: 96.7 kg (213 lb 3 oz)     SpO2:  98% 97%    Sleepy but rousable, in NAD  General: nad, no pallor Cardiovascular: CTa b Respiratory:  s1 s2 no m/r/g  Discharge Instructions  Discharge Orders    Future Orders Please Complete By Expires   Diet - low sodium heart healthy      Increase activity slowly      Call MD for:  temperature >100.4      Call MD for:  redness, tenderness, or signs of infection (pain, swelling, redness, odor or green/yellow discharge around incision site)      Call MD for:  hives      Call MD for:  extreme fatigue          Medication List     As of 04/28/2012  9:10 AM  STOP taking these medications         aspirin-sod bicarb-citric acid 325 MG Tbef   Commonly known as: ALKA-SELTZER      TAKE these medications         aspirin 81 MG EC tablet   Take 1 tablet (81 mg total) by mouth daily.      famotidine 20 MG tablet   Commonly known as: PEPCID   Take 1 tablet (20 mg total) by mouth 2 (two) times daily.      multivitamin with minerals Tabs   Take 1 tablet by mouth daily.          The results of significant diagnostics from this hospitalization (including imaging, microbiology, ancillary and laboratory) are listed below for reference.     Significant Diagnostic Studies: Ct Angio Chest Pe W/cm &/or Wo Cm  04/25/2012  *RADIOLOGY REPORT*  Clinical Data: Tachycardia  CT ANGIOGRAPHY CHEST  Technique:  Multidetector CT imaging of the chest using the standard protocol during bolus administration of intravenous contrast. Multiplanar reconstructed images including MIPs were obtained and reviewed to evaluate the vascular anatomy.  Contrast: 80mL OMNIPAQUE IOHEXOL 350 MG/ML SOLN  Comparison: 11/27/2009  Findings:  Lungs/pleura: The lungs are clear.  No airspace consolidation identified.  No pleural effusion noted.  Heart/Mediastinum: The heart size is mildly enlarged. No pericardial effusion.  No significant mediastinal or hilar adenopathy.  No abnormal filling defects are identified within the main pulmonary artery.  Upper abdomen: Imaging through the upper abdomen shows fatty infiltration throughout the liver.  Bones/Musculoskeletal:    No acute osseous abnormalities noted.  IMPRESSION:  1.  No evidence for acute pulmonary embolus. 2.  Fatty infiltration of the liver.   Original Report Authenticated By: Signa Kell, M.D.    Dg Chest Port 1 View  04/25/2012  *RADIOLOGY REPORT*  Clinical Data: Left chest pain, nausea, vomiting  PORTABLE CHEST - 1 VIEW  Comparison: Portable exam 1231 hours compared to 12/28/2011  Findings: Borderline enlargement of cardiac silhouette. Mediastinal contours and pulmonary vascularity normal. Lungs clear. Chronic peribronchial thickening. No pleural effusion or pneumothorax. Bones unremarkable.  IMPRESSION: Mild chronic bronchitic changes.   Original Report Authenticated By: Ulyses Southward, M.D.     Microbiology: Recent Results (from the past 240 hour(s))  MRSA PCR SCREENING     Status: Normal   Collection Time   04/25/12 10:38 PM      Component Value Range Status Comment   MRSA by PCR NEGATIVE  NEGATIVE Final      Labs: Basic Metabolic Panel:  Lab 04/26/12 1610 04/25/12 1213  NA 134* 140  K 3.8 4.2  CL 97  97  CO2 21 16*  GLUCOSE 73 101*  BUN 6 8  CREATININE 0.71 0.77  CALCIUM 7.5* 8.7  MG -- --  PHOS -- --   Liver Function Tests:  Lab 04/26/12 0410 04/25/12 1213  AST 22 31  ALT 14 19  ALKPHOS 66 92  BILITOT 0.8 0.5  PROT 6.1 8.2  ALBUMIN 3.2* 4.3    Lab 04/26/12 0410 04/25/12 1213  LIPASE 33 22  AMYLASE -- --   No results found for this basename: AMMONIA:5 in the last 168 hours CBC:  Lab 04/26/12 0410 04/25/12 1213  WBC 9.2 8.0  NEUTROABS 5.9 5.5  HGB 13.0 16.5  HCT 37.5* 46.2  MCV 89.5 88.8  PLT 144* 193   Cardiac Enzymes:  Lab 04/26/12 0934 04/26/12 0410 04/25/12 2254 04/25/12 2013 04/25/12 1214  CKTOTAL -- -- -- -- --  CKMB -- -- -- -- --  CKMBINDEX -- -- -- -- --  TROPONINI <0.30 <0.30 <0.30 <0.30 <0.30   BNP: BNP (last 3 results) No results found for this basename: PROBNP:3 in the last 8760 hours CBG:  Lab 04/26/12 0601 04/25/12 2358  GLUCAP 89 96       Signed:  Sandy Haye, JAI-GURMUKH  [facility] 04/28/2012, 9:10 AM

## 2012-04-28 NOTE — Progress Notes (Signed)
Clinical Social Work  CSW received handoff from 2900 unit CSW regarding patient's status. CSW met with patient at bedside and introduced myself. Patient reports that PTA he was living at the [facility].  Patient reports that he is considering moving to an [facility] in order to assist him with staying sober. CSW provided patient with available [facility] available. Patient reports struggles with having enough food since his food stamps were reduced. CSW provided patient with list of food pantries in the area. Patient reports he will use taxi to transport back to hotel and has no further questions at this time. CSW is signing off but available if further needs arise.  [place], [place] [phone]

## 2012-04-28 NOTE — Progress Notes (Signed)
[name] to be D/C'd Home per MD order.  Discharge instructions reviewed and discussed with the patient, all questions and concerns answered. Copy of instructionsgiven to patient.   Jenny, Omdahl  Home Medication Instructions RUE:[id number]   Printed on:04/28/12 1314  Medication Information                    Multiple Vitamin (MULTIVITAMIN WITH MINERALS) TABS Take 1 tablet by mouth daily.           aspirin EC 81 MG EC tablet Take 1 tablet (81 mg total) by mouth daily.           famotidine (PEPCID) 20 MG tablet Take 1 tablet (20 mg total) by mouth 2 (two) times daily.             Patients skin is clean, dry and intact no evidence of skin break down. IV site discontinued and catheter remains intact. Site without signs and symptoms of complications. Dressing and pressure applied.  Patient escorted at [facility] student Bing Quarry 04/28/2012 1:14 PM

## 2012-04-29 ENCOUNTER — Emergency Department (HOSPITAL_COMMUNITY)
Admission: EM | Admit: 2012-04-29 | Discharge: 2012-04-29 | Payer: Self-pay | Attending: Emergency Medicine | Admitting: Emergency Medicine

## 2012-04-29 DIAGNOSIS — Y929 Unspecified place or not applicable: Secondary | ICD-10-CM | POA: Insufficient documentation

## 2012-04-29 DIAGNOSIS — S0990XA Unspecified injury of head, initial encounter: Secondary | ICD-10-CM | POA: Insufficient documentation

## 2012-04-29 DIAGNOSIS — F10929 Alcohol use, unspecified with intoxication, unspecified: Secondary | ICD-10-CM

## 2012-04-29 DIAGNOSIS — S0081XA Abrasion of other part of head, initial encounter: Secondary | ICD-10-CM

## 2012-04-29 DIAGNOSIS — Z8719 Personal history of other diseases of the digestive system: Secondary | ICD-10-CM | POA: Insufficient documentation

## 2012-04-29 DIAGNOSIS — I10 Essential (primary) hypertension: Secondary | ICD-10-CM | POA: Insufficient documentation

## 2012-04-29 DIAGNOSIS — Y939 Activity, unspecified: Secondary | ICD-10-CM | POA: Insufficient documentation

## 2012-04-29 DIAGNOSIS — X58XXXA Exposure to other specified factors, initial encounter: Secondary | ICD-10-CM | POA: Insufficient documentation

## 2012-04-29 DIAGNOSIS — G8929 Other chronic pain: Secondary | ICD-10-CM | POA: Insufficient documentation

## 2012-04-29 DIAGNOSIS — M549 Dorsalgia, unspecified: Secondary | ICD-10-CM | POA: Insufficient documentation

## 2012-04-29 DIAGNOSIS — IMO0002 Reserved for concepts with insufficient information to code with codable children: Secondary | ICD-10-CM | POA: Insufficient documentation

## 2012-04-29 LAB — CBC
MCH: 31.5 pg (ref 26.0–34.0)
MCHC: 36.1 g/dL — ABNORMAL HIGH (ref 30.0–36.0)
Platelets: 141 10*3/uL — ABNORMAL LOW (ref 150–400)
RDW: 15.1 % (ref 11.5–15.5)

## 2012-04-29 LAB — URINALYSIS, ROUTINE W REFLEX MICROSCOPIC
Bilirubin Urine: NEGATIVE
Glucose, UA: NEGATIVE mg/dL
Hgb urine dipstick: NEGATIVE
Specific Gravity, Urine: 1.012 (ref 1.005–1.030)

## 2012-04-29 LAB — RAPID URINE DRUG SCREEN, HOSP PERFORMED
Amphetamines: NOT DETECTED
Barbiturates: NOT DETECTED
Benzodiazepines: NOT DETECTED
Cocaine: NOT DETECTED
Opiates: NOT DETECTED
Tetrahydrocannabinol: NOT DETECTED

## 2012-04-29 MED ORDER — ACETAMINOPHEN 325 MG PO TABS
650.0000 mg | ORAL_TABLET | Freq: Once | ORAL | Status: DC
  Filled 2012-04-29: qty 2
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
## 2012-04-29 NOTE — ED Notes (Signed)
Pt yelling at [employer] at bedside. Pt swearing. Remains in handcuffs per [employer].
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
## 2012-04-29 NOTE — ED Notes (Signed)
Went to bedside to administer Tylenol. Patient refused Tylenol. Discharge instructions reviewed with patient. Patient not receptive to instruction.
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
## 2012-04-29 NOTE — ED Notes (Signed)
ZOX:[id number]<AV> Expected date:<BR> Expected time:<BR> Means of arrival:<BR> Comments:<BR> Hold 17 for EMS

## 2012-05-01 ENCOUNTER — Encounter (HOSPITAL_COMMUNITY): Payer: Self-pay | Admitting: Emergency Medicine

## 2012-05-08 ENCOUNTER — Emergency Department (HOSPITAL_COMMUNITY)
Admission: EM | Admit: 2012-05-08 | Discharge: 2012-05-08 | Disposition: A | Discharge status: Home / Self Care | Payer: Self-pay | Attending: Emergency Medicine | Admitting: Emergency Medicine

## 2012-05-08 ENCOUNTER — Emergency Department (HOSPITAL_COMMUNITY): Payer: Self-pay

## 2012-05-08 ENCOUNTER — Encounter (HOSPITAL_COMMUNITY): Payer: Self-pay | Admitting: Emergency Medicine

## 2012-05-08 DIAGNOSIS — K219 Gastro-esophageal reflux disease without esophagitis: Secondary | ICD-10-CM | POA: Insufficient documentation

## 2012-05-08 DIAGNOSIS — M549 Dorsalgia, unspecified: Secondary | ICD-10-CM | POA: Insufficient documentation

## 2012-05-08 DIAGNOSIS — K709 Alcoholic liver disease, unspecified: Secondary | ICD-10-CM | POA: Insufficient documentation

## 2012-05-08 DIAGNOSIS — R0789 Other chest pain: Secondary | ICD-10-CM | POA: Insufficient documentation

## 2012-05-08 DIAGNOSIS — I10 Essential (primary) hypertension: Secondary | ICD-10-CM | POA: Insufficient documentation

## 2012-05-08 DIAGNOSIS — F1010 Alcohol abuse, uncomplicated: Secondary | ICD-10-CM | POA: Insufficient documentation

## 2012-05-08 DIAGNOSIS — Z87891 Personal history of nicotine dependence: Secondary | ICD-10-CM | POA: Insufficient documentation

## 2012-05-08 DIAGNOSIS — K2970 Gastritis, unspecified, without bleeding: Secondary | ICD-10-CM | POA: Insufficient documentation

## 2012-05-08 DIAGNOSIS — G8929 Other chronic pain: Secondary | ICD-10-CM | POA: Insufficient documentation

## 2012-05-08 DIAGNOSIS — R079 Chest pain, unspecified: Secondary | ICD-10-CM

## 2012-05-08 LAB — BASIC METABOLIC PANEL
BUN: 6 mg/dL (ref 6–23)
CO2: 22 mEq/L (ref 19–32)
Calcium: 8.8 mg/dL (ref 8.4–10.5)
Chloride: 95 mEq/L — ABNORMAL LOW (ref 96–112)
Creatinine, Ser: 0.76 mg/dL (ref 0.50–1.35)
GFR calc Af Amer: >90 mL/min (ref >90)
GFR calc non Af Amer: >90 mL/min (ref >90)
Glucose, Bld: 118 mg/dL — ABNORMAL HIGH (ref 70–99)
Potassium: 3.5 mEq/L (ref 3.5–5.1)
Sodium: 138 mEq/L (ref 135–145)

## 2012-05-08 LAB — CBC WITH DIFFERENTIAL/PLATELET
Basophils Absolute: 0.1 10*3/uL (ref 0.0–0.1)
Basophils Relative: 1 % (ref 0–1)
Eosinophils Absolute: 0.1 10*3/uL (ref 0.0–0.7)
Eosinophils Relative: 2 % (ref 0–5)
HCT: 43.1 % (ref 39.0–52.0)
Hemoglobin: 15.4 g/dL (ref 13.0–17.0)
Lymphocytes Relative: 36 % (ref 12–46)
Lymphs Abs: 2.1 10*3/uL (ref 0.7–4.0)
MCH: 31.2 pg (ref 26.0–34.0)
MCHC: 35.7 g/dL (ref 30.0–36.0)
MCV: 87.2 fL (ref 78.0–100.0)
Monocytes Absolute: 0.7 10*3/uL (ref 0.1–1.0)
Monocytes Relative: 12 % (ref 3–12)
Neutro Abs: 2.9 10*3/uL (ref 1.7–7.7)
Neutrophils Relative %: 49 % (ref 43–77)
Platelets: 202 10*3/uL (ref 150–400)
RBC: 4.94 MIL/uL (ref 4.22–5.81)
RDW: 15.2 % (ref 11.5–15.5)
WBC: 5.9 10*3/uL (ref 4.0–10.5)

## 2012-05-08 LAB — TROPONIN I: Troponin I: <0.3 ng/mL (ref <0.30)

## 2012-05-08 MED ORDER — LORAZEPAM 2 MG/ML IJ SOLN
2.0000 mg | Freq: Once | INTRAMUSCULAR | Status: AC
  Administered 2012-05-08: 2 mg via INTRAVENOUS
  Filled 2012-05-08: qty 1

## 2012-05-08 MED ORDER — ADULT MULTIVITAMIN W/MINERALS CH
1.0000 | ORAL_TABLET | Freq: Once | ORAL | Status: AC
  Administered 2012-05-08: 1 via ORAL
  Filled 2012-05-08: qty 1

## 2012-05-08 MED ORDER — ONDANSETRON HCL 4 MG/2ML IJ SOLN
4.0000 mg | Freq: Once | INTRAMUSCULAR | Status: AC
  Administered 2012-05-08: 4 mg via INTRAVENOUS
  Filled 2012-05-08: qty 2

## 2012-05-08 MED ORDER — SODIUM CHLORIDE 0.9 % IV BOLUS (SEPSIS)
1000.0000 mL | Freq: Once | INTRAVENOUS | Status: AC
  Administered 2012-05-08: 1000 mL via INTRAVENOUS

## 2012-05-08 NOTE — ED Notes (Signed)
Pt given happy meal multiple milks and bus pass w/ happy meal to go pt states he understands all d/c instructions dr Juleen China into speak to again

## 2012-05-08 NOTE — ED Notes (Signed)
Pt provided milk and [place] sandwich.

## 2012-05-08 NOTE — ED Notes (Signed)
Cp x 12 hours has been drinking all night he states and also wants detox. Lives at hotel sleep right

## 2012-05-08 NOTE — ED Notes (Signed)
Denies SI or HI has had 325 asa and 1 nitro no relief w/ nitro has been vomiting he states
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
## 2012-05-11 ENCOUNTER — Emergency Department (HOSPITAL_COMMUNITY)
Admission: EM | Admit: 2012-05-11 | Discharge: 2012-05-12 | Disposition: A | Discharge status: Home / Self Care | Payer: Self-pay | Attending: Emergency Medicine | Admitting: Emergency Medicine

## 2012-05-11 ENCOUNTER — Emergency Department (HOSPITAL_COMMUNITY): Payer: Self-pay

## 2012-05-11 ENCOUNTER — Encounter (HOSPITAL_COMMUNITY): Payer: Self-pay | Admitting: Adult Health

## 2012-05-11 DIAGNOSIS — Z87891 Personal history of nicotine dependence: Secondary | ICD-10-CM | POA: Insufficient documentation

## 2012-05-11 DIAGNOSIS — G8929 Other chronic pain: Secondary | ICD-10-CM | POA: Insufficient documentation

## 2012-05-11 DIAGNOSIS — R0602 Shortness of breath: Secondary | ICD-10-CM | POA: Insufficient documentation

## 2012-05-11 DIAGNOSIS — R079 Chest pain, unspecified: Secondary | ICD-10-CM | POA: Insufficient documentation

## 2012-05-11 DIAGNOSIS — Z8719 Personal history of other diseases of the digestive system: Secondary | ICD-10-CM | POA: Insufficient documentation

## 2012-05-11 DIAGNOSIS — I10 Essential (primary) hypertension: Secondary | ICD-10-CM | POA: Insufficient documentation

## 2012-05-11 DIAGNOSIS — M549 Dorsalgia, unspecified: Secondary | ICD-10-CM | POA: Insufficient documentation

## 2012-05-11 DIAGNOSIS — F1020 Alcohol dependence, uncomplicated: Secondary | ICD-10-CM | POA: Insufficient documentation

## 2012-05-11 DIAGNOSIS — R110 Nausea: Secondary | ICD-10-CM | POA: Insufficient documentation

## 2012-05-11 LAB — CBC WITH DIFFERENTIAL/PLATELET
Basophils Absolute: 0.1 10*3/uL (ref 0.0–0.1)
Basophils Relative: 1 % (ref 0–1)
Eosinophils Absolute: 0.3 10*3/uL (ref 0.0–0.7)
Hemoglobin: 15.3 g/dL (ref 13.0–17.0)
MCH: 30.8 pg (ref 26.0–34.0)
MCHC: 34.9 g/dL (ref 30.0–36.0)
Monocytes Absolute: 0.6 10*3/uL (ref 0.1–1.0)
Monocytes Relative: 9 % (ref 3–12)
Neutrophils Relative %: 36 % — ABNORMAL LOW (ref 43–77)
RDW: 15.1 % (ref 11.5–15.5)

## 2012-05-11 LAB — POCT I-STAT TROPONIN I: Troponin i, poc: 0 ng/mL (ref 0.00–0.08)

## 2012-05-11 LAB — COMPREHENSIVE METABOLIC PANEL
Albumin: 3.8 g/dL (ref 3.5–5.2)
BUN: <3 mg/dL — ABNORMAL LOW (ref 6–23)
Creatinine, Ser: 0.74 mg/dL (ref 0.50–1.35)
Potassium: 2.9 mEq/L — ABNORMAL LOW (ref 3.5–5.1)
Total Protein: 7.5 g/dL (ref 6.0–8.3)

## 2012-05-11 LAB — ETHANOL: Alcohol, Ethyl (B): 380 mg/dL — ABNORMAL HIGH (ref 0–11)

## 2012-05-11 MED ORDER — GI COCKTAIL ~~LOC~~
30.0000 mL | Freq: Once | ORAL | Status: AC
  Administered 2012-05-11: 30 mL via ORAL
  Filled 2012-05-11 (×2): qty 30

## 2012-05-11 MED ORDER — LORAZEPAM 1 MG PO TABS: 1.0000 mg | ORAL_TABLET | Freq: Three times a day (TID) | ORAL | Status: DC | PRN

## 2012-05-11 MED ORDER — ALUM & MAG HYDROXIDE-SIMETH 200-200-20 MG/5ML PO SUSP: 30.0000 mL | ORAL | Status: DC | PRN

## 2012-05-11 MED ORDER — POTASSIUM CHLORIDE CRYS ER 20 MEQ PO TBCR
40.0000 meq | EXTENDED_RELEASE_TABLET | Freq: Once | ORAL | Status: AC
  Administered 2012-05-11: 40 meq via ORAL
  Filled 2012-05-11: qty 2

## 2012-05-11 MED ORDER — ZOLPIDEM TARTRATE 5 MG PO TABS: 5.0000 mg | ORAL_TABLET | Freq: Every evening | ORAL | Status: DC | PRN

## 2012-05-11 MED ORDER — LORAZEPAM 2 MG/ML IJ SOLN
1.0000 mg | Freq: Once | INTRAMUSCULAR | Status: AC
  Administered 2012-05-11: 1 mg via INTRAVENOUS
  Filled 2012-05-11: qty 1

## 2012-05-11 MED ORDER — LORAZEPAM 1 MG PO TABS
0.0000 mg | ORAL_TABLET | Freq: Four times a day (QID) | ORAL | Status: DC
  Administered 2012-05-12: 1 mg via ORAL
  Filled 2012-05-11: qty 1

## 2012-05-11 MED ORDER — FOLIC ACID 1 MG PO TABS
1.0000 mg | ORAL_TABLET | Freq: Every day | ORAL | Status: DC
  Administered 2012-05-11: 1 mg via ORAL
  Filled 2012-05-11 (×2): qty 1

## 2012-05-11 MED ORDER — ACETAMINOPHEN 325 MG PO TABS
650.0000 mg | ORAL_TABLET | ORAL | Status: DC | PRN
  Filled 2012-05-11: qty 2

## 2012-05-11 MED ORDER — ONDANSETRON HCL 8 MG PO TABS
4.0000 mg | ORAL_TABLET | Freq: Three times a day (TID) | ORAL | Status: DC | PRN
  Filled 2012-05-11: qty 1

## 2012-05-11 MED ORDER — LORAZEPAM 2 MG/ML IJ SOLN
1.0000 mg | Freq: Four times a day (QID) | INTRAMUSCULAR | Status: DC | PRN
  Administered 2012-05-12: 1 mg via INTRAVENOUS
  Filled 2012-05-11: qty 1

## 2012-05-11 MED ORDER — VITAMIN B-1 100 MG PO TABS: 100.0000 mg | ORAL_TABLET | Freq: Every day | ORAL | Status: DC

## 2012-05-11 MED ORDER — SODIUM CHLORIDE 0.9 % IV BOLUS (SEPSIS)
1000.0000 mL | Freq: Once | INTRAVENOUS | Status: AC
  Administered 2012-05-11: 1000 mL via INTRAVENOUS

## 2012-05-11 MED ORDER — IBUPROFEN 200 MG PO TABS
600.0000 mg | ORAL_TABLET | Freq: Three times a day (TID) | ORAL | Status: DC | PRN
  Filled 2012-05-11: qty 3

## 2012-05-11 MED ORDER — ADULT MULTIVITAMIN W/MINERALS CH
1.0000 | ORAL_TABLET | Freq: Every day | ORAL | Status: DC
  Administered 2012-05-11: 1 via ORAL
  Filled 2012-05-11 (×2): qty 1

## 2012-05-11 MED ORDER — THIAMINE HCL 100 MG/ML IJ SOLN
100.0000 mg | Freq: Every day | INTRAMUSCULAR | Status: DC
  Administered 2012-05-11: 100 mg via INTRAVENOUS
  Filled 2012-05-11: qty 2

## 2012-05-11 MED ORDER — POTASSIUM CHLORIDE CRYS ER 20 MEQ PO TBCR: 40.0000 meq | EXTENDED_RELEASE_TABLET | Freq: Once | ORAL | Status: DC

## 2012-05-11 MED ORDER — LORAZEPAM 1 MG PO TABS: 0.0000 mg | ORAL_TABLET | Freq: Two times a day (BID) | ORAL | Status: DC

## 2012-05-11 MED ORDER — MORPHINE SULFATE 4 MG/ML IJ SOLN
4.0000 mg | Freq: Once | INTRAMUSCULAR | Status: AC
  Administered 2012-05-11: 4 mg via INTRAVENOUS
  Filled 2012-05-11: qty 1

## 2012-05-11 MED ORDER — LORAZEPAM 2 MG/ML IJ SOLN
2.0000 mg | Freq: Once | INTRAMUSCULAR | Status: DC
  Filled 2012-05-11: qty 1

## 2012-05-11 MED ORDER — LORAZEPAM 1 MG PO TABS
1.0000 mg | ORAL_TABLET | Freq: Four times a day (QID) | ORAL | Status: DC | PRN
  Administered 2012-05-11 – 2012-05-12 (×2): 1 mg via ORAL
  Filled 2012-05-11 (×4): qty 1

## 2012-05-11 NOTE — ED Notes (Signed)
Pt asked for urine sample, reports he is unable to get up. Pt given urinal to use.
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
## 2012-05-11 NOTE — ED Notes (Signed)
Presents with chest pain that began last night at 9 pm described as "lightening bolts" pain radiates from left chest and down left side. Associated with nausea and sob. Drinks a half gallon of liquor a day. Pt with slurred speech and smell of ETOH.. Pt states his last drink was at midnight.

## 2012-05-12 LAB — RAPID URINE DRUG SCREEN, HOSP PERFORMED
Amphetamines: NOT DETECTED
Benzodiazepines: NOT DETECTED
Opiates: POSITIVE — AB

## 2012-05-12 MED ORDER — LORAZEPAM 1 MG PO TABS: 2.0000 mg | ORAL_TABLET | Freq: Once | ORAL | Status: DC

## 2012-05-12 MED ORDER — ONDANSETRON 4 MG PO TBDP
ORAL_TABLET | ORAL | Status: AC
  Administered 2012-05-12: 4 mg
  Filled 2012-05-12: qty 1

## 2012-05-12 MED ORDER — ONDANSETRON 4 MG PO TBDP
ORAL_TABLET | ORAL | Status: AC
  Filled 2012-05-12: qty 1

## 2012-05-12 NOTE — ED Notes (Signed)
Walked back by to give pt ativan and found pt asleep in bed. Will recheck pt.

## 2012-05-12 NOTE — ED Notes (Signed)
ACT team at bedside.  

## 2012-05-12 NOTE — ED Provider Notes (Signed)
Arrangements had been made and the patient was accepted for inpatient treatment at detox, however he has now decided that he does not want to do this.  He tells me he is going to speak with his sponsor when he gets home.  He is requesting to be discharged.  I have spoken with him and he would like a prescription for ativan which I do not believe is appropriate as I quite certain he is going to go home and resume drinking.  I will give him one dose prior to discharge.    Geoffery Lyons, MD 05/12/12 [phone]
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
## 2012-05-12 NOTE — ED Notes (Signed)
Pt. Requesting morphine. Dr. Zachary George declined. Pt. Educated on reasoning. Pt. Refused offered pain medicine. Pt. Currently asleep in bed.

## 2012-05-12 NOTE — ED Notes (Signed)
12 lead performed-provided to dr Judd Lien for review.

## 2012-05-12 NOTE — ED Notes (Signed)
Pt personal belongings returned. Pt verified contents.

## 2012-05-12 NOTE — ED Notes (Signed)
Inventory personal belongings and secured in locker 5.

## 2012-05-12 NOTE — ED Provider Notes (Signed)
Medical screening examination/treatment/procedure(s) were conducted as a shared visit with non-physician practitioner(s) and myself.  I personally evaluated the patient during the encounter  Intoxicated male, requesting alcohol detox and c/o chest pain since 9pm that is constant, with "lightening bolts" going down arm and leg.  Reproducible chest wall tenderness.  EKG unchanged and nonischemic.  Similar visits in past with chest pain rule out admission 2 weeks ago and negative CTPE. Atypical for ACS. Will pursue alcohol treatment options when sober.  Mild tachycardia, no active withdrawal. IVF, CIWA.   Glynn Octave, MD 05/12/12 1159

## 2012-05-12 NOTE — ED Notes (Signed)
Pt. States "the withdrawal symptoms are really starting to kick in". Reports anxiety, nausea, and HA.

## 2012-05-12 NOTE — ED Notes (Signed)
Pt. Called out to nurses station asking to speak to this nurse. When I went into pt. Room, pt eyes closed, resting, did not respond to this nurses entrance. Let pt. Rest. Will reassess

## 2012-05-12 NOTE — ED Notes (Signed)
Pt. Called out again, stating "I'm getting very agitated".

## 2012-05-19 ENCOUNTER — Emergency Department (HOSPITAL_COMMUNITY)
Admission: EM | Admit: 2012-05-19 | Discharge: 2012-05-22 | Disposition: A | Discharge status: Home / Self Care | Payer: Self-pay | Attending: Emergency Medicine | Admitting: Emergency Medicine

## 2012-05-19 ENCOUNTER — Emergency Department (HOSPITAL_COMMUNITY): Payer: Self-pay

## 2012-05-19 ENCOUNTER — Encounter (HOSPITAL_COMMUNITY): Payer: Self-pay | Admitting: Neurology

## 2012-05-19 DIAGNOSIS — Z87891 Personal history of nicotine dependence: Secondary | ICD-10-CM | POA: Insufficient documentation

## 2012-05-19 DIAGNOSIS — Z8719 Personal history of other diseases of the digestive system: Secondary | ICD-10-CM | POA: Insufficient documentation

## 2012-05-19 DIAGNOSIS — I10 Essential (primary) hypertension: Secondary | ICD-10-CM | POA: Insufficient documentation

## 2012-05-19 DIAGNOSIS — M549 Dorsalgia, unspecified: Secondary | ICD-10-CM | POA: Insufficient documentation

## 2012-05-19 DIAGNOSIS — G8929 Other chronic pain: Secondary | ICD-10-CM | POA: Insufficient documentation

## 2012-05-19 DIAGNOSIS — F1010 Alcohol abuse, uncomplicated: Secondary | ICD-10-CM | POA: Insufficient documentation

## 2012-05-19 LAB — BASIC METABOLIC PANEL
CO2: 26 mEq/L (ref 19–32)
Creatinine, Ser: 0.87 mg/dL (ref 0.50–1.35)
GFR calc Af Amer: >90 mL/min (ref >90)
Potassium: 2.9 mEq/L — ABNORMAL LOW (ref 3.5–5.1)

## 2012-05-19 LAB — CBC WITH DIFFERENTIAL/PLATELET
Basophils Absolute: 0.1 10*3/uL (ref 0.0–0.1)
HCT: 42.5 % (ref 39.0–52.0)
Lymphocytes Relative: 48 % — ABNORMAL HIGH (ref 12–46)
Monocytes Absolute: 0.7 10*3/uL (ref 0.1–1.0)
Neutro Abs: 1.7 10*3/uL (ref 1.7–7.7)
Platelets: 129 10*3/uL — ABNORMAL LOW (ref 150–400)
RBC: 4.71 MIL/uL (ref 4.22–5.81)
RDW: 15.9 % — ABNORMAL HIGH (ref 11.5–15.5)
WBC: 5 10*3/uL (ref 4.0–10.5)

## 2012-05-19 LAB — TROPONIN I: Troponin I: <0.3 ng/mL (ref <0.30)

## 2012-05-19 MED ORDER — KETOROLAC TROMETHAMINE 30 MG/ML IJ SOLN
30.0000 mg | Freq: Once | INTRAMUSCULAR | Status: AC
  Administered 2012-05-19: 30 mg via INTRAVENOUS
  Filled 2012-05-19: qty 1

## 2012-05-19 MED ORDER — VITAMIN B-1 100 MG PO TABS
100.0000 mg | ORAL_TABLET | Freq: Once | ORAL | Status: AC
  Administered 2012-05-19: 100 mg via ORAL
  Filled 2012-05-19: qty 1

## 2012-05-19 MED ORDER — LORAZEPAM 1 MG PO TABS
1.0000 mg | ORAL_TABLET | Freq: Once | ORAL | Status: AC
  Administered 2012-05-19: 1 mg via ORAL
  Filled 2012-05-19: qty 1

## 2012-05-19 MED ORDER — SODIUM CHLORIDE 0.9 % IV BOLUS (SEPSIS)
1000.0000 mL | Freq: Once | INTRAVENOUS | Status: AC
  Administered 2012-05-19: 1000 mL via INTRAVENOUS

## 2012-05-19 MED ORDER — POTASSIUM CHLORIDE CRYS ER 20 MEQ PO TBCR
80.0000 meq | EXTENDED_RELEASE_TABLET | Freq: Once | ORAL | Status: AC
  Administered 2012-05-19: 80 meq via ORAL
  Filled 2012-05-19: qty 4

## 2012-05-19 MED ORDER — MAGNESIUM SULFATE 40 MG/ML IJ SOLN
2.0000 g | Freq: Once | INTRAMUSCULAR | Status: AC
  Administered 2012-05-19: 2 g via INTRAVENOUS
  Filled 2012-05-19: qty 50

## 2012-05-19 NOTE — ED Notes (Signed)
EMS-pt reports sudden onset of left sided chest pain radiating down left arm, pt reports 1 episode of vomiting but pt reports drinking a fifth of vodka already today. Pt received 324ASA en route and 2 nitros. Pt reports no relief with nitro. Rates pain 10/10.

## 2012-05-19 NOTE — ED Provider Notes (Signed)
Patient has received IV fluids, medications.  Appears to be sleeping when entering room, eyes closed, even, non-labored respirations.  Upon awakening, he reports feeling anxious with return of chest discomfort, but is not in obvious distress.  Per plan, patient to be discharged home with prescription for ativan and referral to out-patient alcohol rehab.  Jimmye Norman, NP 05/20/12 [phone]
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
## 2012-05-19 NOTE — ED Notes (Signed)
Pt sleeping/ resting, easily arousable, calm, NAD, intermitantly asking for pain med and DT med.

## 2012-05-19 NOTE — ED Notes (Signed)
Pt is sleeping at this time. Snoring, unlabored respirations.

## 2012-05-19 NOTE — ED Notes (Signed)
Pt alert, NAD, calm, cooperative, interactive, skin W&D, resps e/u, speaking in clear complete sentences, to room via w/c, given socks & urinals at [facility], NS IVF infusing. Asking for morphine for pain from chest to foot ( a twinge, it sticks & zaps me).

## 2012-05-20 ENCOUNTER — Encounter (HOSPITAL_COMMUNITY): Payer: Self-pay | Admitting: *Deleted

## 2012-05-20 LAB — RAPID URINE DRUG SCREEN, HOSP PERFORMED
Amphetamines: NOT DETECTED
Barbiturates: NOT DETECTED
Opiates: NOT DETECTED
Tetrahydrocannabinol: NOT DETECTED

## 2012-05-20 LAB — POCT I-STAT, CHEM 8
Calcium, Ion: 1.03 mmol/L — ABNORMAL LOW (ref 1.12–1.23)
Glucose, Bld: 92 mg/dL (ref 70–99)
HCT: 39 % (ref 39.0–52.0)
Hemoglobin: 13.3 g/dL (ref 13.0–17.0)
Potassium: 4 mEq/L (ref 3.5–5.1)

## 2012-05-20 MED ORDER — LORAZEPAM 1 MG PO TABS
1.0000 mg | ORAL_TABLET | Freq: Four times a day (QID) | ORAL | Status: DC | PRN
  Administered 2012-05-20 – 2012-05-22 (×6): 1 mg via ORAL
  Filled 2012-05-20: qty 1

## 2012-05-20 MED ORDER — LORAZEPAM 2 MG/ML IJ SOLN: 1.0000 mg | Freq: Four times a day (QID) | INTRAMUSCULAR | Status: DC | PRN

## 2012-05-20 MED ORDER — FOLIC ACID 1 MG PO TABS
1.0000 mg | ORAL_TABLET | Freq: Every day | ORAL | Status: DC
  Administered 2012-05-20 – 2012-05-22 (×3): 1 mg via ORAL
  Filled 2012-05-20 (×3): qty 1

## 2012-05-20 MED ORDER — ALUM & MAG HYDROXIDE-SIMETH 200-200-20 MG/5ML PO SUSP
30.0000 mL | ORAL | Status: DC | PRN
  Administered 2012-05-20 – 2012-05-21 (×2): 30 mL via ORAL
  Filled 2012-05-20 (×2): qty 30

## 2012-05-20 MED ORDER — IBUPROFEN 200 MG PO TABS
600.0000 mg | ORAL_TABLET | Freq: Three times a day (TID) | ORAL | Status: DC | PRN
  Administered 2012-05-20 – 2012-05-21 (×3): 600 mg via ORAL
  Filled 2012-05-20 (×3): qty 3

## 2012-05-20 MED ORDER — LORAZEPAM 1 MG PO TABS
1.0000 mg | ORAL_TABLET | Freq: Once | ORAL | Status: AC
  Administered 2012-05-20: 1 mg via ORAL
  Filled 2012-05-20: qty 1

## 2012-05-20 MED ORDER — ZOLPIDEM TARTRATE 5 MG PO TABS
5.0000 mg | ORAL_TABLET | Freq: Every evening | ORAL | Status: DC | PRN
  Administered 2012-05-20 – 2012-05-21 (×2): 5 mg via ORAL
  Filled 2012-05-20 (×2): qty 1

## 2012-05-20 MED ORDER — LORAZEPAM 1 MG PO TABS
1.0000 mg | ORAL_TABLET | Freq: Three times a day (TID) | ORAL | Status: DC | PRN
  Administered 2012-05-20 – 2012-05-22 (×4): 1 mg via ORAL
  Filled 2012-05-20 (×6): qty 1
  Filled 2012-05-20: qty 2
  Filled 2012-05-20 (×6): qty 1

## 2012-05-20 MED ORDER — KETOROLAC TROMETHAMINE 30 MG/ML IJ SOLN
30.0000 mg | Freq: Once | INTRAMUSCULAR | Status: AC
  Administered 2012-05-20: 30 mg via INTRAVENOUS
  Filled 2012-05-20: qty 1

## 2012-05-20 MED ORDER — ACETAMINOPHEN 325 MG PO TABS
650.0000 mg | ORAL_TABLET | ORAL | Status: DC | PRN
  Administered 2012-05-21 – 2012-05-22 (×3): 650 mg via ORAL
  Filled 2012-05-20 (×3): qty 2

## 2012-05-20 MED ORDER — ADULT MULTIVITAMIN W/MINERALS CH
1.0000 | ORAL_TABLET | Freq: Every day | ORAL | Status: DC
  Administered 2012-05-20 – 2012-05-22 (×3): 1 via ORAL
  Filled 2012-05-20 (×3): qty 1

## 2012-05-20 MED ORDER — LORAZEPAM 1 MG PO TABS: 1.0000 mg | ORAL_TABLET | Freq: Two times a day (BID) | ORAL | Status: DC

## 2012-05-20 MED ORDER — NICOTINE 21 MG/24HR TD PT24
21.0000 mg | MEDICATED_PATCH | Freq: Once | TRANSDERMAL | Status: AC
  Administered 2012-05-20: 21 mg via TRANSDERMAL

## 2012-05-20 MED ORDER — LORAZEPAM 1 MG PO TABS
0.0000 mg | ORAL_TABLET | Freq: Four times a day (QID) | ORAL | Status: AC
  Administered 2012-05-20 (×2): 1 mg via ORAL
  Administered 2012-05-21: 2 mg via ORAL
  Administered 2012-05-21 – 2012-05-22 (×4): 1 mg via ORAL
  Filled 2012-05-20 (×3): qty 1

## 2012-05-20 MED ORDER — LORAZEPAM 1 MG PO TABS: 0.0000 mg | ORAL_TABLET | Freq: Two times a day (BID) | ORAL | Status: DC

## 2012-05-20 MED ORDER — ONDANSETRON HCL 8 MG PO TABS
4.0000 mg | ORAL_TABLET | Freq: Three times a day (TID) | ORAL | Status: DC | PRN
  Administered 2012-05-20: 4 mg via ORAL
  Administered 2012-05-20: 12:00:00 via ORAL
  Filled 2012-05-20 (×3): qty 1

## 2012-05-20 MED ORDER — THIAMINE HCL 100 MG/ML IJ SOLN: 100.0000 mg | Freq: Every day | INTRAMUSCULAR | Status: DC

## 2012-05-20 MED ORDER — NICOTINE 21 MG/24HR TD PT24
21.0000 mg | MEDICATED_PATCH | Freq: Every day | TRANSDERMAL | Status: DC
  Administered 2012-05-20 – 2012-05-22 (×3): 21 mg via TRANSDERMAL
  Filled 2012-05-20 (×4): qty 1

## 2012-05-20 MED ORDER — ONDANSETRON HCL 4 MG/2ML IJ SOLN
INTRAMUSCULAR | Status: AC
  Administered 2012-05-20: 4 mg via INTRAVENOUS
  Filled 2012-05-20: qty 2

## 2012-05-20 MED ORDER — VITAMIN B-1 100 MG PO TABS
100.0000 mg | ORAL_TABLET | Freq: Every day | ORAL | Status: DC
  Administered 2012-05-20 – 2012-05-22 (×3): 100 mg via ORAL
  Filled 2012-05-20 (×3): qty 1

## 2012-05-20 MED ORDER — SODIUM CHLORIDE 0.9 % IV SOLN: Freq: Once | INTRAVENOUS | Status: DC

## 2012-05-20 MED ORDER — ONDANSETRON HCL 4 MG/2ML IJ SOLN
4.0000 mg | Freq: Once | INTRAMUSCULAR | Status: AC
  Administered 2012-05-20: 4 mg via INTRAVENOUS

## 2012-05-20 MED ORDER — ONDANSETRON 4 MG PO TBDP
4.0000 mg | ORAL_TABLET | Freq: Four times a day (QID) | ORAL | Status: DC | PRN
  Administered 2012-05-20 – 2012-05-22 (×5): 4 mg via ORAL
  Filled 2012-05-20 (×5): qty 1

## 2012-05-20 NOTE — ED Notes (Signed)
Pt received from CDU. On arrival to pod c patient standing at desk asking for meds for his tremors and nausea and vomiting. Patient vomited approx . CIWA completed and zofran and ativan given.
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
## 2012-05-20 NOTE — ED Notes (Signed)
Pt lost nicotine patch. None seen on assessment. New patch ordered.

## 2012-05-20 NOTE — ED Notes (Signed)
Pt resting/ sleeping, easily arousable to entering room, NAD, calm, interactive, VSS. IVF infusing to gravity.

## 2012-05-20 NOTE — ED Notes (Signed)
No changes, pt sleeping, VSS, NAD, calm.

## 2012-05-20 NOTE — ED Notes (Signed)
No changes, pt sleeping, NAD, calm, ST on monitor, VSS.

## 2012-05-20 NOTE — Progress Notes (Signed)
Drinks 1/2 of vodka per day, is somewhat shaky.  Will place on alcohol withdrawal protocol.  He wanted morphine for chronic pain.  I reviewed the [place] Controlled Substance database, and pt did not have any prior prescriptions for narcotic pain medicine.  I advised him that we will treat him for alcohol withdrawal, but will not be giving him narcotic pain medicine.  He is going to [facility], to seek placement for alcohol abuse.

## 2012-05-20 NOTE — ED Provider Notes (Signed)
Medical screening examination/treatment/procedure(s) were conducted as a shared visit with non-physician practitioner(s) and myself.  I personally evaluated the patient during the encounter  Derwood Kaplan, MD 05/20/12 [phone]

## 2012-05-20 NOTE — ED Notes (Signed)
Lunch tray ordered for pt.

## 2012-05-20 NOTE — ED Notes (Addendum)
While attempting to d/c pt.Marland KitchenMarland KitchenPt now requesting detox, EDP aware, labs ordered, ACT notified. Denies SI/HI or AV hallucinations.

## 2012-05-20 NOTE — ED Notes (Signed)
Updated, given ice water, c/o anxiety and pain. Has been sleeping.

## 2012-05-21 ENCOUNTER — Other Ambulatory Visit: Payer: Self-pay

## 2012-05-21 MED ORDER — POTASSIUM CHLORIDE CRYS ER 20 MEQ PO TBCR: 40.0000 meq | EXTENDED_RELEASE_TABLET | Freq: Once | ORAL | Status: DC

## 2012-05-21 NOTE — ED Notes (Signed)
Pt alert food from dinner still at his bedside.  He reports that he is eating slowly.  nsr on the monitor skin warm and dry

## 2012-05-21 NOTE — ED Notes (Signed)
Tylenol given for a headache and some back pain.  He is requesting  Ativan early.  His reason he feels agitated.  No visible evidence of agitation.  Hands steady.  Requesting a sleep aid after he asked for a coke.

## 2012-05-21 NOTE — ED Notes (Signed)
Pt requesting another sleeping pill he just received 2 hours ago.

## 2012-05-21 NOTE — ED Notes (Signed)
ciwa 2

## 2012-05-21 NOTE — ED Notes (Signed)
i just read the act teams ciwa that does not correlate with mine.  Call placed to the act team for clarification
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
## 2012-05-21 NOTE — ED Notes (Signed)
Pt requesting ativan at 2100 and he also wants something for pain in his lt shoulder

## 2012-05-21 NOTE — ED Provider Notes (Signed)
Pt awaiting placement, no new issues  Brent Baker, MD 05/21/12 1233

## 2012-05-21 NOTE — ED Notes (Signed)
Sleeping, NAD, calm, no changes. 

## 2012-05-21 NOTE — ED Notes (Signed)
Dr Rhunette Croft asked about another sleeping med.  Negative pt informed

## 2012-05-21 NOTE — ED Notes (Signed)
Ativan given

## 2012-05-21 NOTE — ED Notes (Signed)
(  this RN familiar with this pt from previous night/ pt's arrival night).  Pt awake, resting in dark watching TV, alert, NAD, calm, interactive, states, "still feel pretty rough, nausea is gone, but not much appetite", desert food not eaten at [facility]. No requests made. CBIR.

## 2012-05-21 NOTE — ED Notes (Signed)
Resting/ sleeping, no changes, calm, NAD.

## 2012-05-21 NOTE — ED Notes (Signed)
The pt   Reports that he is feeling rough alert skin arm and dry mild headache no tremor.  nsr on the monitor

## 2012-05-21 NOTE — BH Assessment (Signed)
[facility] Assessment Progress Note   This clinician followed up on referral to [facility].  Clinician called at 04:50 on 12/08.  Brent Gay at [facility] said that patient was not appropriate to come back to their facility due to behavior towards male patients and staff.  Patient last week (11/29) said that he wanted detox at [facility].  Sponsorship was secured through [employer].  Patient then changed his mind and did not want services.  If patient persists in wanting detox today (12/08), on-coming clinician should make sure that patient understands that he will need to follow through on what he says that he wants.

## 2012-05-22 LAB — HEPATIC FUNCTION PANEL
ALT: 15 U/L (ref 0–53)
Alkaline Phosphatase: 68 U/L (ref 39–117)
Bilirubin, Direct: 0.1 mg/dL (ref 0.0–0.3)
Indirect Bilirubin: 0.4 mg/dL (ref 0.3–0.9)

## 2012-05-22 NOTE — ED Notes (Signed)
Pt up in the room.

## 2012-05-22 NOTE — BH Assessment (Signed)
Assessment Note   Update:  Gave pt referral to [facility] to [facility], as beds available.  Per Shayla at 1330, pt accepted to [facility].  Pt was requesting to leave, as he had to go pay a bill at his hotel, but was informed he had a bed at [facility].  Called [facility] to OK this with Shayla, and she stated this was OK, as long as ED called once pt discharged.  Pt is to drive himself to [facility].  Updated ED [facility] and ED staff.  Updated assessment disposition, completed assessment notification and faxed to [facility] to log.  Updated ED staff.     Disposition:  Disposition Disposition of Patient: Referred to;Inpatient treatment program Type of inpatient treatment program: Adult Patient referred to: [facility] (Pt accepted [facility])  On Site Evaluation by:   Reviewed with Physician:  Valene Bors, Rennis Harding 05/22/2012 1:41 PM

## 2012-05-22 NOTE — ED Notes (Signed)
The pt had to be awakened to get his ativan po.  No complaints of a headache or otherwise.

## 2012-05-22 NOTE — ED Notes (Signed)
Pt reports he is feeling better and request to see the ACT team. Denies any SI/HI. No distress noted.

## 2012-05-22 NOTE — ED Notes (Signed)
Person from [facility] called asking for  The authorization papers .  No act team here.  Will need to wait  For the day person to arrive.

## 2012-05-22 NOTE — ED Provider Notes (Addendum)
Pt awaiting etoh detox/rehab placement. Discussed w act team. Pt alert/content, nad. Placement pending.   Suzi Roots, MD 05/22/12 [phone]  Act team indicates pt to be discharge to go to [facility] for substance abuse treatment.   Suzi Roots, MD 05/22/12 [phone]

## 2012-05-22 NOTE — ED Notes (Signed)
The pt is drowsy and calmly telling me when his nes=xt ativan is due.  He asks for the q8 and q6 prn ativan on the hour

## 2012-05-22 NOTE — Discharge Instructions (Signed)
Go directly to [facility] for alcohol abuse treatment. Return to ER if worse, new symptoms, other concern.      Alcohol Withdrawal Anytime drug use is interfering with normal living activities it has become abuse. This includes problems with family and friends. Psychological dependence has developed when your mind tells you that the drug is needed. This is usually followed by physical dependence when a continuing increase of drugs are required to get the same feeling or "high." This is known as addiction or chemical dependency. A person's risk is much higher if there is a history of chemical dependency in the family. Mild Withdrawal Following Stopping Alcohol, When Addiction or Chemical Dependency Has Developed When a person has developed tolerance to alcohol, any sudden stopping of alcohol can cause uncomfortable physical symptoms. Most of the time these are mild and consist of tremors in the hands and increases in heart rate, breathing, and temperature. Sometimes these symptoms are associated with anxiety, panic attacks, and bad dreams. There may also be stomach upset. Normal sleep patterns are often interrupted with periods of inability to sleep (insomnia). This may last for 6 months. Because of this discomfort, many people choose to continue drinking to get rid of this discomfort and to try to feel normal. Severe Withdrawal with Decreased or No Alcohol Intake, When Addiction or Chemical Dependency Has Developed About five percent of alcoholics will develop signs of severe withdrawal when they stop using alcohol. One sign of this is development of generalized seizures (convulsions). Other signs of this are severe agitation and confusion. This may be associated with believing in things which are not real or seeing things which are not really there (delusions and hallucinations). Vitamin deficiencies are usually present if alcohol intake has been long-term. Treatment for this most often requires hospitalization  and close observation. Addiction can only be helped by stopping use of all chemicals. This is hard but may save your life. With continual alcohol use, possible outcomes are usually loss of self respect and esteem, violence, and death. Addiction cannot be cured but it can be stopped. This often requires outside help and the care of professionals. Treatment centers are listed in the yellow pages under Cocaine, Narcotics, and Alcoholics Anonymous. Most hospitals and clinics can refer you to a specialized care center. It is not necessary for you to go through the uncomfortable symptoms of withdrawal. Your caregiver can provide you with medicines that will help you through this difficult period. Try to avoid situations, friends, or drugs that made it possible for you to keep using alcohol in the past. Learn how to say no. It takes a long period of time to overcome addictions to all drugs, including alcohol. There may be many times when you feel as though you want a drink. After getting rid of the physical addiction and withdrawal, you will have a lessening of the craving which tells you that you need alcohol to feel normal. Call your caregiver if more support is needed. Learn who to talk to in your family and among your friends so that during these periods you can receive outside help. Alcoholics Anonymous (AA) has helped many people over the years. To get further help, contact AA or call your caregiver, counselor, or clergyperson. Al-Anon and Alateen are support groups for friends and family members of an alcoholic. The people who love and care for an alcoholic often need help, too. For information about these organizations, check your phone directory or call a local alcoholism treatment center.  SEEK  IMMEDIATE MEDICAL CARE IF:   You have a seizure.  You have a fever.  You experience uncontrolled vomiting or you vomit up blood. This may be bright red or look like black coffee grounds.  You have blood in the  stool. This may be bright red or appear as a black, tarry, bad-smelling stool.  You become lightheaded or faint. Do not drive if you feel this way. Have someone else drive you or call 161 for help.  You become more agitated or confused.  You develop uncontrolled anxiety.  You begin to see things that are not really there (hallucinate). Your caregiver has determined that you completely understand your medical condition, and that your mental state is back to normal. You understand that you have been treated for alcohol withdrawal, have agreed not to drink any alcohol for a minimum of 1 day, will not operate a car or other machinery for 24 hours, and have had an opportunity to ask any questions about your condition. Document Released: 03/10/2005 Document Revised: 08/23/2011 Document Reviewed: 01/17/2008 [facility] Patient Information 2013 [place], [place].

## 2012-05-22 NOTE — ED Notes (Signed)
Talked with Dr. Denton Lank about pt request to be discharged and follow up with resources.

## 2012-05-22 NOTE — ED Notes (Addendum)
Act team called in to ask that lab results be faxed to [facility].  done

## 2012-05-22 NOTE — ED Notes (Signed)
Nauseated med given

## 2012-05-22 NOTE — ED Notes (Signed)
i had to wake the pt to give him med.

## 2012-05-28 ENCOUNTER — Emergency Department (HOSPITAL_COMMUNITY): Payer: Self-pay

## 2012-05-28 ENCOUNTER — Emergency Department (HOSPITAL_COMMUNITY)
Admission: EM | Admit: 2012-05-28 | Discharge: 2012-05-28 | Disposition: A | Discharge status: Home / Self Care | Payer: Self-pay | Attending: Emergency Medicine | Admitting: Emergency Medicine

## 2012-05-28 ENCOUNTER — Encounter (HOSPITAL_COMMUNITY): Payer: Self-pay

## 2012-05-28 DIAGNOSIS — Z9089 Acquired absence of other organs: Secondary | ICD-10-CM | POA: Insufficient documentation

## 2012-05-28 DIAGNOSIS — F1010 Alcohol abuse, uncomplicated: Secondary | ICD-10-CM | POA: Insufficient documentation

## 2012-05-28 DIAGNOSIS — G8929 Other chronic pain: Secondary | ICD-10-CM | POA: Insufficient documentation

## 2012-05-28 DIAGNOSIS — M549 Dorsalgia, unspecified: Secondary | ICD-10-CM | POA: Insufficient documentation

## 2012-05-28 DIAGNOSIS — I10 Essential (primary) hypertension: Secondary | ICD-10-CM | POA: Insufficient documentation

## 2012-05-28 DIAGNOSIS — R0789 Other chest pain: Secondary | ICD-10-CM | POA: Insufficient documentation

## 2012-05-28 DIAGNOSIS — F17200 Nicotine dependence, unspecified, uncomplicated: Secondary | ICD-10-CM | POA: Insufficient documentation

## 2012-05-28 DIAGNOSIS — Z7982 Long term (current) use of aspirin: Secondary | ICD-10-CM | POA: Insufficient documentation

## 2012-05-28 DIAGNOSIS — Z8719 Personal history of other diseases of the digestive system: Secondary | ICD-10-CM | POA: Insufficient documentation

## 2012-05-28 LAB — RAPID URINE DRUG SCREEN, HOSP PERFORMED
Amphetamines: NOT DETECTED
Barbiturates: NOT DETECTED
Benzodiazepines: NOT DETECTED
Cocaine: NOT DETECTED

## 2012-05-28 LAB — CBC WITH DIFFERENTIAL/PLATELET
Basophils Absolute: 0.1 10*3/uL (ref 0.0–0.1)
Basophils Relative: 1 % (ref 0–1)
HCT: 43.4 % (ref 39.0–52.0)
MCHC: 35.7 g/dL (ref 30.0–36.0)
Monocytes Absolute: 0.8 10*3/uL (ref 0.1–1.0)
Neutro Abs: 4.1 10*3/uL (ref 1.7–7.7)
Neutrophils Relative %: 57 % (ref 43–77)
RDW: 15.3 % (ref 11.5–15.5)

## 2012-05-28 LAB — ETHANOL: Alcohol, Ethyl (B): 330 mg/dL — ABNORMAL HIGH (ref 0–11)

## 2012-05-28 LAB — TROPONIN I: Troponin I: <0.3 ng/mL (ref <0.30)

## 2012-05-28 LAB — COMPREHENSIVE METABOLIC PANEL
AST: 79 U/L — ABNORMAL HIGH (ref 0–37)
Albumin: 3.6 g/dL (ref 3.5–5.2)
Calcium: 8.6 mg/dL (ref 8.4–10.5)
Chloride: 101 mEq/L (ref 96–112)
Creatinine, Ser: 0.86 mg/dL (ref 0.50–1.35)
Total Bilirubin: 0.6 mg/dL (ref 0.3–1.2)

## 2012-05-28 MED ORDER — LORAZEPAM 1 MG PO TABS
1.0000 mg | ORAL_TABLET | Freq: Four times a day (QID) | ORAL | Status: DC | PRN
  Administered 2012-05-28: 1 mg via ORAL
  Filled 2012-05-28: qty 1

## 2012-05-28 MED ORDER — LORAZEPAM 2 MG/ML IJ SOLN
1.0000 mg | Freq: Once | INTRAMUSCULAR | Status: AC
  Administered 2012-05-28: 1 mg via INTRAVENOUS
  Filled 2012-05-28: qty 1

## 2012-05-28 MED ORDER — SODIUM CHLORIDE 0.9 % IV BOLUS (SEPSIS)
1000.0000 mL | Freq: Once | INTRAVENOUS | Status: AC
  Administered 2012-05-28: 1000 mL via INTRAVENOUS

## 2012-05-28 NOTE — ED Notes (Signed)
Pt in from home via [facility] with CP x12 hrs. Pt reports that he has consumed 1/2 gallon of liquor in past 24hrs. Pt was given 2 nitro with no relief and refused ASA en route. Pt also received 4 Zofran en route. Pt requesting detox from ETOH, states he has chest pain when he stops drinking ETOH, that is relieved when he drinks ETOH. Pt A&O and in NAD
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
## 2012-06-02 ENCOUNTER — Encounter (HOSPITAL_COMMUNITY): Payer: Self-pay | Admitting: Behavioral Health

## 2012-06-02 ENCOUNTER — Inpatient Hospital Stay (HOSPITAL_COMMUNITY)
Admission: AD | Admit: 2012-06-02 | Discharge: 2012-06-15 | DRG: 897 | Disposition: A | Discharge status: Home / Self Care | Payer: Federal, State, Local not specified - Other | Source: Ambulatory Visit | Attending: Psychiatry | Admitting: Psychiatry

## 2012-06-02 ENCOUNTER — Emergency Department (HOSPITAL_COMMUNITY)
Admission: EM | Admit: 2012-06-02 | Discharge: 2012-06-02 | Disposition: A | Discharge status: Other Acute Inpatient Hospital | Payer: Self-pay | Attending: Emergency Medicine | Admitting: Emergency Medicine

## 2012-06-02 ENCOUNTER — Encounter (HOSPITAL_COMMUNITY): Payer: Self-pay | Admitting: Family Medicine

## 2012-06-02 DIAGNOSIS — I10 Essential (primary) hypertension: Secondary | ICD-10-CM | POA: Diagnosis present

## 2012-06-02 DIAGNOSIS — F10229 Alcohol dependence with intoxication, unspecified: Secondary | ICD-10-CM

## 2012-06-02 DIAGNOSIS — F329 Major depressive disorder, single episode, unspecified: Secondary | ICD-10-CM | POA: Diagnosis present

## 2012-06-02 DIAGNOSIS — F1020 Alcohol dependence, uncomplicated: Secondary | ICD-10-CM

## 2012-06-02 DIAGNOSIS — F3289 Other specified depressive episodes: Secondary | ICD-10-CM | POA: Diagnosis present

## 2012-06-02 DIAGNOSIS — F1410 Cocaine abuse, uncomplicated: Secondary | ICD-10-CM

## 2012-06-02 DIAGNOSIS — R110 Nausea: Secondary | ICD-10-CM | POA: Insufficient documentation

## 2012-06-02 DIAGNOSIS — F17200 Nicotine dependence, unspecified, uncomplicated: Secondary | ICD-10-CM | POA: Insufficient documentation

## 2012-06-02 DIAGNOSIS — F1420 Cocaine dependence, uncomplicated: Principal | ICD-10-CM | POA: Diagnosis present

## 2012-06-02 DIAGNOSIS — F1910 Other psychoactive substance abuse, uncomplicated: Secondary | ICD-10-CM

## 2012-06-02 DIAGNOSIS — F1120 Opioid dependence, uncomplicated: Secondary | ICD-10-CM | POA: Diagnosis present

## 2012-06-02 DIAGNOSIS — G8929 Other chronic pain: Secondary | ICD-10-CM | POA: Insufficient documentation

## 2012-06-02 DIAGNOSIS — R51 Headache: Secondary | ICD-10-CM | POA: Insufficient documentation

## 2012-06-02 DIAGNOSIS — F419 Anxiety disorder, unspecified: Secondary | ICD-10-CM | POA: Diagnosis present

## 2012-06-02 DIAGNOSIS — F1010 Alcohol abuse, uncomplicated: Secondary | ICD-10-CM

## 2012-06-02 DIAGNOSIS — F10239 Alcohol dependence with withdrawal, unspecified: Secondary | ICD-10-CM | POA: Diagnosis present

## 2012-06-02 DIAGNOSIS — F10929 Alcohol use, unspecified with intoxication, unspecified: Secondary | ICD-10-CM

## 2012-06-02 DIAGNOSIS — Z79899 Other long term (current) drug therapy: Secondary | ICD-10-CM

## 2012-06-02 DIAGNOSIS — N390 Urinary tract infection, site not specified: Secondary | ICD-10-CM | POA: Insufficient documentation

## 2012-06-02 DIAGNOSIS — F1920 Other psychoactive substance dependence, uncomplicated: Secondary | ICD-10-CM

## 2012-06-02 DIAGNOSIS — F411 Generalized anxiety disorder: Secondary | ICD-10-CM | POA: Diagnosis present

## 2012-06-02 DIAGNOSIS — Z8719 Personal history of other diseases of the digestive system: Secondary | ICD-10-CM | POA: Insufficient documentation

## 2012-06-02 DIAGNOSIS — M549 Dorsalgia, unspecified: Secondary | ICD-10-CM | POA: Insufficient documentation

## 2012-06-02 LAB — ETHANOL
Alcohol, Ethyl (B): 346 mg/dL — ABNORMAL HIGH (ref 0–11)
Alcohol, Ethyl (B): 161 mg/dL — ABNORMAL HIGH (ref 0–11)

## 2012-06-02 LAB — URINALYSIS, ROUTINE W REFLEX MICROSCOPIC
Glucose, UA: NEGATIVE mg/dL
Hgb urine dipstick: NEGATIVE
Nitrite: NEGATIVE
Specific Gravity, Urine: 1.023 (ref 1.005–1.030)
pH: 5.5 (ref 5.0–8.0)

## 2012-06-02 LAB — GLUCOSE, CAPILLARY: Glucose-Capillary: 218 mg/dL — ABNORMAL HIGH (ref 70–99)

## 2012-06-02 LAB — COMPREHENSIVE METABOLIC PANEL
Albumin: 3.5 g/dL (ref 3.5–5.2)
BUN: 4 mg/dL — ABNORMAL LOW (ref 6–23)
Calcium: 8.6 mg/dL (ref 8.4–10.5)
GFR calc Af Amer: >90 mL/min (ref >90)
Glucose, Bld: 128 mg/dL — ABNORMAL HIGH (ref 70–99)
Total Protein: 7.3 g/dL (ref 6.0–8.3)

## 2012-06-02 LAB — SALICYLATE LEVEL: Salicylate Lvl: <2 mg/dL — ABNORMAL LOW (ref 2.8–20.0)

## 2012-06-02 LAB — RAPID URINE DRUG SCREEN, HOSP PERFORMED
Barbiturates: NOT DETECTED
Cocaine: POSITIVE — AB
Opiates: NOT DETECTED

## 2012-06-02 LAB — URINE MICROSCOPIC-ADD ON

## 2012-06-02 LAB — CBC
HCT: 46 % (ref 39.0–52.0)
Hemoglobin: 16.5 g/dL (ref 13.0–17.0)
MCH: 33.4 pg (ref 26.0–34.0)
MCHC: 35.9 g/dL (ref 30.0–36.0)
RDW: 15.1 % (ref 11.5–15.5)

## 2012-06-02 MED ORDER — SODIUM CHLORIDE 0.9 % IV SOLN
1000.0000 mL | INTRAVENOUS | Status: DC
  Administered 2012-06-02: 1000 mL via INTRAVENOUS

## 2012-06-02 MED ORDER — THIAMINE HCL 100 MG/ML IJ SOLN
100.0000 mg | Freq: Once | INTRAMUSCULAR | Status: AC
  Administered 2012-06-02: 100 mg via INTRAMUSCULAR

## 2012-06-02 MED ORDER — ALUM & MAG HYDROXIDE-SIMETH 200-200-20 MG/5ML PO SUSP: 30.0000 mL | ORAL | Status: DC | PRN

## 2012-06-02 MED ORDER — LOPERAMIDE HCL 2 MG PO CAPS
2.0000 mg | ORAL_CAPSULE | ORAL | Status: AC | PRN
  Administered 2012-06-02 – 2012-06-03 (×2): 4 mg via ORAL

## 2012-06-02 MED ORDER — NITROFURANTOIN MONOHYD MACRO 100 MG PO CAPS
100.0000 mg | ORAL_CAPSULE | Freq: Two times a day (BID) | ORAL | Status: AC
  Administered 2012-06-02 – 2012-06-09 (×13): 100 mg via ORAL
  Filled 2012-06-02 (×16): qty 1

## 2012-06-02 MED ORDER — FOLIC ACID 1 MG PO TABS
1.0000 mg | ORAL_TABLET | Freq: Every day | ORAL | Status: DC
  Administered 2012-06-02: 1 mg via ORAL
  Filled 2012-06-02: qty 1

## 2012-06-02 MED ORDER — LORAZEPAM 2 MG/ML IJ SOLN
1.0000 mg | Freq: Once | INTRAMUSCULAR | Status: AC
  Administered 2012-06-02: 1 mg via INTRAVENOUS

## 2012-06-02 MED ORDER — POTASSIUM CHLORIDE CRYS ER 20 MEQ PO TBCR
40.0000 meq | EXTENDED_RELEASE_TABLET | Freq: Once | ORAL | Status: AC
  Administered 2012-06-02: 40 meq via ORAL
  Filled 2012-06-02: qty 2

## 2012-06-02 MED ORDER — ACETAMINOPHEN 325 MG PO TABS
650.0000 mg | ORAL_TABLET | Freq: Four times a day (QID) | ORAL | Status: DC | PRN
  Administered 2012-06-04: 650 mg via ORAL

## 2012-06-02 MED ORDER — CHLORDIAZEPOXIDE HCL 25 MG PO CAPS
25.0000 mg | ORAL_CAPSULE | Freq: Every day | ORAL | Status: AC
  Administered 2012-06-06: 25 mg via ORAL
  Filled 2012-06-02: qty 1

## 2012-06-02 MED ORDER — ADULT MULTIVITAMIN W/MINERALS CH
1.0000 | ORAL_TABLET | Freq: Every day | ORAL | Status: DC
  Administered 2012-06-02: 1 via ORAL
  Filled 2012-06-02: qty 1

## 2012-06-02 MED ORDER — NITROFURANTOIN MONOHYD MACRO 100 MG PO CAPS
100.0000 mg | ORAL_CAPSULE | Freq: Two times a day (BID) | ORAL | Status: DC
  Administered 2012-06-02: 100 mg via ORAL
  Filled 2012-06-02 (×3): qty 1

## 2012-06-02 MED ORDER — ALUM & MAG HYDROXIDE-SIMETH 200-200-20 MG/5ML PO SUSP
30.0000 mL | ORAL | Status: DC | PRN
  Administered 2012-06-06 – 2012-06-14 (×6): 30 mL via ORAL

## 2012-06-02 MED ORDER — LORAZEPAM 1 MG PO TABS
0.0000 mg | ORAL_TABLET | Freq: Four times a day (QID) | ORAL | Status: DC
  Administered 2012-06-02: 1 mg via ORAL
  Administered 2012-06-02: 2 mg via ORAL
  Filled 2012-06-02: qty 1
  Filled 2012-06-02: qty 2

## 2012-06-02 MED ORDER — ONDANSETRON 4 MG PO TBDP
4.0000 mg | ORAL_TABLET | Freq: Once | ORAL | Status: AC
  Administered 2012-06-02: 4 mg via ORAL
  Filled 2012-06-02: qty 1

## 2012-06-02 MED ORDER — NICOTINE 21 MG/24HR TD PT24
21.0000 mg | MEDICATED_PATCH | Freq: Every day | TRANSDERMAL | Status: DC
  Administered 2012-06-03 – 2012-06-14 (×12): 21 mg via TRANSDERMAL
  Filled 2012-06-02 (×16): qty 1

## 2012-06-02 MED ORDER — LORAZEPAM 1 MG PO TABS: 0.0000 mg | ORAL_TABLET | Freq: Two times a day (BID) | ORAL | Status: DC

## 2012-06-02 MED ORDER — IBUPROFEN 200 MG PO TABS: 600.0000 mg | ORAL_TABLET | Freq: Three times a day (TID) | ORAL | Status: DC | PRN

## 2012-06-02 MED ORDER — KETOROLAC TROMETHAMINE 30 MG/ML IJ SOLN
30.0000 mg | Freq: Once | INTRAMUSCULAR | Status: AC
  Administered 2012-06-02: 30 mg via INTRAVENOUS
  Filled 2012-06-02: qty 1

## 2012-06-02 MED ORDER — SODIUM CHLORIDE 0.9 % IV BOLUS (SEPSIS): 1000.0000 mL | Freq: Once | INTRAVENOUS | Status: DC

## 2012-06-02 MED ORDER — LORAZEPAM 1 MG PO TABS
1.0000 mg | ORAL_TABLET | Freq: Four times a day (QID) | ORAL | Status: DC | PRN
  Administered 2012-06-02 (×2): 1 mg via ORAL
  Filled 2012-06-02 (×2): qty 1

## 2012-06-02 MED ORDER — ONDANSETRON HCL 4 MG PO TABS
4.0000 mg | ORAL_TABLET | Freq: Three times a day (TID) | ORAL | Status: DC | PRN
  Administered 2012-06-02: 4 mg via ORAL
  Filled 2012-06-02: qty 1

## 2012-06-02 MED ORDER — THIAMINE HCL 100 MG/ML IJ SOLN
100.0000 mg | Freq: Every day | INTRAMUSCULAR | Status: DC
  Administered 2012-06-02: 100 mg via INTRAVENOUS
  Filled 2012-06-02: qty 2

## 2012-06-02 MED ORDER — VITAMIN B-1 100 MG PO TABS
100.0000 mg | ORAL_TABLET | Freq: Every day | ORAL | Status: DC
  Administered 2012-06-03 – 2012-06-14 (×12): 100 mg via ORAL
  Filled 2012-06-02 (×14): qty 1

## 2012-06-02 MED ORDER — VITAMIN B-1 100 MG PO TABS: 100.0000 mg | ORAL_TABLET | Freq: Every day | ORAL | Status: DC

## 2012-06-02 MED ORDER — ONDANSETRON 4 MG PO TBDP
4.0000 mg | ORAL_TABLET | Freq: Four times a day (QID) | ORAL | Status: AC | PRN
  Administered 2012-06-02 – 2012-06-03 (×3): 4 mg via ORAL
  Filled 2012-06-02 (×2): qty 1

## 2012-06-02 MED ORDER — CHLORDIAZEPOXIDE HCL 25 MG PO CAPS
25.0000 mg | ORAL_CAPSULE | Freq: Three times a day (TID) | ORAL | Status: AC
  Administered 2012-06-03 – 2012-06-04 (×3): 25 mg via ORAL
  Filled 2012-06-02 (×3): qty 1

## 2012-06-02 MED ORDER — CHLORDIAZEPOXIDE HCL 25 MG PO CAPS
25.0000 mg | ORAL_CAPSULE | Freq: Four times a day (QID) | ORAL | Status: AC
  Administered 2012-06-02 – 2012-06-03 (×3): 25 mg via ORAL
  Filled 2012-06-02 (×3): qty 1

## 2012-06-02 MED ORDER — NICOTINE 21 MG/24HR TD PT24
21.0000 mg | MEDICATED_PATCH | Freq: Every day | TRANSDERMAL | Status: DC
  Administered 2012-06-02 (×2): 21 mg via TRANSDERMAL
  Filled 2012-06-02 (×2): qty 1

## 2012-06-02 MED ORDER — POTASSIUM CHLORIDE 10 MEQ/100ML IV SOLN
10.0000 meq | INTRAVENOUS | Status: AC
  Administered 2012-06-02 (×2): 10 meq via INTRAVENOUS
  Filled 2012-06-02 (×4): qty 100

## 2012-06-02 MED ORDER — PANTOPRAZOLE SODIUM 40 MG PO TBEC
40.0000 mg | DELAYED_RELEASE_TABLET | Freq: Every day | ORAL | Status: DC
  Administered 2012-06-02: 40 mg via ORAL
  Filled 2012-06-02: qty 1

## 2012-06-02 MED ORDER — HYDROXYZINE HCL 25 MG PO TABS
25.0000 mg | ORAL_TABLET | Freq: Four times a day (QID) | ORAL | Status: AC | PRN
  Administered 2012-06-02 – 2012-06-05 (×9): 25 mg via ORAL
  Filled 2012-06-02: qty 1

## 2012-06-02 MED ORDER — LORAZEPAM 2 MG/ML IJ SOLN
1.0000 mg | Freq: Four times a day (QID) | INTRAMUSCULAR | Status: DC | PRN
  Filled 2012-06-02: qty 1

## 2012-06-02 MED ORDER — SODIUM CHLORIDE 0.9 % IV SOLN
1000.0000 mL | Freq: Once | INTRAVENOUS | Status: AC
  Administered 2012-06-02: 1000 mL via INTRAVENOUS

## 2012-06-02 MED ORDER — ONDANSETRON HCL 4 MG/2ML IJ SOLN
4.0000 mg | Freq: Once | INTRAMUSCULAR | Status: AC
  Administered 2012-06-02: 4 mg via INTRAVENOUS
  Filled 2012-06-02: qty 2

## 2012-06-02 MED ORDER — CHLORDIAZEPOXIDE HCL 25 MG PO CAPS
25.0000 mg | ORAL_CAPSULE | Freq: Four times a day (QID) | ORAL | Status: AC | PRN
  Administered 2012-06-02 – 2012-06-05 (×8): 25 mg via ORAL
  Filled 2012-06-02 (×8): qty 1

## 2012-06-02 MED ORDER — NAPROXEN 500 MG PO TABS
500.0000 mg | ORAL_TABLET | Freq: Two times a day (BID) | ORAL | Status: DC | PRN
  Administered 2012-06-02 – 2012-06-14 (×16): 500 mg via ORAL
  Filled 2012-06-02 (×17): qty 1

## 2012-06-02 MED ORDER — ADULT MULTIVITAMIN W/MINERALS CH
1.0000 | ORAL_TABLET | Freq: Every day | ORAL | Status: DC
  Administered 2012-06-02 – 2012-06-14 (×13): 1 via ORAL
  Filled 2012-06-02 (×2): qty 1
  Filled 2012-06-02: qty 14
  Filled 2012-06-02 (×12): qty 1

## 2012-06-02 MED ORDER — CHLORDIAZEPOXIDE HCL 25 MG PO CAPS
25.0000 mg | ORAL_CAPSULE | ORAL | Status: AC
  Administered 2012-06-04 – 2012-06-05 (×2): 25 mg via ORAL
  Filled 2012-06-02 (×2): qty 1

## 2012-06-02 MED ORDER — CHLORDIAZEPOXIDE HCL 25 MG PO CAPS
25.0000 mg | ORAL_CAPSULE | Freq: Once | ORAL | Status: AC
  Administered 2012-06-02: 25 mg via ORAL
  Filled 2012-06-02: qty 1

## 2012-06-02 MED ORDER — MAGNESIUM HYDROXIDE 400 MG/5ML PO SUSP: 30.0000 mL | Freq: Every day | ORAL | Status: DC | PRN
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
## 2012-06-02 NOTE — ED Notes (Signed)
[id number]<AV> Expected date:<BR> Expected time:<BR> Means of arrival:<BR> Comments:<BR> From psyche-needs [facility]

## 2012-06-02 NOTE — ED Notes (Signed)
Pt has 2 bags of belongings.  Pt was seen and searched by security.    

## 2012-06-02 NOTE — ED Notes (Signed)
Dr Elsie Saas in w/ pt, dr Lynelle Doctor aware of ortho static VS

## 2012-06-02 NOTE — ED Notes (Addendum)
Patient states he is here for alcoholism. States he drinks @ 1/2 gallon per day, liquor. Patient states that he smokes cigarettes and also uses cocaine. Patient reports having previously gone through detox in April 2013. Last drink reported 06/01/12 @1700 . Last cocaine usage 05/31/12. Last cigarette 06/01/12 @1700 .

## 2012-06-02 NOTE — ED Notes (Signed)
Dr Read Drivers aware of pt elevated heart rate and elevated etoh level; states patient may go to psych ED

## 2012-06-02 NOTE — ED Notes (Signed)
Dr Jonnalagadda in w/pt. 

## 2012-06-02 NOTE — BHH Counselor (Signed)
Patient Brent Gay) accepted to [facility] 06/02/2012. Patient accepted by Alvy Beal, NP and the attending psychiatrist is Dr. Geoffery Lyons. Room Assignment at [facility] is 304-1. Patient's Axis I diagnosis is Alcohol Dependence and Cocaine Dependence. Notified EDP-Iva Knapp or patients disposition and she agreed with plan of treatment. Also notified patients nurse and provided call report [phone]. Patient is here voluntarily and will be transported via security to [facility].

## 2012-06-02 NOTE — ED Notes (Signed)
Pt vomiting in  Room , is aware that he is going to be moved to [facility] for further treatment

## 2012-06-02 NOTE — ED Notes (Signed)
Pt up to the bathroom and desk, pt reports he is "dehydrated...needs fluids...a banana bag..." and is requesting more ativan.  Pt also reports that he has reflux and is requesting a "GI cocktail."

## 2012-06-02 NOTE — ED Notes (Signed)
Dr Lynelle Doctor informed of initial IV fluids hung-VORB to continue and complete 2l bolus as ordered and repeat CBG when finished.

## 2012-06-02 NOTE — ED Provider Notes (Signed)
Patient has gotten 2 L IV fluid, 20 mEq of potassium IV and 40 mEq by mouth.  [place], ACT states accepted at [facility] by Dr Dub Mikes.   Devoria Albe, MD, Armando Gang   Ward Givens, MD 06/02/12 [phone]
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
## 2012-06-02 NOTE — ED Notes (Signed)
Nausea improved some, but not resolved, pt reports HA, shakes (observed), hot/cold, anxiety.  Pt medicated.

## 2012-06-02 NOTE — ED Notes (Signed)
Pt transferred from main ed, presents for detox from alcohol and cocaine.  Pt reports he drinks 1/2 gal of liquor per day.  Admits to cocaine abuse also.  Denies SI or HI, no AV hallucinations.  Pt calm & cooperative at present.

## 2012-06-02 NOTE — ED Notes (Signed)
Dr. Jonnalagadda into see 

## 2012-06-02 NOTE — ED Notes (Signed)
IV lt wrist x1 attempt-blood drawn but IV would not flow.  Iv dc's, cath intact site WNL.

## 2012-06-02 NOTE — ED Notes (Signed)
Dr knapp into see 

## 2012-06-02 NOTE — ED Provider Notes (Signed)
Psych nurse reports patient has baseline tachycardia and he comes in a more tachycardic on standing. He is been nauseated and not drinking fluids well. Will order IV fluid bolus for dehydration.  Ward Givens, MD 06/02/12 1025

## 2012-06-02 NOTE — ED Notes (Addendum)
IV infusing via gravity, pt tolerating well,  IV NS hung , initial IV d5.45NS approx 400 ml infused

## 2012-06-02 NOTE — Tx Team (Signed)
Initial Interdisciplinary Treatment Plan  PATIENT STRENGTHS: (choose at least two) Ability for insight Capable of independent living General fund of knowledge Motivation for treatment/growth  PATIENT STRESSORS: Educational concerns Financial difficulties Substance abuse   PROBLEM LIST: Problem List/Patient Goals Date to be addressed Date deferred Reason deferred Estimated date of resolution  Substance Abuse      Financial Difficulties                                                 DISCHARGE CRITERIA:  Ability to meet basic life and health needs Improved stabilization in mood, thinking, and/or behavior Motivation to continue treatment in a less acute level of care  PRELIMINARY DISCHARGE PLAN: Attend aftercare/continuing care group Attend 12-step recovery group Outpatient therapy  PATIENT/FAMIILY INVOLVEMENT: This treatment plan has been presented to and reviewed with the patient, DILLIN LOFGREN.  The patient and family have been given the opportunity to ask questions and make suggestions.  Melanee Spry 06/02/2012, 6:21 PM

## 2012-06-02 NOTE — ED Notes (Signed)
Pt transferred to room 6 for further eval.  Report to [facility] on arrival.  Pt ate ice pop in route w/o difficulty. Reports pain has improved now 8/10.

## 2012-06-02 NOTE — ED Notes (Signed)
Patient states he is here for alcoholism. States he last drank 12 hours ago and that he needs help from alcohol.

## 2012-06-02 NOTE — ED Notes (Signed)
Up to the bathroom 

## 2012-06-02 NOTE — ED Notes (Signed)
[id number]> Expected date:<BR> Expected time:<BR> Means of arrival:<BR> Comments:<BR> Hold for TR4

## 2012-06-02 NOTE — Progress Notes (Signed)
Admission Note  D: Patient appropriate and cooperative with staff. Patient is 45 y/o, voluntary, requesting detox. Patient verbalized that he's been drinking a 1/2 gallon of liquor day for the past month. He stated that he's "homeless and was staying at a motel, but ran out of money."  A: Support and encouragement provided to patient. Oriented patient to the unit and informed patient of the rules on the unit. Initiated Q15 minute checks for safety.  R: Patient receptive. Denies SI/HI/AVH. Patient remains safe on the unit.

## 2012-06-02 NOTE — ED Provider Notes (Signed)
[place], ACT states [facility] wants his potassium to be 3.5, it is currently 3.4 Will add potassium to IV and oral. PT also c/o body aches and was given IV toradol. PT requesting morphine.   Ward Givens, MD 06/02/12 1226

## 2012-06-02 NOTE — ED Notes (Signed)
ACT into see 

## 2012-06-02 NOTE — ED Notes (Signed)
2 bags in locker 36 

## 2012-06-02 NOTE — ED Provider Notes (Signed)
Pt sleeping, when awakened states he "feels terrible". States he is having anxiety and feels shakey. He then states "Oh, I'm having angina". States he is having chest pain, was evaluated for same last ED visit. He doesn't have a cardiologist and wasn't given a diagnosis of angina. Pt requesting more ativan. Will review his med list.   Ward Givens, MD 06/02/12 [phone]

## 2012-06-02 NOTE — ED Notes (Signed)
Assisted primary nurse Janie with scanning zofran med as ordered.  Scanner is showing that an 8mg  vial is being scanned.  Vial on hand was a 4mg  vial verified by [employer].  This nurse changed dose to 4mg .

## 2012-06-03 DIAGNOSIS — F1410 Cocaine abuse, uncomplicated: Secondary | ICD-10-CM | POA: Diagnosis present

## 2012-06-03 DIAGNOSIS — F1010 Alcohol abuse, uncomplicated: Secondary | ICD-10-CM

## 2012-06-03 LAB — URINE CULTURE

## 2012-06-03 NOTE — BHH Suicide Risk Assessment (Signed)
Suicide Risk Assessment  Admission Assessment     Nursing information obtained from:  Patient Demographic factors:  Male Current Mental Status:  Patient has chronic pain and symptoms of alcohol withdrawal. He has decreased psychomotor activity and has passive suicidal ideations but no homicidal ideations. He has no evidence of psychosis. Loss Factors:  Financial problems / change in socioeconomic status Historical Factors:  NA Risk Reduction Factors:  Employed;NA  CLINICAL FACTORS:   Severe Anxiety and/or Agitation Depression:   Anhedonia Comorbid alcohol abuse/dependence Hopelessness Insomnia Recent sense of peace/wellbeing Severe Alcohol/Substance Abuse/Dependencies Personality Disorders:   Comorbid alcohol abuse/dependence Chronic Pain Unstable or Poor Therapeutic Relationship  COGNITIVE FEATURES THAT CONTRIBUTE TO RISK:  Closed-mindedness Loss of executive function Polarized thinking    SUICIDE RISK:   Minimal: No identifiable suicidal ideation.  Patients presenting with no risk factors but with morbid ruminations; may be classified as minimal risk based on the severity of the depressive symptoms  PLAN OF CARE: Patient was admitted from [facility] for alcohol and cocaine intoxication and detox treatment with Librium protocol.    Gotham Raden,JANARDHAHA R. 06/03/2012, 1:44 PM

## 2012-06-03 NOTE — Clinical Social Work Note (Signed)
BHH Group Notes:  (Clinical Social Work)  06/03/2012  10:00-11:00AM  Summary of Progress/Problems:   The main focus of today's process group was for the patient to identify ways in which they have in the past sabotaged their own recovery and reasons they may have done this/what they received from doing it.  We then worked to identify a specific plan to avoid doing this when discharged from the hospital for this admission.  The patient expressed that he self-sabotages by telling himself he can handle the drinking, and that it will make his pain go away.  He practiced cognitive restructuring of these thoughts in group.  Type of Therapy:  Group Therapy - Process  Participation Level:  Active  Participation Quality:  Appropriate, Attentive and Sharing  Affect:  Depressed  Cognitive:  Appropriate and Oriented  Insight:  Engaged  Engagement in Therapy:  Engaged  Modes of Intervention:  Clarification, Education, Limit-setting, Problem-solving, Socialization, Support and Processing, Exploration, Discussion   Ambrose Mantle, LCSW 06/03/2012, 12:45 PM

## 2012-06-03 NOTE — BHH Counselor (Signed)
Adult Comprehensive Assessment  Patient ID: Brent Gay, male   DOB: 08-20-1965, 46 y.o.   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  Experimenting with cocaine doesn't help much either." Bereavement / Loss: "My brother-in-law just died, had been sick for a long time, has hurt me and whole family."  Living/Environment/Situation:  Living Arrangements: Other (Comment) (Homeless as of 12/22, has been staying in a hotel, paid up through 12/20) Living conditions (as described by patient or guardian): Temporary How long has patient lived in current situation?: 2 months, but prior to that was staying with mother, moved back from [place] in March-April 2013 What is atmosphere in current home: Temporary  Family History:  Marital status: Single Does patient have children?: No  Childhood History:  By whom was/is the patient raised?: Both parents Description of patient's relationship with caregiver when they were a child: "Fine" Patient's description of current relationship with people who raised him/her: "fine still" Does patient have siblings?:  Yes Number of Siblings: 5  (4 sisters and 1 brother) Description of patient's current relationship with siblings: "Fine, we don't talk as much as we used to" Did patient suffer any verbal/emotional/physical/sexual abuse as a child?: No Did patient suffer from severe childhood neglect?: No Has patient ever been sexually abused/assaulted/raped as an adolescent or adult?: No Was the patient ever a victim of a crime or a disaster?: No Witnessed domestic violence?: No Has patient been effected by domestic violence as an adult?: No  Education:  Highest grade of school patient has completed: 2 years of college Currently a student?: No 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, 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 due to alcohol use Learning disability?: No  Employment/Work Situation:   Employment situation: Unemployed Patient's job has been impacted by current illness: Yes Describe how patient's job has been implacted: Drinking, too sick to go to work What is the longest time patient has a held a job?: 20 years Where was the patient employed at that time?: Holiday representative Has patient ever been in the [employer]?: Yes (Describe in comment) [employer] - 8 years) Has patient ever served in Buyer, retail?: No  Financial Resources:   Surveyor, quantity resources: No income Does patient have a Lawyer or guardian?: No  Alcohol/Substance Abuse:   What has been your use of drugs/alcohol within the last 12 months?: Alcohol 1/2 gallon daily, Cocaine 1 gram every 3 days (more recreational) - last month If attempted suicide, did drugs/alcohol play a role in this?: No Alcohol/Substance Abuse Treatment Hx: Past Tx, Inpatient If yes, describe treatment: [facility] April 2013 Has alcohol/substance abuse ever caused legal problems?: Yes (Simple intoxication, open containers)  Social  Support System:   Patient's Community Support System: Production assistant, radio System: "Good when I use it" -  includes halfway house, sponsors, AA, stuff like that." Type of faith/religion: Catholic How does patient's faith help to cope with current illness?: Does not use  Leisure/Recreation:   Leisure and Hobbies: Fish, play pool  Strengths/Needs:   What things does the patient do well?: Carpentry, fishing, 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, Merchandiser, retail In what areas does patient struggle / problems for patient: "Complacency with alcoholism, get sober for awhile then get jealous of people drink, think I can do it again."  Discharge Plan:   Does patient have access to transportation?: No Plan for no access to transportation at discharge: Has a truck at [employer], but no gas in it right now." Will patient be returning to same living situation after discharge?: No Plan for living situation after discharge: Wants structured housing, like [employer] Currently receiving community mental health services: No If no, would patient like referral for services when discharged?: Yes (What county?) Medical sales representative) Does patient have financial barriers related to discharge medications?: Yes Patient description of barriers related to discharge medications: Has no income at this time  Summary/Recommendations:   Summary and Recommendations (to be completed by the evaluator): This is a 46yo Caucasian male who is here for detoxification and placement into rehabilitation program.  Would like to go to [facility] or something similar, then go into a halfway houe such as [place].  Does not believe he can be sober with just detox, but needs continued treatment.  He is in mourning for his brother-in-law who passed away, and has fair to good relationships with his parents and 5 siblings, depending on whether he is dirnking.  He has worked in Psychiatrist for a long time, but recently moved to [place] to attend [employer] on scholarship and school loans.  He has been staying in a hotel but otherwise is  homeless, and has no income.  He has previously been in alcohol abuse treatment, and states that he starts to think he can drink a little, gets jealous of others drinking.  He has recently been experimenting with powder cocaine.  This patient would benefit from safety monitoring, medication evaluation, psychoeducation, group therapy, and discharge planning to link with ongoing resources.   Sarina Ser. 06/03/2012

## 2012-06-03 NOTE — Progress Notes (Signed)
Patient did not attend the evening speaker AA meeting. Pt notified of meeting but remained in bed complaining of withdrawal symptoms.

## 2012-06-03 NOTE — H&P (Signed)
Psychiatric Admission Assessment Adult  Patient Identification:  Brent Gay  Date of Evaluation:  06/03/2012  Chief Complaint:  etoh and cocaine dependence  History of Present Illness: This is an admission assessment for this 46 year old Caucasian male. Admitted to [facility] from the [facility] ED with complaints of cocaine and alcohol abuse requesting detoxification treatment. Patient reports, "I drove myself to the hospital because I was in a lot of pain from alcohol and drug withdrawal symptoms. I was also vomiting a lot. I have been drinking alcohol and using drugs excessively x 1 month. However, I have been abusing drugs off and on x 22 years. I use drugs and alcohol because I feel bored a lot of times. Drugs, alcohol allow me temporary relief from boredom. They lighten me up and I will feel lively again. Drugs has caused me jobs, relationships, legal charges and inability to finish college as well. I have been through drug rehab this year 2013,. I have been to [facility] and [facility] in the past and in this hospital x 2 . I feel very depressed, and will consider taking  An antidepressant while I'm in here".  Elements:  Location:  [facility] adult unit. Quality:  Increased pain, cravings, tremors, . Severity:  "I feel pretty bad". Timing:  "I have been using and drinking heavily x 1 month".. Duration:  "I have been using off and on x 22 years". Context:  "Using drugs and alcoholism has cause me jobs, family alienation and homelessness"..  Associated Signs/Synptoms:  Depression Symptoms:  depressed mood, anhedonia, insomnia, feelings of worthlessness/guilt, difficulty concentrating, hopelessness, anxiety, loss of energy/fatigue,  (Hypo) Manic Symptoms:  Impulsivity, Irritable Mood,  Anxiety Symptoms:  Excessive Worry,  Psychotic Symptoms:  Denies hallucinations.  PTSD Symptoms: None reported  Psychiatric Specialty Exam: Physical Exam  Constitutional: He is oriented to person, place,  and time. He appears well-nourished.  HENT:  Head: Normocephalic.  Eyes: Pupils are equal, round, and reactive to light.  Neck: Normal range of motion.  Cardiovascular: Normal rate.   Respiratory: Effort normal.  GI: Soft.  Musculoskeletal: Normal range of motion.  Neurological: He is alert and oriented to person, place, and time.  Skin: Skin is warm and dry.  Psychiatric: His speech is normal and behavior is normal. Judgment and thought content normal. His mood appears anxious. Cognition and memory are normal. He exhibits a depressed mood.    Review of Systems  Constitutional: Negative.   HENT: Negative.   Eyes: Negative.   Respiratory: Negative.   Cardiovascular: Negative.   Gastrointestinal: Positive for nausea, vomiting and abdominal pain.  Genitourinary: Negative.   Musculoskeletal: Negative.   Skin: Negative.   Neurological: Negative.   Endo/Heme/Allergies: Negative.   Psychiatric/Behavioral: Positive for depression and substance abuse. Negative for suicidal ideas, hallucinations and memory loss. The patient is nervous/anxious and has insomnia.     Blood pressure 134/94, pulse 121, temperature 98.2 F (36.8 C), temperature source Oral, resp. rate 18, height 5' 7.5" (1.715 m), weight 94.802 kg (209 lb).Body mass index is 32.25 kg/(m^2).  General Appearance: Casual  Eye Contact::  Good  Speech:  Clear and Coherent  Volume:  Normal  Mood:  Depressed  Affect:  Flat  Thought Process:  Coherent and Intact  Orientation:  Full (Time, Place, and Person)  Thought Content:  Rumination  Suicidal Thoughts:  No  Homicidal Thoughts:  No  Memory:  Immediate;   Good Recent;   Good Remote;   Good  Judgement:  Impaired  Insight:  Fair  Psychomotor Activity:  Normal  Concentration:  Fair  Recall:  Good  Akathisia:  No  Handed:  Right  AIMS (if indicated):     Assets:  Desire for Improvement  Sleep:  Number of Hours: 4.75     Past Psychiatric History: Diagnosis: Alcohol  abuse, Cocaine abuse  Hospitalizations: [facility]  Outpatient Care: None reported  Substance Abuse Care: [facility] 2013, [facility]  Self-Mutilation: None reported  Suicidal Attempts: Denies attempt and or thoughts.  Violent Behaviors: None reported   Past Medical History:   Past Medical History  Diagnosis Date  . Hypertension   . Chronic back pain   . Alcohol abuse   . GERD (gastroesophageal reflux disease)   . Gastritis   . Atypical chest pain   . Chronic pain   . Alcoholic liver disease    Allergies:  No Known Allergies  PTA Medications: Prescriptions prior to admission  Medication Sig Dispense Refill  . Multiple Vitamin (MULTIVITAMIN WITH MINERALS) TABS Take 1 tablet by mouth daily.        Previous Psychotropic Medications:  Medication/Dose  Multivitamins daily               Substance Abuse History in the last 12 months:  yes  Consequences of Substance Abuse: Medical Consequences:  Liver damage, Possible death by overdose Legal Consequences:  Arrests, jail time, Loss of driving privilege. Family Consequences:  Family discord, divorce and or separation.   Social History:  reports that he quit smoking yesterday. His smoking use included Cigarettes. He has a .5 pack-year smoking history. He has quit using smokeless tobacco. He reports that he drinks alcohol. He reports that he uses illicit drugs (Cocaine). Additional Social History: Pain Medications: None Prescriptions: None History of alcohol / drug use?: Yes Longest period of sobriety (when/how long): 8 months Negative Consequences of Use: Financial;Work / School;Personal relationships Withdrawal Symptoms: Tremors Name of Substance 1: Alcohol 1 - Age of First Use: 15 1 - Amount (size/oz): 1/2 gallon vodka 1 - Frequency: daily 1 - Duration: past month 1 - Last Use / Amount: 06/01/12 Name of Substance 2: Cocaine 2 - Age of First Use: 46 2 - Amount (size/oz): a couple of grams 2 - Frequency: twice a day 2 - Duration:  off and on 2 - Last Use / Amount: 3 days ago  Current Place of Residence: [place], [place]  Place of Birth: [place], [place]  Family Members: None reported  Marital Status:  Single  Children: 0  Sons: 0  Daughters: 0  Relationships: Single  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  Hobbies/Interests: None reported  Family History:   Family History  Problem Relation Age of Onset  . Diabetes Mellitus II Father     Results for orders placed during the hospital encounter of 06/02/12 (from the past 72 hour(s))  ACETAMINOPHEN LEVEL     Status: Normal   Collection Time   06/02/12  3:55 AM      Component Value Range Comment   Acetaminophen (Tylenol), Serum <15.0  10 - 30 ug/mL   CBC     Status: Normal   Collection Time   06/02/12  3:55 AM      Component Value Range Comment   WBC 8.2  4.0 - 10.5  K/uL    RBC 4.94  4.22 - 5.81 MIL/uL    Hemoglobin 16.5  13.0 - 17.0 g/dL    HCT 40.9  81.1 - 91.4 %    MCV 93.1  78.0 - 100.0 fL    MCH 33.4  26.0 - 34.0 pg    MCHC 35.9  30.0 - 36.0 g/dL    RDW 78.2  95.6 - 21.3 %    Platelets 201  150 - 400 K/uL   COMPREHENSIVE METABOLIC PANEL     Status: Abnormal   Collection Time   06/02/12  3:55 AM      Component Value Range Comment   Sodium 137  135 - 145 mEq/L    Potassium 3.4 (*) 3.5 - 5.1 mEq/L    Chloride 98  96 - 112 mEq/L    CO2 22  19 - 32 mEq/L    Glucose, Bld 128 (*) 70 - 99 mg/dL    BUN 4 (*) 6 - 23 mg/dL    Creatinine, Ser 0.86  0.50 - 1.35 mg/dL    Calcium 8.6  8.4 - 57.8 mg/dL    Total Protein 7.3  6.0 - 8.3 g/dL    Albumin 3.5  3.5 - 5.2 g/dL    AST 42 (*) 0 - 37 U/L    ALT 39  0 - 53 U/L    Alkaline Phosphatase 129 (*) 39 - 117 U/L     Total Bilirubin 0.4  0.3 - 1.2 mg/dL    GFR calc non Af Amer >90  >90 mL/min    GFR calc Af Amer >90  >90 mL/min   ETHANOL     Status: Abnormal   Collection Time   06/02/12  3:55 AM      Component Value Range Comment   Alcohol, Ethyl (B) 346 (*) 0 - 11 mg/dL   SALICYLATE LEVEL     Status: Abnormal   Collection Time   06/02/12  3:55 AM      Component Value Range Comment   Salicylate Lvl <2.0 (*) 2.8 - 20.0 mg/dL   URINE CULTURE     Status: Normal   Collection Time   06/02/12  4:00 AM      Component Value Range Comment   Specimen Description URINE, CLEAN CATCH      Special Requests NONE      Culture  Setup Time 06/02/2012 09:19      Colony Count NO GROWTH      Culture NO GROWTH      Report Status 06/03/2012 FINAL     URINE RAPID DRUG SCREEN (HOSP PERFORMED)     Status: Abnormal   Collection Time   06/02/12  4:07 AM      Component Value Range Comment   Opiates NONE DETECTED  NONE DETECTED    Cocaine POSITIVE (*) NONE DETECTED    Benzodiazepines NONE DETECTED  NONE DETECTED    Amphetamines NONE DETECTED  NONE DETECTED    Tetrahydrocannabinol NONE DETECTED  NONE DETECTED    Barbiturates NONE DETECTED  NONE DETECTED   URINALYSIS, ROUTINE W REFLEX MICROSCOPIC     Status: Abnormal   Collection Time   06/02/12  4:07 AM      Component Value Range Comment   Color, Urine AMBER (*) YELLOW BIOCHEMICALS MAY BE AFFECTED BY COLOR   APPearance CLOUDY (*) CLEAR    Specific Gravity, Urine 1.023  1.005 - 1.030    pH 5.5  5.0 - 8.0    Glucose,  UA NEGATIVE  NEGATIVE mg/dL    Hgb urine dipstick NEGATIVE  NEGATIVE    Bilirubin Urine SMALL (*) NEGATIVE    Ketones, ur 15 (*) NEGATIVE mg/dL    Protein, ur 960 (*) NEGATIVE mg/dL    Urobilinogen, UA 0.2  0.0 - 1.0 mg/dL    Nitrite NEGATIVE  NEGATIVE    Leukocytes, UA MODERATE (*) NEGATIVE   URINE MICROSCOPIC-ADD ON     Status: Abnormal   Collection Time   06/02/12  4:07 AM      Component Value Range Comment   WBC, UA 11-20  <3 WBC/hpf     Bacteria, UA FEW (*) RARE    Casts HYALINE CASTS (*) NEGATIVE    Urine-Other MUCOUS PRESENT     ETHANOL     Status: Abnormal   Collection Time   06/02/12 10:50 AM      Component Value Range Comment   Alcohol, Ethyl (B) 161 (*) 0 - 11 mg/dL   GLUCOSE, CAPILLARY     Status: Abnormal   Collection Time   06/02/12 11:37 AM      Component Value Range Comment   Glucose-Capillary 218 (*) 70 - 99 mg/dL    Psychological Evaluations:  Assessment:   AXIS I:  Alcohol Abuse and Cocaine abuse AXIS II:  Deferred AXIS III:   Past Medical History  Diagnosis Date  . Hypertension   . Chronic back pain   . Alcohol abuse   . GERD (gastroesophageal reflux disease)   . Gastritis   . Atypical chest pain   . Chronic pain   . Alcoholic liver disease    AXIS IV:  other psychosocial or environmental problems and Substance abuse issues. AXIS V:  41-50 serious symptoms  Treatment Plan/Recommendations: 1. Admit for crisis management, detoxification and stabilization.  2. Medication management to reduce current symptoms to base line and improve the patient's overall level of functioning  3. Treat health problems as indicated.  4. Develop treatment plan to decrease risk of relapse upon discharge and the need for readmission.  5. Psycho-social education regarding relapse prevention and self care.  6. Health care follow up as needed for medical problems.  7. Review and reinstate any pertinent home medications for other health issues where appropriate.   Treatment Plan Summary: Daily contact with patient to assess and evaluate symptoms and progress in treatment Medication management Current Medications:  Current Facility-Administered Medications  Medication Dose Route Frequency Provider Last Rate Last Dose  . acetaminophen (TYLENOL) tablet 650 mg  650 mg Oral Q6H PRN Shuvon Rankin, NP      . alum & mag hydroxide-simeth (MAALOX/MYLANTA) 200-200-20 MG/5ML suspension 30 mL  30 mL Oral Q4H PRN Shuvon Rankin,  NP      . chlordiazePOXIDE (LIBRIUM) capsule 25 mg  25 mg Oral Q6H PRN Shuvon Rankin, NP   25 mg at 06/03/12 0542  . chlordiazePOXIDE (LIBRIUM) capsule 25 mg  25 mg Oral QID Shuvon Rankin, NP   25 mg at 06/03/12 4540   Followed by  . chlordiazePOXIDE (LIBRIUM) capsule 25 mg  25 mg Oral TID Shuvon Rankin, NP       Followed by  . chlordiazePOXIDE (LIBRIUM) capsule 25 mg  25 mg Oral BH-qamhs Shuvon Rankin, NP       Followed by  . chlordiazePOXIDE (LIBRIUM) capsule 25 mg  25 mg Oral Daily Shuvon Rankin, NP      . hydrOXYzine (ATARAX/VISTARIL) tablet 25 mg  25 mg Oral Q6H PRN Shuvon Rankin, NP  25 mg at 06/03/12 0543  . loperamide (IMODIUM) capsule 2-4 mg  2-4 mg Oral PRN Shuvon Rankin, NP   4 mg at 06/02/12 2240  . magnesium hydroxide (MILK OF MAGNESIA) suspension 30 mL  30 mL Oral Daily PRN Shuvon Rankin, NP      . multivitamin with minerals tablet 1 tablet  1 tablet Oral Daily Shuvon Rankin, NP   1 tablet at 06/03/12 0807  . naproxen (NAPROSYN) tablet 500 mg  500 mg Oral BID PRN Nehemiah Settle, MD   500 mg at 06/03/12 0543  . nicotine (NICODERM CQ - dosed in mg/24 hours) patch 21 mg  21 mg Transdermal Daily Shuvon Rankin, NP   21 mg at 06/03/12 0806  . nitrofurantoin (macrocrystal-monohydrate) (MACROBID) capsule 100 mg  100 mg Oral Q12H Shuvon Rankin, NP   100 mg at 06/03/12 0827  . ondansetron (ZOFRAN-ODT) disintegrating tablet 4 mg  4 mg Oral Q6H PRN Shuvon Rankin, NP   4 mg at 06/03/12 0543  . thiamine (VITAMIN B-1) tablet 100 mg  100 mg Oral Daily Shuvon Rankin, NP   100 mg at 06/03/12 9604    Observation Level/Precautions:  15 minute checks  Laboratory:  Reviewed ED lab findings on file.  Psychotherapy:  Group sessions  Medications:  See medication lists  Consultations:  None indicated at this time  Discharge Concerns:  Safety and maintaining sobriety  Estimated LOS: 3-5 days  Other:     I certify that inpatient services furnished can reasonably be expected to improve the  patient's condition.   [name] I 12/21/201310:12 AM

## 2012-06-03 NOTE — Progress Notes (Signed)
Patient ID: Brent Gay, male   DOB: 1966/03/11, 46 y.o.   MRN: [id number] D)  Pt was admitted this evening, states he feels anxious, slightly nauseated, aching all over, slightly diaphoretic, squinting, states head hurts and feels generally miserable.  Was started on the librium protocol, given zofran for nausea, naproxen for the achiness, and some heat packs for his back.  Was given some gatorade, and later some ginger ale and a snack.  Stated stomach was a little better, also having diarrhea and was given imodium.  Went back to bed, resting .   A)  Will continue to monitor q 15 minutes for safety,  Continue POC, protocol. R)  Safety maintained

## 2012-06-03 NOTE — Progress Notes (Signed)
Patient ID: Brent Gay, male   DOB: May 18, 1966, 46 y.o.   MRN: [id number] 06-03-12 nursing shift note: D: pt has had a difficult detox. He has had complaints of pain, hypertension and anxiety. A: he detox symptoms have been addressed with naproxen for pain, extra hypertension medication, vistaril for anxiety, zofran for n/v and imodium for diarrhea. R: responses have been positive with the medication given.  rn will monitor and q 15 min cks continue.

## 2012-06-04 DIAGNOSIS — F1910 Other psychoactive substance abuse, uncomplicated: Secondary | ICD-10-CM | POA: Diagnosis present

## 2012-06-04 DIAGNOSIS — F1920 Other psychoactive substance dependence, uncomplicated: Secondary | ICD-10-CM | POA: Diagnosis present

## 2012-06-04 LAB — GLUCOSE, CAPILLARY: Glucose-Capillary: 108 mg/dL — ABNORMAL HIGH (ref 70–99)

## 2012-06-04 LAB — HEMOGLOBIN A1C: Hgb A1c MFr Bld: 5 % (ref <5.7)

## 2012-06-04 MED ORDER — GABAPENTIN 100 MG PO CAPS
100.0000 mg | ORAL_CAPSULE | Freq: Three times a day (TID) | ORAL | Status: DC
  Administered 2012-06-04 – 2012-06-06 (×5): 100 mg via ORAL
  Filled 2012-06-04 (×12): qty 1

## 2012-06-04 MED ORDER — MENTHOL 3 MG MT LOZG
1.0000 | LOZENGE | OROMUCOSAL | Status: DC | PRN
Start: 2012-06-04 — End: 2012-06-15
  Administered 2012-06-04 (×3): 3 mg via ORAL

## 2012-06-04 MED ORDER — LISINOPRIL 10 MG PO TABS
10.0000 mg | ORAL_TABLET | Freq: Every day | ORAL | Status: DC
  Administered 2012-06-04 – 2012-06-14 (×11): 10 mg via ORAL
  Filled 2012-06-04 (×3): qty 1
  Filled 2012-06-04: qty 14
  Filled 2012-06-04 (×10): qty 1

## 2012-06-04 MED ORDER — IBUPROFEN 200 MG PO TABS
400.0000 mg | ORAL_TABLET | Freq: Four times a day (QID) | ORAL | Status: DC | PRN
  Administered 2012-06-04 – 2012-06-10 (×6): 400 mg via ORAL
  Filled 2012-06-04 (×7): qty 2

## 2012-06-04 NOTE — Progress Notes (Signed)
Psychoeducational Group Note  Date:  06/04/2012 Time:  1515  Group Topic/Focus:  Conflict Resolution:   The focus of this group is to discuss the conflict resolution process and how it may be used upon discharge.  Participation Level:  Active  Participation Quality:  Appropriate, Sharing and Supportive  Affect:  Appropriate  Cognitive:  Appropriate  Insight:  Engaged  Engagement in Group:  Engaged  Additional Comments:  None   Darlin Stenseth A 06/04/2012, 6:57 PM

## 2012-06-04 NOTE — Progress Notes (Signed)
D.  Pt with complaint of pain on approach, also complaint of withdrawal anxiety.  Did not attend evening AA group due to not feeling well.  Reports generalized body aches as well as chronic back pain.  Denies SI/HI/hallucinations at this time.  Went to bed early.  A.  Support and encouragement offered.  Medication given as ordered for symptoms.  R.  Pt currently in bed resting with eyes closed . Respirations even and unlabored.

## 2012-06-04 NOTE — Progress Notes (Signed)
Patient did not attend the evening speaker AA meeting. Pt remained in bed awake.

## 2012-06-04 NOTE — H&P (Signed)
Reviewed the information documented and agree with the treatment plan.  Jahmeir Geisen,JANARDHAHA R. 06/04/2012 10:14 AM

## 2012-06-04 NOTE — Clinical Social Work Note (Signed)
[facility] Group Notes:  (Clinical Social Work)  06/04/2012  10:00-11:00AM  Summary of Progress/Problems:   The main focus of today's process group was for the patient to define "support" and describe what healthy supports are, then to identify the patient's current support system and decide on other supports that can be put in place to prevent future hospitalizations.   An emphasis was placed on using therapist, doctor and problem-specific support groups to expand supports.  The patient expressed that he currently has no supports.  He was able to identify places he can go to start building a new support system.  Type of Therapy:  Process Group  Participation Level:  Active  Participation Quality:  Appropriate, Attentive and Sharing  Affect:  Flat  Cognitive:  Oriented  Insight:  Engaged  Engagement in Therapy:  Engaged  Modes of Intervention:  Clarification, Education, Limit-setting, Problem-solving, Socialization, Support and Processing, Exploration, Discussion, Role-Play   Ambrose Mantle, LCSW 06/04/2012, 12:12 PM

## 2012-06-04 NOTE — Progress Notes (Addendum)
Patient ID: Brent Gay, male   DOB: 07-02-1965, 46 y.o.   MRN: [id number] D)  Was lying in bed this evening, didn't want to go to group, didn't want to get out of bed and go to the med window, wanted meds brought to him.  Wasn't interested in a snack, just wanted librium and vistaril, stated was feeling anxious, wanted to know if he could have more naproxen, but had had both doses for the day.  Irritable, stated having a rough day. A)  Encouraged fluids, meds given, will continue to monitor q 15 minutes for safety. R)  Safety maintained.    03:00  D)  Is up, dressed,, out in the room off med room working on a puzzle, asked for prn librium and vistaril.  States he's getting his appetite back and now wants cookies and milk and also a sleeping pill.  Reminded him that after 02:00 sleeping meds aren't given.  Coughing and blowing his nose in a wash cloth, was instructed to put it in dirty clothes bag and sprayed his hands with Purell sanitizer.  Instructed in hand hygiene and given a box of tissues.

## 2012-06-04 NOTE — Progress Notes (Signed)
Psychoeducational Group Note  Date:  06/04/2012 Time:  1315pm  Group Topic/Focus:  Making Healthy Choices:   The focus of this group is to help patients identify negative/unhealthy choices they were using prior to admission and identify positive/healthier coping strategies to replace them upon discharge.  Participation Level:  Active  Participation Quality:  Appropriate  Affect:  Appropriate  Cognitive:  Appropriate  Insight:  Supportive  Engagement in Group:  Supportive  Additional Comments:  Jeannie rn did the group   Valente David 06/04/2012,2:03 PM

## 2012-06-04 NOTE — Progress Notes (Signed)
Patient ID: Brent Gay, male   DOB: 1965/10/10, 46 y.o.   MRN: [id number] 06-04-12 nursing shift note: pt continues to have a difficult detox, with moderate anxiety.  He also stated he has strep throat. A: pt was given vistaril prn to address his anxiety. np assessed his throat and determined it was not strep. She ordered cough drops. R: symptoms improved with the cough drops. His anxiety was decreased. rn will monitor and q 15 min cks continue.

## 2012-06-04 NOTE — Progress Notes (Signed)
[facility] MD Progress Note  06/04/2012 2:28 PM Brent Gay  MRN:  [id number]  Subjective:  "I am having bad withdrawal symptoms, headaches, cramping, sweating and some tremors. My throat is sore. I did not sleep well last. I feel very anxious. I need something for anxiety".   Diagnosis:   Axis I: Polysubstance dependence including opioid type drug, continuous use Axis II: Deferred Axis III:  Past Medical History  Diagnosis Date  . Hypertension   . Chronic back pain   . Alcohol abuse   . GERD (gastroesophageal reflux disease)   . Gastritis   . Atypical chest pain   . Chronic pain   . Alcoholic liver disease    Axis IV: other psychosocial or environmental problems Axis V: 41-50 serious symptoms  ADL's:  Intact  Sleep: Poor  Appetite:  Fair  Suicidal Ideation:  Plan:  Denies Intent:  Denies Means:  Denies Homicidal Ideation:  Plan:  Denies Intent:  Denies Means:  Denies  AEB (as evidenced by): Per patient's reports.  Psychiatric Specialty Exam: Review of Systems  Constitutional: Negative.   Eyes: Negative.   Respiratory: Negative.   Cardiovascular: Negative.   Gastrointestinal: Positive for abdominal pain.  Genitourinary: Negative.   Musculoskeletal: Positive for myalgias.  Skin: Negative.   Neurological: Positive for headaches.  Endo/Heme/Allergies: Negative.   Psychiatric/Behavioral: Negative.     Blood pressure 140/94, pulse 92, temperature 98.7 F (37.1 C), temperature source Oral, resp. rate 18, height 5' 7.5" (1.715 m), weight 94.802 kg (209 lb), SpO2 98.00%.Body mass index is 32.25 kg/(m^2).  General Appearance: Casual  Eye Contact::  Good  Speech:  Clear and Coherent  Volume:  Normal  Mood:  Irritable  Affect:  Flat  Thought Process:  Coherent and Intact  Orientation:  Full (Time, Place, and Person)  Thought Content:  Rumination  Suicidal Thoughts:  No  Homicidal Thoughts:  No  Memory:  Immediate;   Good Recent;   Good Remote;   Good   Judgement:  Impaired  Insight:  Fair  Psychomotor Activity:  Tremor  Concentration:  Fair  Recall:  Good  Akathisia:  No  Handed:  Right  AIMS (if indicated):     Assets:  Desire for Improvement  Sleep:  Number of Hours: 5    Current Medications: Current Facility-Administered Medications  Medication Dose Route Frequency Provider Last Rate Last Dose  . acetaminophen (TYLENOL) tablet 650 mg  650 mg Oral Q6H PRN Shuvon Rankin, NP      . alum & mag hydroxide-simeth (MAALOX/MYLANTA) 200-200-20 MG/5ML suspension 30 mL  30 mL Oral Q4H PRN Shuvon Rankin, NP      . chlordiazePOXIDE (LIBRIUM) capsule 25 mg  25 mg Oral Q6H PRN Shuvon Rankin, NP   25 mg at 06/04/12 0250  . chlordiazePOXIDE (LIBRIUM) capsule 25 mg  25 mg Oral BH-qamhs Shuvon Rankin, NP       Followed by  . chlordiazePOXIDE (LIBRIUM) capsule 25 mg  25 mg Oral Daily Shuvon Rankin, NP      . hydrOXYzine (ATARAX/VISTARIL) tablet 25 mg  25 mg Oral Q6H PRN Shuvon Rankin, NP   25 mg at 06/04/12 1126  . ibuprofen (ADVIL,MOTRIN) tablet 400 mg  400 mg Oral Q6H PRN Sanjuana Kava, NP      . lisinopril (PRINIVIL,ZESTRIL) tablet 10 mg  10 mg Oral Daily Sanjuana Kava, NP      . loperamide (IMODIUM) capsule 2-4 mg  2-4 mg Oral PRN Shuvon Rankin, NP   4  mg at 06/03/12 1252  . magnesium hydroxide (MILK OF MAGNESIA) suspension 30 mL  30 mL Oral Daily PRN Shuvon Rankin, NP      . menthol-cetylpyridinium (CEPACOL) lozenge 3 mg  1 lozenge Oral PRN Sanjuana Kava, NP   3 mg at 06/04/12 1427  . multivitamin with minerals tablet 1 tablet  1 tablet Oral Daily Shuvon Rankin, NP   1 tablet at 06/04/12 1610  . naproxen (NAPROSYN) tablet 500 mg  500 mg Oral BID PRN Nehemiah Settle, MD   500 mg at 06/04/12 1426  . nicotine (NICODERM CQ - dosed in mg/24 hours) patch 21 mg  21 mg Transdermal Daily Shuvon Rankin, NP   21 mg at 06/04/12 0811  . nitrofurantoin (macrocrystal-monohydrate) (MACROBID) capsule 100 mg  100 mg Oral Q12H Shuvon Rankin, NP   100 mg  at 06/04/12 0813  . ondansetron (ZOFRAN-ODT) disintegrating tablet 4 mg  4 mg Oral Q6H PRN Shuvon Rankin, NP   4 mg at 06/03/12 1253  . thiamine (VITAMIN B-1) tablet 100 mg  100 mg Oral Daily Shuvon Rankin, NP   100 mg at 06/04/12 9604    Lab Results:  Results for orders placed during the hospital encounter of 06/02/12 (from the past 48 hour(s))  GLUCOSE, CAPILLARY     Status: Abnormal   Collection Time   06/03/12  8:54 PM      Component Value Range Comment   Glucose-Capillary 122 (*) 70 - 99 mg/dL    Comment 1 Notify RN      Comment 2 Documented in Chart     GLUCOSE, CAPILLARY     Status: Abnormal   Collection Time   06/04/12  6:10 AM      Component Value Range Comment   Glucose-Capillary 108 (*) 70 - 99 mg/dL     Physical Findings: AIMS:  , ,  ,  ,    CIWA:  CIWA-Ar Total: 5  COWS:     Treatment Plan Summary: Daily contact with patient to assess and evaluate symptoms and progress in treatment Medication management  Plan: Start Lisinopril 10 mg daily for increased blood pressure. Add Motrin 400 mg Q 6 hours daily for headache pains. Cepacol lozenges for sore throat. Neurontin 100 mg tid for anxiety. Continue to monitor for response to treatment.  Medical Decision Making Problem Points:  New problem, with no additional work-up planned (3), Review of last therapy session (1) and Review of psycho-social stressors (1)  Data Points:  Review and summation of old records (2) Review of medication regiment & side effects (2) Review of new medications or change in dosage (2)  I certify that inpatient services furnished can reasonably be expected to improve the patient's condition.   [name] I 06/04/2012, 2:28 PM

## 2012-06-05 ENCOUNTER — Encounter (HOSPITAL_COMMUNITY): Payer: Self-pay | Admitting: Psychiatry

## 2012-06-05 DIAGNOSIS — F1020 Alcohol dependence, uncomplicated: Secondary | ICD-10-CM

## 2012-06-05 DIAGNOSIS — F329 Major depressive disorder, single episode, unspecified: Secondary | ICD-10-CM | POA: Diagnosis present

## 2012-06-05 DIAGNOSIS — F1420 Cocaine dependence, uncomplicated: Secondary | ICD-10-CM | POA: Diagnosis present

## 2012-06-05 DIAGNOSIS — F10239 Alcohol dependence with withdrawal, unspecified: Secondary | ICD-10-CM | POA: Diagnosis present

## 2012-06-05 DIAGNOSIS — F341 Dysthymic disorder: Secondary | ICD-10-CM

## 2012-06-05 LAB — GLUCOSE, CAPILLARY: Glucose-Capillary: 139 mg/dL — ABNORMAL HIGH (ref 70–99)

## 2012-06-05 MED ORDER — TRAZODONE HCL 50 MG PO TABS
50.0000 mg | ORAL_TABLET | Freq: Every evening | ORAL | Status: DC | PRN
  Administered 2012-06-05 – 2012-06-14 (×9): 50 mg via ORAL
  Filled 2012-06-05 (×12): qty 1
  Filled 2012-06-05: qty 28

## 2012-06-05 MED ORDER — CITALOPRAM HYDROBROMIDE 10 MG PO TABS
10.0000 mg | ORAL_TABLET | Freq: Every day | ORAL | Status: DC
  Administered 2012-06-05 – 2012-06-06 (×2): 10 mg via ORAL
  Filled 2012-06-05 (×5): qty 1

## 2012-06-05 MED ORDER — CHLORDIAZEPOXIDE HCL 25 MG PO CAPS
25.0000 mg | ORAL_CAPSULE | Freq: Four times a day (QID) | ORAL | Status: DC | PRN
  Administered 2012-06-05 – 2012-06-09 (×14): 25 mg via ORAL
  Filled 2012-06-05 (×13): qty 1

## 2012-06-05 NOTE — Progress Notes (Signed)
D slept poorly last nite even w/meds, appetite is improving, energy level is low and ability to pay attention is improving, depressed 9/10 and hopeless 7/10, WD s/s continue tremors, diarrhea, chills and cravings, denies Si or Hi and no AVH, physical problems are lightheaded, dizziness, headaches, blurred vision and pain, anxious, nervous irritated and tremors, taking meds as ordered and taking any and all prns available at the time, attending group, taking meds as ordered, going to DR for meals A q56min safety checks continue and support offered, encouraged to attend group and participate R patient remains safe on the unit

## 2012-06-05 NOTE — Progress Notes (Signed)
Pt observed in bed with eyes closed.  He responds when his name is called.  He was given his Macrobid dose at this time.  During this conversation, pt asks for several medications.  The RN from earlier shift reported that pt had been doing this all day.  "What can I have now?"  Explained to pt that his protocol prns had expired and he did not have anything available except the Naproxen for pain which he was given for his back/generalized pain.  Contacted MD on call who ordered a sleep med and continued the Librium 25 mg q6h prn which will be given at bedtime.  Gave pt support/encouragement.  Pt states he is still having moderate withdrawal symptoms.  He denies SI/HI/AV.  Safety maintained with q15 minute checks.

## 2012-06-05 NOTE — Clinical Social Work Note (Signed)
[facility] LCSW Group Therapy  06/05/2012  1:15 PM   Type of Therapy:  Group Therapy  Participation Level:  Active  Participation Quality:  Appropriate and Attentive  Affect:  Appropriate  Cognitive:  Alert and Appropriate  Insight:  Developing/Improving  Engagement in Therapy:  Developing/Improving  Modes of Intervention:  Clarification, Confrontation, Discussion, Exploration, Problem-solving, Rapport Building, Socialization and Support  Summary of Progress/Problems: The topic for group today was overcoming obstacles.  Pt discussed overcoming obstacles and what this means for pt. Pt discussed overcoming his addiction and using his faith to help him get through.      Reyes Ivan, LCSWA 06/05/2012 2:22 PM

## 2012-06-05 NOTE — Clinical Social Work Note (Signed)
[facility] LCSW Aftercare Discharge Planning Group Note  06/05/2012 8:45 AM  Participation Quality:  Appropriate and Attentive  Affect:  Appropriate  Cognitive:  Alert and Appropriate  Insight:  Developing/Improving  Engagement in Group:  Developing/Improving  Modes of Intervention:  Clarification, Discussion, Education, Exploration, Orientation, Problem-solving, Rapport Building, Socialization and Support  Summary of Progress/Problems: Pt attended discharge planning group and actively participated in group.  CSW provided pt with today's workbook.  Pt presents with flat affect and depressed mood.  Pt states that he came to the hospital for depression, anxiety and drug and alcohol use.  Pt states that he has no where to go from here but is from [place].  Pt states that he wants to go to long term treatment from here and an [facility] after that.  Pt rates depression and anxiety at a 9 today.  Pt denies SI/HI.  CSW will assess for appropriate referrals.  No further needs voiced by pt at this time.  Safety planning and suicide prevention discussed.  Pt participated in discussion and acknowledged an understanding of the information provided.       Brent Gay, LCSWA 06/05/2012 9:41 AM

## 2012-06-05 NOTE — Progress Notes (Signed)
[facility] MD Progress Note  06/05/2012 1:56 PM Brent Gay  MRN:  [id number] Subjective:  [name] endorses that he has had a very hard time trying to abstain. He is aware of the effect of alcohol on his liver among other consequences coming from it. He admits that he has an underlying depression and would like to address it. He is able to recount all the losses he has had because of his drinking. He endorses readiness to do the work. He states that he will not be able to make it just going to a place for couple of weeks. Thinks he needs longer term treatment Diagnosis:   Axis I: Alcohol Dependence, withdrawal, cocaine dependence, depressive disorder NOS Axis II: Deferred Axis III:  Past Medical History  Diagnosis Date  . Hypertension   . Chronic back pain   . Alcohol abuse   . GERD (gastroesophageal reflux disease)   . Gastritis   . Atypical chest pain   . Chronic pain   . Alcoholic liver disease    Axis IV: as per consequences of his drinking Axis V: 51-60 moderate symptoms  ADL's:  Impaired  Sleep: Fair  Appetite:  Fair  Suicidal Ideation:  Plan:  Denies Intent:  Denies Means:  Denies Homicidal Ideation:  Plan:  Denies Intent:  Denies Means:  Denies AEB (as evidenced by):  Psychiatric Specialty Exam: Review of Systems  Constitutional: Negative.   HENT: Positive for neck pain.   Eyes: Negative.   Respiratory: Negative.   Cardiovascular: Negative.   Gastrointestinal: Negative.   Musculoskeletal: Positive for back pain.  Skin: Negative.   Neurological: Negative.   Endo/Heme/Allergies: Negative.   Psychiatric/Behavioral: Positive for depression and substance abuse. The patient has insomnia.     Blood pressure 135/89, pulse 103, temperature 98.3 F (36.8 C), temperature source Oral, resp. rate 20, height 5' 7.5" (1.715 m), weight 94.802 kg (209 lb), SpO2 98.00%.Body mass index is 32.25 kg/(m^2).  General Appearance: Fairly Groomed  Patent attorney::  Fair  Speech:  Clear  and Coherent  Volume:  Normal  Mood:  Anxious, Depressed and worried  Affect:  Restricted  Thought Process:  Coherent and Goal Directed  Orientation:  Full (Time, Place, and Person)  Thought Content:  WDL  Suicidal Thoughts:  No  Homicidal Thoughts:  No  Memory:  Immediate;   Fair Recent;   Fair Remote;   Fair  Judgement:  Fair  Insight:  superficial  Psychomotor Activity:  Decreased  Concentration:  Fair  Recall:  Fair  Akathisia:  No  Handed:  Right  AIMS (if indicated):     Assets:  Desire for Improvement  Sleep:  Number of Hours: 4.25    Current Medications: Current Facility-Administered Medications  Medication Dose Route Frequency Provider Last Rate Last Dose  . acetaminophen (TYLENOL) tablet 650 mg  650 mg Oral Q6H PRN Shuvon Rankin, NP   650 mg at 06/04/12 2057  . alum & mag hydroxide-simeth (MAALOX/MYLANTA) 200-200-20 MG/5ML suspension 30 mL  30 mL Oral Q4H PRN Shuvon Rankin, NP      . chlordiazePOXIDE (LIBRIUM) capsule 25 mg  25 mg Oral Q6H PRN Shuvon Rankin, NP   25 mg at 06/05/12 1124  . chlordiazePOXIDE (LIBRIUM) capsule 25 mg  25 mg Oral Daily Shuvon Rankin, NP      . citalopram (CELEXA) tablet 10 mg  10 mg Oral Daily Rachael Fee, MD   10 mg at 06/05/12 1137  . gabapentin (NEURONTIN) capsule 100 mg  100  mg Oral TID Sanjuana Kava, NP   100 mg at 06/05/12 1159  . hydrOXYzine (ATARAX/VISTARIL) tablet 25 mg  25 mg Oral Q6H PRN Shuvon Rankin, NP   25 mg at 06/05/12 1124  . ibuprofen (ADVIL,MOTRIN) tablet 400 mg  400 mg Oral Q6H PRN Sanjuana Kava, NP   400 mg at 06/04/12 2313  . lisinopril (PRINIVIL,ZESTRIL) tablet 10 mg  10 mg Oral Daily Sanjuana Kava, NP   10 mg at 06/05/12 1610  . loperamide (IMODIUM) capsule 2-4 mg  2-4 mg Oral PRN Shuvon Rankin, NP   4 mg at 06/03/12 1252  . magnesium hydroxide (MILK OF MAGNESIA) suspension 30 mL  30 mL Oral Daily PRN Shuvon Rankin, NP      . menthol-cetylpyridinium (CEPACOL) lozenge 3 mg  1 lozenge Oral PRN Sanjuana Kava, NP   3 mg  at 06/04/12 1427  . multivitamin with minerals tablet 1 tablet  1 tablet Oral Daily Shuvon Rankin, NP   1 tablet at 06/05/12 9604  . naproxen (NAPROSYN) tablet 500 mg  500 mg Oral BID PRN Nehemiah Settle, MD   500 mg at 06/05/12 1124  . nicotine (NICODERM CQ - dosed in mg/24 hours) patch 21 mg  21 mg Transdermal Daily Shuvon Rankin, NP   21 mg at 06/05/12 0813  . nitrofurantoin (macrocrystal-monohydrate) (MACROBID) capsule 100 mg  100 mg Oral Q12H Shuvon Rankin, NP   100 mg at 06/05/12 0812  . ondansetron (ZOFRAN-ODT) disintegrating tablet 4 mg  4 mg Oral Q6H PRN Shuvon Rankin, NP   4 mg at 06/03/12 1253  . thiamine (VITAMIN B-1) tablet 100 mg  100 mg Oral Daily Shuvon Rankin, NP   100 mg at 06/05/12 5409    Lab Results:  Results for orders placed during the hospital encounter of 06/02/12 (from the past 48 hour(s))  GLUCOSE, CAPILLARY     Status: Abnormal   Collection Time   06/03/12  8:54 PM      Component Value Range Comment   Glucose-Capillary 122 (*) 70 - 99 mg/dL    Comment 1 Notify RN      Comment 2 Documented in Chart     GLUCOSE, CAPILLARY     Status: Abnormal   Collection Time   06/04/12  6:10 AM      Component Value Range Comment   Glucose-Capillary 108 (*) 70 - 99 mg/dL   HEMOGLOBIN W1X     Status: Normal   Collection Time   06/04/12  6:42 AM      Component Value Range Comment   Hemoglobin A1C 5.0  <5.7 %    Mean Plasma Glucose 97  <117 mg/dL   GLUCOSE, CAPILLARY     Status: Abnormal   Collection Time   06/05/12  6:22 AM      Component Value Range Comment   Glucose-Capillary 139 (*) 70 - 99 mg/dL     Physical Findings: AIMS: Facial and Oral Movements Muscles of Facial Expression: None, normal Lips and Perioral Area: None, normal Jaw: None, normal Tongue: None, normal,Extremity Movements Upper (arms, wrists, hands, fingers): None, normal Lower (legs, knees, ankles, toes): None, normal, Trunk Movements Neck, shoulders, hips: None, normal, Overall  Severity Severity of abnormal movements (highest score from questions above): None, normal Incapacitation due to abnormal movements: None, normal Patient's awareness of abnormal movements (rate only patient's report): No Awareness, Dental Status Current problems with teeth and/or dentures?: No Does patient usually wear dentures?: No  CIWA:  CIWA-Ar  Total: 8  COWS:     Treatment Plan Summary: Daily contact with patient to assess and evaluate symptoms and progress in treatment Medication management  Plan: Supportive approach/coping skills/relapse prevention            Pursue and complete the Detox protocol            Trial with Citalopram 10 mg daily (with plans to increase to 20 mg)  Medical Decision Making Problem Points:  Review of psycho-social stressors (1) Data Points:  Review of medication regiment & side effects (2) Review of new medications or change in dosage (2)  I certify that inpatient services furnished can reasonably be expected to improve the patient's condition.   Yehonatan Grandison A 06/05/2012, 1:56 PM

## 2012-06-05 NOTE — Progress Notes (Signed)
Pt did not attend evening AA group.  

## 2012-06-05 NOTE — Tx Team (Signed)
Interdisciplinary Treatment Plan Update (Adult)  Date:  06/05/2012  Time Reviewed:  9:53 AM   Progress in Treatment: Attending groups: Yes Participating in groups:  Yes Taking medication as prescribed: Yes Tolerating medication:  Yes Family/Significant othe contact made:  N/A, no SI or HI on admission  Patient understands diagnosis:  Yes Discussing patient identified problems/goals with staff:  Yes Medical problems stabilized or resolved:  Yes Denies suicidal/homicidal ideation: Yes Issues/concerns per patient self-inventory:  None identified Other: N/A  New problem(s) identified: None Identified  Reason for Continuation of Hospitalization: Anxiety Depression Medication stabilization Withdrawal symptoms  Interventions implemented related to continuation of hospitalization: mood stabilization, medication monitoring and adjustment, group therapy and psycho education, suicide risk assessment, collateral contact, aftercare planning, ongoing physician assessments and safety checks q 15 mins  Additional comments: N/A  Estimated length of stay: 3-5 days  Discharge Plan: CSW is assessing for appropriate referrals.    New goal(s): N/A  Review of initial/current patient goals per problem list:    1.  Goal(s): Address substance use by completing detox protocol  Met:  No  Target date: 4 days  As evidenced by: completing detox protocol and stable  2.  Goal (s): Reduce depressive symptoms from a 10 to a 3  Met:  No  Target date: 3-5 days  As evidenced by: Pt rates at a 9 today.   3.  Goal (s): Reduce anxiety symptoms from a 10 to a 3  Met:  No  Target date:  3-5 days  As evidenced by: Pt rates at a 9 today.    Attendees: Patient:     Family:     Physician: Geoffery Lyons, MD 06/05/2012 9:53 AM   Nursing: Robbie Louis, RN 06/05/2012 9:53 AM   Clinical Social Worker:  Reyes Ivan, LCSWA 06/05/2012  9:53 AM   Other: Alease Frame, RN 06/05/2012  9:53 AM   Other:   Verna Czech, LCSW 06/05/2012 9:54 AM   Other:  Serena Colonel, NP 06/05/2012 9:54 AM   Other:     Other:      Scribe for Treatment Team:   Reyes Ivan 06/05/2012 9:53 AM

## 2012-06-05 NOTE — Progress Notes (Signed)
Psychoeducational Group Note  Date:  06/05/2012 Time:  1100  Group Topic/Focus:  Wellness Toolbox:   The focus of this group is to discuss various aspects of wellness, balancing those aspects and exploring ways to increase the ability to experience wellness.  Patients will create a wellness toolbox for use upon discharge.  Participation Level:  Active  Participation Quality:  Appropriate and Attentive  Affect:  Appropriate  Cognitive:  Appropriate  Insight:  Engaged  Engagement in Group:  Engaged  Additional Comments:  Pt was very supportative and sharing while attending group. Pt shared that he is isolating and feel that he needs a structured environment.   Sharyn Lull 06/05/2012, 1:15 PM

## 2012-06-06 DIAGNOSIS — F419 Anxiety disorder, unspecified: Secondary | ICD-10-CM | POA: Diagnosis present

## 2012-06-06 MED ORDER — CITALOPRAM HYDROBROMIDE 20 MG PO TABS
20.0000 mg | ORAL_TABLET | Freq: Every day | ORAL | Status: DC
  Administered 2012-06-07 – 2012-06-14 (×8): 20 mg via ORAL
  Filled 2012-06-06: qty 14
  Filled 2012-06-06 (×9): qty 1

## 2012-06-06 MED ORDER — HYDROXYZINE HCL 25 MG PO TABS
25.0000 mg | ORAL_TABLET | Freq: Four times a day (QID) | ORAL | Status: DC | PRN
  Administered 2012-06-06 – 2012-06-14 (×24): 25 mg via ORAL
  Filled 2012-06-06 (×4): qty 1
  Filled 2012-06-06: qty 30
  Filled 2012-06-06 (×4): qty 1

## 2012-06-06 MED ORDER — GABAPENTIN 300 MG PO CAPS
300.0000 mg | ORAL_CAPSULE | Freq: Three times a day (TID) | ORAL | Status: DC
  Administered 2012-06-06 – 2012-06-08 (×7): 300 mg via ORAL
  Filled 2012-06-06 (×10): qty 1

## 2012-06-06 MED ORDER — ONDANSETRON 4 MG PO TBDP
4.0000 mg | ORAL_TABLET | Freq: Three times a day (TID) | ORAL | Status: DC | PRN
  Administered 2012-06-06 – 2012-06-14 (×6): 4 mg via ORAL
  Filled 2012-06-06 (×2): qty 1

## 2012-06-06 NOTE — Progress Notes (Signed)
Pt continues to ask for prn medications frequently and has a paper he carries around with his meds written down and when he can get another dose.  He is up at the med window constantly.  He is manipulative when asking, trying to get more meds and more often than ordered.  He did attend evening group.  He c/o anxiety and withdrawal symptoms, but he is not visibly tremulous.  He has been out of bed all evening as opposed to last night when he was in bed all evening.  He denies SI/HI/AV.  He makes his needs known to staff.  He was given support and encouragement.  He has been encouraged by this RN and the earlier RN to try to use other coping skills rather than asking for so much medication.  Pt is unsure what he will do at discharge.  Safety maintained with q15 minute checks.

## 2012-06-06 NOTE — Progress Notes (Signed)
D slept w/meds last nite, appetite is improving, energy level is low and ability to pay attention is improving, depression 9/10 and hopeless 7/10, WD s/s include tremors, cravings, agitation and chills, denies Si or hi, has chronic back pain and shoulder pain, wants to develop a support system, go to AA, structure housing, attending group, taking meds as ordered, eating meals in the DR, patient is requesting prn meds all the time, he is not taking advantage of his detox, he is taking advantage of the prn meds and taking them at the exact time 6 hours, 8 hours etc. Patient was informed of this and the possibility of him having a harder time getting off the meds and ETOH if he continues to take the prn meds around the clock, flat affect, anxious. A q41min safety checks continue and support offered, did not attend group for exercise today R patient remains safe on the unit

## 2012-06-06 NOTE — Progress Notes (Signed)
[facility] MD Progress Note  06/06/2012 12:29 PM Brent Gay  MRN:  [id number] Subjective:  Still with some withdrawal symptoms. Has an "achy" feeling, some tremolousness, feeling restless, still nauseated. Endorses some benefit from the Neurontin as e well as the vistaril. He wants to pursue the Citalopram further as feels he has an underlying depression Diagnosis:   Axis I: Alcohol Dependence, withdrawal, cocaine dependence, anxiety disorder nos, depressive disorder nos Axis II: Deferred Axis III:  Past Medical History  Diagnosis Date  . Hypertension   . Chronic back pain   . Alcohol abuse   . GERD (gastroesophageal reflux disease)   . Gastritis   . Atypical chest pain   . Chronic pain   . Alcoholic liver disease    Axis IV: as per alcohol, drug use Axis V: 51-60 moderate symptoms  ADL's:  Impaired  Sleep: Fair  Appetite:  Fair  Suicidal Ideation:  Plan:  Denies Intent:  Denies Means:  Denies Homicidal Ideation:  Plan:  Denies Intent:  Denies Means:  Denies AEB (as evidenced by):  Psychiatric Specialty Exam: Review of Systems  Constitutional: Positive for malaise/fatigue.  HENT: Negative.   Eyes: Negative.   Respiratory: Negative.   Cardiovascular: Negative.   Gastrointestinal: Positive for nausea.  Genitourinary: Negative.   Musculoskeletal: Positive for back pain.  Skin: Negative.   Neurological: Positive for tremors.  Psychiatric/Behavioral: Positive for substance abuse. The patient is nervous/anxious.     Blood pressure 115/76, pulse 116, temperature 98 F (36.7 C), temperature source Oral, resp. rate 20, height 5' 7.5" (1.715 m), weight 94.802 kg (209 lb), SpO2 98.00%.Body mass index is 32.25 kg/(m^2).  General Appearance: Fairly Groomed  Patent attorney::  Fair  Speech:  Clear and Coherent, Slow and not spontaneous  Volume:  Decreased  Mood:  Anxious, Depressed and worried  Affect:  Restricted  Thought Process:  Coherent and Goal Directed  Orientation:   Full (Time, Place, and Person)  Thought Content:  somatically focused, worried  Suicidal Thoughts:  No  Homicidal Thoughts:  No  Memory:  Immediate;   Fair Recent;   Fair Remote;   Fair  Judgement:  Fair  Insight:  Present  Psychomotor Activity:  Decreased  Concentration:  Fair  Recall:  Fair  Akathisia:  No  Handed:  Right  AIMS (if indicated):     Assets:  Desire for Improvement  Sleep:  Number of Hours: 6.75    Current Medications: Current Facility-Administered Medications  Medication Dose Route Frequency Provider Last Rate Last Dose  . acetaminophen (TYLENOL) tablet 650 mg  650 mg Oral Q6H PRN Shuvon Rankin, NP   650 mg at 06/04/12 2057  . alum & mag hydroxide-simeth (MAALOX/MYLANTA) 200-200-20 MG/5ML suspension 30 mL  30 mL Oral Q4H PRN Shuvon Rankin, NP   30 mL at 06/06/12 0925  . chlordiazePOXIDE (LIBRIUM) capsule 25 mg  25 mg Oral Q6H PRN Rachael Fee, MD   25 mg at 06/06/12 0925  . citalopram (CELEXA) tablet 20 mg  20 mg Oral Daily Rachael Fee, MD      . gabapentin (NEURONTIN) capsule 300 mg  300 mg Oral TID Rachael Fee, MD   300 mg at 06/06/12 1153  . hydrOXYzine (ATARAX/VISTARIL) tablet 25 mg  25 mg Oral Q6H PRN Rachael Fee, MD   25 mg at 06/06/12 1153  . ibuprofen (ADVIL,MOTRIN) tablet 400 mg  400 mg Oral Q6H PRN Sanjuana Kava, NP   400 mg at 06/06/12 1159  .  lisinopril (PRINIVIL,ZESTRIL) tablet 10 mg  10 mg Oral Daily Sanjuana Kava, NP   10 mg at 06/06/12 0756  . magnesium hydroxide (MILK OF MAGNESIA) suspension 30 mL  30 mL Oral Daily PRN Shuvon Rankin, NP      . menthol-cetylpyridinium (CEPACOL) lozenge 3 mg  1 lozenge Oral PRN Sanjuana Kava, NP   3 mg at 06/04/12 1427  . multivitamin with minerals tablet 1 tablet  1 tablet Oral Daily Shuvon Rankin, NP   1 tablet at 06/06/12 0756  . naproxen (NAPROSYN) tablet 500 mg  500 mg Oral BID PRN Nehemiah Settle, MD   500 mg at 06/05/12 1948  . nicotine (NICODERM CQ - dosed in mg/24 hours) patch 21 mg  21 mg  Transdermal Daily Shuvon Rankin, NP   21 mg at 06/06/12 0800  . nitrofurantoin (macrocrystal-monohydrate) (MACROBID) capsule 100 mg  100 mg Oral Q12H Shuvon Rankin, NP   100 mg at 06/06/12 0756  . ondansetron (ZOFRAN-ODT) disintegrating tablet 4 mg  4 mg Oral Q8H PRN Rachael Fee, MD      . thiamine (VITAMIN B-1) tablet 100 mg  100 mg Oral Daily Shuvon Rankin, NP   100 mg at 06/06/12 0756  . traZODone (DESYREL) tablet 50 mg  50 mg Oral QHS PRN,MR X 1 Rachael Fee, MD   50 mg at 06/05/12 2200    Lab Results:  Results for orders placed during the hospital encounter of 06/02/12 (from the past 48 hour(s))  GLUCOSE, CAPILLARY     Status: Abnormal   Collection Time   06/05/12  6:22 AM      Component Value Range Comment   Glucose-Capillary 139 (*) 70 - 99 mg/dL     Physical Findings: AIMS: Facial and Oral Movements Muscles of Facial Expression: None, normal Lips and Perioral Area: None, normal Jaw: None, normal Tongue: None, normal,Extremity Movements Upper (arms, wrists, hands, fingers): None, normal Lower (legs, knees, ankles, toes): None, normal, Trunk Movements Neck, shoulders, hips: None, normal, Overall Severity Severity of abnormal movements (highest score from questions above): None, normal Incapacitation due to abnormal movements: None, normal Patient's awareness of abnormal movements (rate only patient's report): No Awareness, Dental Status Current problems with teeth and/or dentures?: No Does patient usually wear dentures?: No  CIWA:  CIWA-Ar Total: 3  COWS:     Treatment Plan Summary: Daily contact with patient to assess and evaluate symptoms and progress in treatment Medication management  Plan: Supportive approach/coping skills/relapse prevention           Pursue and complete the Detox           Increase the Neurontin to 300 mg TID to address the anxiety, the pain, the cravings                                  Celexa to 20 mg daily            Resume Vistaril 25 mg  every 6 hrs PRN  anxiety Medical Decision Making Problem Points:  Established problem, stable/improving (1), Review of last therapy session (1) and Review of psycho-social stressors (1) Data Points:  Review of medication regiment & side effects (2) Review of new medications or change in dosage (2)  I certify that inpatient services furnished can reasonably be expected to improve the patient's condition.   Timothea Bodenheimer A 06/06/2012, 12:29 PM

## 2012-06-06 NOTE — Progress Notes (Signed)
Psychoeducational Group Note  Date:  06/06/2012 Time:  2000  Group Topic/Focus:  AA  Participation Level:  Minimal  Participation Quality:  Appropriate  Affect:  Blunted  Cognitive:  Alert  Insight:  Limited  Engagement in Group:  Limited  Additional Comments:    Brent Gay Monique 06/06/2012, 8:57 PM

## 2012-06-07 NOTE — Progress Notes (Signed)
Brent Gay did not fill out self inventory sheet today, states he is slowly getting there, slept well last nite, going to DR for meals, taking meds as ordered, WD s/s include tremors, denies Si or Hi, interacting w/peers/staff in dayroom A q9mins safety checks continue and support offered R patient remains safe on the unit

## 2012-06-07 NOTE — Progress Notes (Signed)
Patient ID: Brent Gay, male   DOB: [birth year], 46 y.o.   MRN: [id number] Resting in bed with eyes closed. No distress noted. Will continue to monitor.

## 2012-06-07 NOTE — Progress Notes (Signed)
[facility] MD Progress Note  06/07/2012 10:01 AM Brent Gay  MRN:  [id number] Subjective: Furious feels that he is slowly getting there. He thinks that the best gift he can give his family this Christmas is his sobriety. He is concerned in terms of his ability to maintain. He is wanting to go to [facility] to get some more sobriety and do some more recovery work before he is out on his own. He did sleep better last night, Still with some back/side pain, but is getting better Diagnosis:  Cocaine Dependence, Alcohol dependence, withdrawal, Depressive Disorder NOS  ADL's:  Intact  Sleep: Fair  Appetite:  Fair  Suicidal Ideation:  Plan:  Denies Intent:  Denies Means:  Denies Homicidal Ideation:  Plan:  Denies Intent:  Denies Means:  Denies AEB (as evidenced by):  Psychiatric Specialty Exam: Review of Systems  Constitutional: Negative.   HENT: Negative.   Eyes: Negative.   Respiratory: Negative.   Cardiovascular: Negative.   Gastrointestinal: Negative.   Genitourinary: Negative.   Musculoskeletal: Positive for back pain.  Skin: Negative.   Neurological: Negative.   Endo/Heme/Allergies: Negative.   Psychiatric/Behavioral: Positive for depression and substance abuse. The patient is nervous/anxious.     Blood pressure 115/76, pulse 116, temperature 98 F (36.7 C), temperature source Oral, resp. rate 20, height 5' 7.5" (1.715 m), weight 94.802 kg (209 lb), SpO2 98.00%.Body mass index is 32.25 kg/(m^2).  General Appearance: Fairly Groomed  Patent attorney::  Fair  Speech:  Clear and Coherent and Slow  Volume:  Decreased  Mood:  Depressed and worried  Affect:  Restricted  Thought Process:  Coherent and Goal Directed  Orientation:  Full (Time, Place, and Person)  Thought Content:  WDL  Suicidal Thoughts:  No  Homicidal Thoughts:  No  Memory:  Immediate;   Fair Recent;   Fair Remote;   Fair  Judgement:  Fair  Insight:  Present  Psychomotor Activity:  Normal  Concentration:  Fair  Recall:   Fair  Akathisia:  No  Handed:  Right  AIMS (if indicated):     Assets:  Desire for Improvement  Sleep:  Number of Hours: 6.75    Current Medications: Current Facility-Administered Medications  Medication Dose Route Frequency Provider Last Rate Last Dose  . acetaminophen (TYLENOL) tablet 650 mg  650 mg Oral Q6H PRN Shuvon Rankin, NP   650 mg at 06/04/12 2057  . alum & mag hydroxide-simeth (MAALOX/MYLANTA) 200-200-20 MG/5ML suspension 30 mL  30 mL Oral Q4H PRN Shuvon Rankin, NP   30 mL at 06/06/12 0925  . chlordiazePOXIDE (LIBRIUM) capsule 25 mg  25 mg Oral Q6H PRN Rachael Fee, MD   25 mg at 06/07/12 4098  . citalopram (CELEXA) tablet 20 mg  20 mg Oral Daily Rachael Fee, MD   20 mg at 06/07/12 0741  . gabapentin (NEURONTIN) capsule 300 mg  300 mg Oral TID Rachael Fee, MD   300 mg at 06/07/12 0741  . hydrOXYzine (ATARAX/VISTARIL) tablet 25 mg  25 mg Oral Q6H PRN Rachael Fee, MD   25 mg at 06/07/12 1191  . ibuprofen (ADVIL,MOTRIN) tablet 400 mg  400 mg Oral Q6H PRN Sanjuana Kava, NP   400 mg at 06/06/12 1159  . lisinopril (PRINIVIL,ZESTRIL) tablet 10 mg  10 mg Oral Daily Sanjuana Kava, NP   10 mg at 06/07/12 0741  . magnesium hydroxide (MILK OF MAGNESIA) suspension 30 mL  30 mL Oral Daily PRN Shuvon Rankin, NP      .  menthol-cetylpyridinium (CEPACOL) lozenge 3 mg  1 lozenge Oral PRN Sanjuana Kava, NP   3 mg at 06/04/12 1427  . multivitamin with minerals tablet 1 tablet  1 tablet Oral Daily Shuvon Rankin, NP   1 tablet at 06/07/12 0741  . naproxen (NAPROSYN) tablet 500 mg  500 mg Oral BID PRN Nehemiah Settle, MD   500 mg at 06/06/12 2234  . nicotine (NICODERM CQ - dosed in mg/24 hours) patch 21 mg  21 mg Transdermal Daily Shuvon Rankin, NP   21 mg at 06/07/12 0743  . nitrofurantoin (macrocrystal-monohydrate) (MACROBID) capsule 100 mg  100 mg Oral Q12H Shuvon Rankin, NP   100 mg at 06/07/12 0741  . ondansetron (ZOFRAN-ODT) disintegrating tablet 4 mg  4 mg Oral Q8H PRN Rachael Fee, MD   4 mg at 06/06/12 1625  . thiamine (VITAMIN B-1) tablet 100 mg  100 mg Oral Daily Shuvon Rankin, NP   100 mg at 06/07/12 0741  . traZODone (DESYREL) tablet 50 mg  50 mg Oral QHS PRN,MR X 1 Rachael Fee, MD   50 mg at 06/06/12 2239    Lab Results: No results found for this or any previous visit (from the past 48 hour(s)).  Physical Findings: AIMS: Facial and Oral Movements Muscles of Facial Expression: None, normal Lips and Perioral Area: None, normal Jaw: None, normal Tongue: None, normal,Extremity Movements Upper (arms, wrists, hands, fingers): None, normal Lower (legs, knees, ankles, toes): None, normal, Trunk Movements Neck, shoulders, hips: None, normal, Overall Severity Severity of abnormal movements (highest score from questions above): None, normal Incapacitation due to abnormal movements: None, normal Patient's awareness of abnormal movements (rate only patient's report): No Awareness, Dental Status Current problems with teeth and/or dentures?: No Does patient usually wear dentures?: No  CIWA:  CIWA-Ar Total: 4  COWS:     Treatment Plan Summary: Daily contact with patient to assess and evaluate symptoms and progress in treatment Medication management  Plan: Supportive approach/coping skills/relapse prevention           Pursue and complete the Detox           Continue to reassess his mood           Refer to [facility]  Medical Decision Making Problem Points:  Established problem, stable/improving (1), Review of last therapy session (1) and Review of psycho-social stressors (1) Data Points:  Review of new medications or change in dosage (2)  I certify that inpatient services furnished can reasonably be expected to improve the patient's condition.   Lillyen Schow A 06/07/2012, 10:01 AM

## 2012-06-08 MED ORDER — GABAPENTIN 300 MG PO CAPS
600.0000 mg | ORAL_CAPSULE | Freq: Three times a day (TID) | ORAL | Status: DC
  Administered 2012-06-08 – 2012-06-14 (×19): 600 mg via ORAL
  Filled 2012-06-08 (×24): qty 2

## 2012-06-08 NOTE — Progress Notes (Addendum)
Patient has asked several times when it is time for his next vistaril and librium.  Continues to ask nurse to give him this medication before his six hours are up.  Nurse informed patient that medication cannot be pulled until appropriate time.   Vistaril and librium administered to patient at 1900.  Patient at medication window asking when next medication can be given to him.

## 2012-06-08 NOTE — Progress Notes (Signed)
Pt laying in bed resting with eyes closed. Respirations even and unlabored. No distress noted.  

## 2012-06-08 NOTE — Progress Notes (Signed)
Psychoeducational Group Note  Date:  06/08/2012 Time:  1100  Group Topic/Focus:  Conflict Resolution:   The focus of this group is to discuss the conflict resolution process and how it may be used upon discharge.  Participation Level:  Active  Participation Quality:  Appropriate, Sharing and Supportive  Affect:  Appropriate  Cognitive:  Appropriate  Insight:  Supportive  Engagement in Group:  Supportive  Additional Comments:  none  Linsay Vogt, Genia Plants 06/08/2012, 6:38 PM

## 2012-06-08 NOTE — Progress Notes (Signed)
[facility] MD Progress Note  06/08/2012 1:29 PM Brent Gay  MRN:  [id number] Subjective:  Feels that the neurontin helps, but will like to get a higher dose to see if it can help more. He still endorses persistent symptoms of withdrawal. He wants to pursue the Librium further. He will not be able to go to [facility] due to behaviors when he was there before. He is wanting to pursue rehab as feels he is not going to be able to make it if he does not. (Protracted withdrawal?) Diagnosis:  Alcohol Dependence, withdrawal, Cocaine dependence, Depressive disorder NOS, Anxiety Disorder NOS  ADL's:  Intact  Sleep: Fair  Appetite:  Fair  Suicidal Ideation:  Plan:  denies Intent:  denies Means:  denies Homicidal Ideation:  Plan:  denies Intent:  denies Means:  denies AEB (as evidenced by):  Psychiatric Specialty Exam: Review of Systems  Constitutional: Positive for diaphoresis.  HENT: Negative.   Eyes: Negative.   Respiratory: Negative.   Cardiovascular: Negative.   Gastrointestinal: Positive for nausea.  Genitourinary: Negative.   Musculoskeletal: Positive for myalgias and back pain.  Skin: Negative.   Neurological: Positive for tremors.  Endo/Heme/Allergies: Negative.   Psychiatric/Behavioral: Positive for depression and substance abuse. The patient is nervous/anxious.     Blood pressure 110/70, pulse 99, temperature 98.4 F (36.9 C), temperature source Oral, resp. rate 18, height 5' 7.5" (1.715 m), weight 94.802 kg (209 lb), SpO2 98.00%.Body mass index is 32.25 kg/(m^2).  General Appearance: Fairly Groomed  Patent attorney::  Fair  Speech:  Clear and Coherent, Slow and not spontaneous  Volume:  Decreased  Mood:  Anxious and worried  Affect:  Restricted  Thought Process:  Coherent and Goal Directed  Orientation:  Full (Time, Place, and Person)  Thought Content:  worries, concerns, somatically focused  Suicidal Thoughts:  No  Homicidal Thoughts:  No  Memory:  Immediate;   Fair Recent;    Fair Remote;   Fair  Judgement:  Fair  Insight:  Present  Psychomotor Activity:  Decreased  Concentration:  Fair  Recall:  Fair  Akathisia:  No  Handed:  Right  AIMS (if indicated):     Assets:  Desire for Improvement  Sleep:  Number of Hours: 6.75    Current Medications: Current Facility-Administered Medications  Medication Dose Route Frequency Provider Last Rate Last Dose  . acetaminophen (TYLENOL) tablet 650 mg  650 mg Oral Q6H PRN Shuvon Rankin, NP   650 mg at 06/04/12 2057  . alum & mag hydroxide-simeth (MAALOX/MYLANTA) 200-200-20 MG/5ML suspension 30 mL  30 mL Oral Q4H PRN Shuvon Rankin, NP   30 mL at 06/06/12 0925  . chlordiazePOXIDE (LIBRIUM) capsule 25 mg  25 mg Oral Q6H PRN Rachael Fee, MD   25 mg at 06/08/12 1256  . citalopram (CELEXA) tablet 20 mg  20 mg Oral Daily Rachael Fee, MD   20 mg at 06/08/12 0754  . gabapentin (NEURONTIN) capsule 300 mg  300 mg Oral TID Rachael Fee, MD   300 mg at 06/08/12 1256  . hydrOXYzine (ATARAX/VISTARIL) tablet 25 mg  25 mg Oral Q6H PRN Rachael Fee, MD   25 mg at 06/08/12 1256  . ibuprofen (ADVIL,MOTRIN) tablet 400 mg  400 mg Oral Q6H PRN Sanjuana Kava, NP   400 mg at 06/06/12 1159  . lisinopril (PRINIVIL,ZESTRIL) tablet 10 mg  10 mg Oral Daily Sanjuana Kava, NP   10 mg at 06/08/12 0754  . magnesium hydroxide (MILK  OF MAGNESIA) suspension 30 mL  30 mL Oral Daily PRN Shuvon Rankin, NP      . menthol-cetylpyridinium (CEPACOL) lozenge 3 mg  1 lozenge Oral PRN Sanjuana Kava, NP   3 mg at 06/04/12 1427  . multivitamin with minerals tablet 1 tablet  1 tablet Oral Daily Shuvon Rankin, NP   1 tablet at 06/08/12 0754  . naproxen (NAPROSYN) tablet 500 mg  500 mg Oral BID PRN Nehemiah Settle, MD   500 mg at 06/06/12 2234  . nicotine (NICODERM CQ - dosed in mg/24 hours) patch 21 mg  21 mg Transdermal Daily Shuvon Rankin, NP   21 mg at 06/08/12 0758  . nitrofurantoin (macrocrystal-monohydrate) (MACROBID) capsule 100 mg  100 mg Oral Q12H  Shuvon Rankin, NP   100 mg at 06/08/12 0947  . ondansetron (ZOFRAN-ODT) disintegrating tablet 4 mg  4 mg Oral Q8H PRN Rachael Fee, MD   4 mg at 06/06/12 1625  . thiamine (VITAMIN B-1) tablet 100 mg  100 mg Oral Daily Shuvon Rankin, NP   100 mg at 06/08/12 0754  . traZODone (DESYREL) tablet 50 mg  50 mg Oral QHS PRN,MR X 1 Rachael Fee, MD   50 mg at 06/08/12 0034    Lab Results: No results found for this or any previous visit (from the past 48 hour(s)).  Physical Findings: AIMS: Facial and Oral Movements Muscles of Facial Expression: None, normal Lips and Perioral Area: None, normal Jaw: None, normal Tongue: None, normal,Extremity Movements Upper (arms, wrists, hands, fingers): None, normal Lower (legs, knees, ankles, toes): None, normal, Trunk Movements Neck, shoulders, hips: None, normal, Overall Severity Severity of abnormal movements (highest score from questions above): None, normal Incapacitation due to abnormal movements: None, normal Patient's awareness of abnormal movements (rate only patient's report): No Awareness, Dental Status Current problems with teeth and/or dentures?: No Does patient usually wear dentures?: No  CIWA:  CIWA-Ar Total: 4  COWS:     Treatment Plan Summary: Daily contact with patient to assess and evaluate symptoms and progress in treatment Medication management  Plan: Supportive approach/coping skills/relapse prevention           Pursue detox with Librium           Increase the Neurontin to 600 mg TID  Medical Decision Making Problem Points:  Established problem, worsening (2), Review of last therapy session (1) and Review of psycho-social stressors (1) Data Points:  Review of medication regiment & side effects (2) Review of new medications or change in dosage (2)  I certify that inpatient services furnished can reasonably be expected to improve the patient's condition.   Brent Gay A 06/08/2012, 1:29 PM

## 2012-06-08 NOTE — Clinical Social Work Note (Addendum)
[facility] LCSW Aftercare Discharge Planning Group Note  06/08/2012 8:45 AM  Participation Quality:  Appropriate and Attentive  Affect:  Appropriate  Cognitive:  Alert and Appropriate  Insight:  Developing/Improving  Engagement in Group:  Developing/Improving  Modes of Intervention:  Clarification, Discussion, Education, Exploration, Orientation, Problem-solving, Rapport Building, Socialization and Support  Summary of Progress/Problems: Pt attended discharge planning group and actively participated in group.  CSW provided pt with today's workbook.  Pt presents with calm mood and affect.  Pt rates depression at a 7 and anxiety at a 9 today.  Pt denies SI/HI.  Pt reports still having withdrawal symptoms of anxiety and tremors.  Pt wants to go to [facility] for further treatment from here.  CSW will make referral to [facility].  Pt was denied to go to [facility] due to past infractions against male staff and other patients.  CSW will continue to assess for appropriate referrals.  No further needs voiced by pt at this time.    Reyes Ivan, [place] 06/08/2012 9:37 AM

## 2012-06-08 NOTE — Clinical Social Work Note (Signed)
[facility] LCSW Group Therapy  06/08/2012  1:15 PM   Type of Therapy:  Group Therapy  Participation Level:  Active  Participation Quality:  Appropriate and Attentive  Affect:  Appropriate  Cognitive:  Alert and Appropriate  Insight:  Developing/Improving  Engagement in Therapy:  Developing/Improving  Modes of Intervention:  Clarification, Confrontation, Discussion, Education, Exploration, Limit-setting, Problem-solving, Socialization and Support  Summary of Progress/Problems: The topic for group was balance in life.  Pt participated in the discussion about when their life was in balance and out of balance and how this feels.  Pt discussed ways to get back in balance and short term goals they can work on to get where they want to be.  Pt shared that he plans to have a better diet to help keep his life balanced.  Py states that his life was balanced in high school until college, where he met various women.  Pt states that the last relationship he was in, the girl passed away and pt states that he's been using since.  Pt processed feeling of loss with the group.    Brent Gay, LCSWA 06/08/2012 2:10 PM

## 2012-06-09 MED ORDER — CHLORDIAZEPOXIDE HCL 25 MG PO CAPS
25.0000 mg | ORAL_CAPSULE | Freq: Three times a day (TID) | ORAL | Status: DC | PRN
  Administered 2012-06-10 – 2012-06-13 (×8): 25 mg via ORAL
  Filled 2012-06-09 (×10): qty 1

## 2012-06-09 NOTE — Tx Team (Signed)
Interdisciplinary Treatment Plan Update (Adult)  Date:  06/09/2012  Time Reviewed:  9:40 AM   Progress in Treatment: Attending groups: Yes Participating in groups:  Yes Taking medication as prescribed: Yes Tolerating medication:  Yes Family/Significant othe contact made:  No, CSW is assessing for appropriate contact Patient understands diagnosis:  Yes Discussing patient identified problems/goals with staff:  Yes Medical problems stabilized or resolved:  Yes Denies suicidal/homicidal ideation: Yes Issues/concerns per patient self-inventory:  None identified Other: N/A  New problem(s) identified: None Identified  Reason for Continuation of Hospitalization: Anxiety Depression Medication stabilization Withdrawal symptoms  Interventions implemented related to continuation of hospitalization: mood stabilization, medication monitoring and adjustment, group therapy and psycho education, suicide risk assessment, collateral contact, aftercare planning, ongoing physician assessments and safety checks q 15 mins  Additional comments: N/A  Estimated length of stay: 3-5 days  Discharge Plan: Pt is scheduled to go to [facility] on 1/2 for further treatment.     New goal(s): N/A  Review of initial/current patient goals per problem list:    1.  Goal(s): Address substance use by completing detox protocol  Met:  No  Target date: 4 days  As evidenced by: attending groups and actively participated  2.  Goal (s): Reduce depressive symptoms from a 10 to a 3  Met:  No  Target date: 3-5 days  As evidenced by: Pt rates at a 7 today.    3.  Goal (s): Reduce anxiety symptoms from a 10 to a 3  Met:  No  Target date:  3-5 days  As evidenced by: Pt rates at a 9 today.     Attendees: Patient:     Family:     Physician: Geoffery Lyons, MD 06/09/2012 9:40 AM   Nursing: Shelda Jakes, RN 06/09/2012 9:40 AM   Clinical Social Worker:  Reyes Ivan, LCSWA 06/09/2012  9:40 AM     Other: Serena Colonel, NP 06/09/2012  9:40 AM   Other:  Ashley Jacobs, LCSW 06/09/2012 9:48 AM   Other:  Weber Cooks, LCSWA 06/09/2012 9:48 AM   Other:  Verna Czech, LCSW 06/09/2012 9:49 AM   Other:      Scribe for Treatment Team:   Reyes Ivan 06/09/2012 9:40 AM

## 2012-06-09 NOTE — Clinical Social Work Note (Signed)
[facility] LCSW Aftercare Discharge Planning Group Note  06/09/2012 8:45 AM  Participation Quality:  Appropriate and Attentive  Affect:  Appropriate  Cognitive:  Alert and Appropriate  Insight:  Distracting  Engagement in Group:  Developing/Improving  Modes of Intervention:  Clarification, Discussion, Education, Exploration, Orientation, Problem-solving, Rapport Building, Socialization and Support  Summary of Progress/Problems: Pt attended discharge planning group and actively participated in group.  CSW provided pt with today's workbook.  Pt presents with flat affect and depressed mood.  Pt rates depression at a 7 and anxiety at a 9 today.  Pt denies SI/HI.  Pt is anxious about where he will go from here.  Pt was denied to go to [facility].  CSW secured pt a bed at [facility] for 1/2.  CSW also discussed [facility] as an option.  Pt states that he would prefer to stay here until 1/2 and go straight to [facility].  CSW discussed that this may not be an option and pt would have to talk to the dr.  No further needs voiced by pt at this time.  Safety planning and suicide prevention discussed.  Pt participated in discussion and acknowledged an understanding of the information provided.       Reyes Ivan, LCSWA 06/09/2012 9:07 AM

## 2012-06-09 NOTE — Progress Notes (Signed)
[facility] MD Progress Note  06/09/2012 3:16 PM Brent Gay  MRN:  [id number] Subjective:  Brent Gay is still endorsing having issues with his anxiety. He will be able to get to [facility] on Jan the 2nd. He is concerned about his ability to stay sober if he was not to be on a safe environment. He admits that he tends to worry, ruminate about things, this gets him down. Diagnosis:  Alcohol Dependence, Cocaine dependence, Anxiety Disorder NOS, Depressive Disorder NOS  ADL's:  Intact  Sleep: Fair  Appetite:  Fair  Suicidal Ideation:  Plan:  Denies Intent:  Denies Means:  Denies Homicidal Ideation:  Plan:  Denies Intent:  Denies Means:  Denies AEB (as evidenced by):  Psychiatric Specialty Exam: Review of Systems  Constitutional: Negative.   HENT: Negative.   Eyes: Negative.   Respiratory: Negative.   Cardiovascular: Negative.   Gastrointestinal: Positive for nausea.  Genitourinary: Negative.   Musculoskeletal: Negative.   Skin: Negative.   Neurological: Negative.   Endo/Heme/Allergies: Negative.   Psychiatric/Behavioral: Positive for depression and substance abuse. The patient is nervous/anxious.     Blood pressure 113/78, pulse 105, temperature 98.8 F (37.1 C), temperature source Oral, resp. rate 18, height 5' 7.5" (1.715 m), weight 94.802 kg (209 lb), SpO2 98.00%.Body mass index is 32.25 kg/(m^2).  General Appearance: Fairly Groomed  Patent attorney::  Fair  Speech:  Clear and Coherent, Slow and not spontaneous  Volume:  Decreased  Mood:  Anxious, Depressed and worried  Affect:  Restricted  Thought Process:  Coherent and Goal Directed  Orientation:  Full (Time, Place, and Person)  Thought Content:  Rumination and worries and concerns about not being able to stay sober if he was to be out   Suicidal Thoughts:  No  Homicidal Thoughts:  No  Memory:  Immediate;   Fair Recent;   Fair Remote;   Fair  Judgement:  Fair  Insight:  Present  Psychomotor Activity:  Decreased    Concentration:  Fair  Recall:  Fair  Akathisia:  No  Handed:  Right  AIMS (if indicated):     Assets:  Desire for Improvement  Sleep:  Number of Hours: 6.25    Current Medications: Current Facility-Administered Medications  Medication Dose Route Frequency Provider Last Rate Last Dose  . acetaminophen (TYLENOL) tablet 650 mg  650 mg Oral Q6H PRN Shuvon Rankin, NP   650 mg at 06/04/12 2057  . alum & mag hydroxide-simeth (MAALOX/MYLANTA) 200-200-20 MG/5ML suspension 30 mL  30 mL Oral Q4H PRN Shuvon Rankin, NP   30 mL at 06/08/12 2112  . chlordiazePOXIDE (LIBRIUM) capsule 25 mg  25 mg Oral Q6H PRN Rachael Fee, MD   25 mg at 06/09/12 1313  . citalopram (CELEXA) tablet 20 mg  20 mg Oral Daily Rachael Fee, MD   20 mg at 06/09/12 7829  . gabapentin (NEURONTIN) capsule 600 mg  600 mg Oral TID Rachael Fee, MD   600 mg at 06/09/12 1312  . hydrOXYzine (ATARAX/VISTARIL) tablet 25 mg  25 mg Oral Q6H PRN Rachael Fee, MD   25 mg at 06/09/12 1313  . ibuprofen (ADVIL,MOTRIN) tablet 400 mg  400 mg Oral Q6H PRN Sanjuana Kava, NP   400 mg at 06/06/12 1159  . lisinopril (PRINIVIL,ZESTRIL) tablet 10 mg  10 mg Oral Daily Sanjuana Kava, NP   10 mg at 06/09/12 0807  . magnesium hydroxide (MILK OF MAGNESIA) suspension 30 mL  30 mL Oral Daily PRN  Shuvon Rankin, NP      . menthol-cetylpyridinium (CEPACOL) lozenge 3 mg  1 lozenge Oral PRN Sanjuana Kava, NP   3 mg at 06/04/12 1427  . multivitamin with minerals tablet 1 tablet  1 tablet Oral Daily Shuvon Rankin, NP   1 tablet at 06/09/12 0807  . naproxen (NAPROSYN) tablet 500 mg  500 mg Oral BID PRN Nehemiah Settle, MD   500 mg at 06/06/12 2234  . nicotine (NICODERM CQ - dosed in mg/24 hours) patch 21 mg  21 mg Transdermal Daily Shuvon Rankin, NP   21 mg at 06/09/12 0807  . nitrofurantoin (macrocrystal-monohydrate) (MACROBID) capsule 100 mg  100 mg Oral Q12H Shuvon Rankin, NP   100 mg at 06/09/12 0807  . ondansetron (ZOFRAN-ODT) disintegrating tablet 4  mg  4 mg Oral Q8H PRN Rachael Fee, MD   4 mg at 06/06/12 1625  . thiamine (VITAMIN B-1) tablet 100 mg  100 mg Oral Daily Shuvon Rankin, NP   100 mg at 06/09/12 0807  . traZODone (DESYREL) tablet 50 mg  50 mg Oral QHS PRN,MR X 1 Rachael Fee, MD   50 mg at 06/08/12 0034    Lab Results: No results found for this or any previous visit (from the past 48 hour(s)).  Physical Findings: AIMS: Facial and Oral Movements Muscles of Facial Expression: None, normal Lips and Perioral Area: None, normal Jaw: None, normal Tongue: None, normal,Extremity Movements Upper (arms, wrists, hands, fingers): None, normal Lower (legs, knees, ankles, toes): None, normal, Trunk Movements Neck, shoulders, hips: None, normal, Overall Severity Severity of abnormal movements (highest score from questions above): None, normal Incapacitation due to abnormal movements: None, normal Patient's awareness of abnormal movements (rate only patient's report): No Awareness, Dental Status Current problems with teeth and/or dentures?: No Does patient usually wear dentures?: No  CIWA:  CIWA-Ar Total: 4  COWS:     Treatment Plan Summary: Daily contact with patient to assess and evaluate symptoms and progress in treatment Medication management  Plan: Supportive approach/coping skills/relapse prevention           Wean off the Librium in the next 5 days Medical Decision Making Problem Points:  Established problem, worsening (2), discuss psychosocial stressors, work on ways of dealing with the worry, the anxieyt Data Points:  Review of medication regiment & side effects (2)  I certify that inpatient services furnished can reasonably be expected to improve the patient's condition.   Darian Cansler A 06/09/2012, 3:16 PM

## 2012-06-09 NOTE — Progress Notes (Signed)
Psychoeducational Group Note  Date:  06/09/2012 Time:  1120  Group Topic/Focus:  Coping Skills  Participation Level:  Active  Participation Quality:  Appropriate  Affect:  Appropriate  Cognitive:  Appropriate  Insight:  Developing/Improving  Engagement in Group:  Engaged  Additional Comments:  Pt attended and participated in group topic of coping skills. Pt was appropriate and cooperative during group as well. Pt stated that he does not like to be alone but will isolate at times. Pt stated he would rather be around nice people. Pt also shared that he likes to go fishing and listen to music as coping skills. Pt did engage and would brighten when he would be sharing with the group.   Homero Fellers 06/09/2012, 12:56 PM

## 2012-06-09 NOTE — Progress Notes (Signed)
Psychoeducational Group Note  Date:  06/09/2012 Time:  2000  Group Topic/Focus:  AA group  Participation Level:  Minimal  Participation Quality:  Drowsy  Affect:  Flat  Cognitive:  Appropriate  Insight:  Lacking  Engagement in Group:  Lacking  Additional Comments:  Pt went to group for the first 15 mins, then returned to bed stating that he was really tired.  Kaleen Odea R 06/09/2012, 10:23 PM

## 2012-06-09 NOTE — Progress Notes (Signed)
[facility] Group Notes:  (Counselor/Nursing/MHT/Case Management/Adjunct)  06/08/2012 2100  Type of Therapy:  wrap up group  Participation Level:  Active  Participation Quality:  Appropriate, Attentive, Sharing and Supportive  Affect:  Appropriate  Cognitive:  Alert and Appropriate  Insight:  Engaged  Engagement in Group:  Engaged  Engagement in Therapy:  unknown to this Lawyer of Intervention:  Clarification, Education and Support  Summary of Progress/Problems:   Brent Gay 06/09/2012, 12:01 AM

## 2012-06-09 NOTE — Progress Notes (Signed)
D   Pt appears anxious and depressed   He asks for medications frequently and said he would wait up until 1am to get some prn medication  Pt is treatment compliant and is active in groups  A   Verbal support given  Medications administered and effectiveness monitored   Q 15 min checks R   Pt safe at present

## 2012-06-09 NOTE — Progress Notes (Signed)
Psychoeducational Group Note  Date:  06/09/2012 Time:  1000 Group Topic/Focus:  Early Warning Signs:   The focus of this group is to help patients identify signs or symptoms they exhibit before slipping into an unhealthy state or crisis.  Participation Level:  Active  Participation Quality:  Appropriate and Attentive  Affect:  Appropriate  Cognitive:  Alert and Appropriate  Insight:  Engaged  Engagement in Group:  Engaged  Additional Comments:  Pt was very appropriate and attentive while attending group. Pt stated that he thinks of the mistakes he has made when hearing the word relapse. Pt also stated that he remembers when he didn't struggle with alcohol and knows that it can be done again.   Sharyn Lull 06/09/2012, 10:50 AM

## 2012-06-09 NOTE — Clinical Social Work Note (Signed)
[facility] LCSW Group Therapy  06/09/2012  1:15 PM   Type of Therapy:  Group Therapy  Participation Level:  Active  Participation Quality:  Appropriate and Attentive  Affect:  Appropriate  Cognitive:  Alert and Appropriate  Insight:  Developing/Improving  Engagement in Therapy:  Developing/Improving  Modes of Intervention:  Clarification, Confrontation, Discussion, Exploration, Limit-setting, Problem-solving, Rapport Building, Socialization and Support  Summary of Progress/Problems: The topic for today was feelings about relapse.  Pt discussed what relapse prevention is to them and identified triggers that they are on the path to relapse.  Pt processed their feeling towards relapse and was able to relate to peers.  Pt discussed coping skills that can be used for relapse prevention.   Pt actively listened to group discussion but did not participate.    Brent Gay, [place] 06/09/2012 2:31 PM

## 2012-06-09 NOTE — Progress Notes (Signed)
[name] is seen out in the milieu this AM. He is alert, cooperative and attending his groups. He denies SI within the past 24 hrs, he rates his feelings of depression and hopelessness "8/6", respectively and he says his DC plan is to " go to church more, eat better, exercise more, go to meetings, structured living after going to program".  A He is compliant with his medications as ordered .  R Safety is in place and POC fostered with therapeutic relationship.

## 2012-06-09 NOTE — Progress Notes (Signed)
D.  Pt bright and pleasant on approach, positive for evening AA group.  No complaints voiced, went to bed directly after group.  Denied SI/HI/hallucinations at this time.  Interacting appropriately within milieu.  A.  Support and encouragement offered  R.  Will continue to monitor.

## 2012-06-10 ENCOUNTER — Encounter (HOSPITAL_COMMUNITY): Payer: Self-pay | Admitting: Registered Nurse

## 2012-06-10 DIAGNOSIS — F329 Major depressive disorder, single episode, unspecified: Secondary | ICD-10-CM

## 2012-06-10 DIAGNOSIS — F411 Generalized anxiety disorder: Secondary | ICD-10-CM

## 2012-06-10 DIAGNOSIS — F10229 Alcohol dependence with intoxication, unspecified: Secondary | ICD-10-CM

## 2012-06-10 NOTE — Progress Notes (Signed)
D) Pt rates his depression at a 7 and his hopelessness at a 6. Denies SI and HI. Has limited insight into his issues or how to understand how his behaviors get him the results he doesn't want. Was joking in the group this afternoon, not able to see the bigger picture or be serious when the time called for seriousness. A) Given support when appropriate. Provided with simpler explanations for some things Pt did not understand. Praised appropriately. R) Lacks insight into his issues.

## 2012-06-10 NOTE — Progress Notes (Signed)
D.  Pt pleasant and bright on approach, sitting in dayroom interacting with peers.  No complaints voiced.  Denies SI/HI/hallucinations at this time.  Positive for evening AA group.  Interacting appropriately on unit with peers.  A.  Support and encouragement offered  R.  Will continue to monitor.

## 2012-06-10 NOTE — Progress Notes (Signed)
Psychoeducational Group Note  Date:  06/10/2012 Time:  1300  Group Topic/Focus:  Identifying Needs:   The focus of this group is to help patients identify their personal needs that have been historically problematic and identify healthy behaviors to address their needs.  Participation Level:  active Participation Quality: good Affect: flat Cognitivelim : fair   Insight:  good  Engagement in Group: engaged  Additional Comments: Pt was engaged in group, asked appropriate questions, shared personal life experience with the group and demonstrates willingness to process and understand her problems. PDuke RN [facility]

## 2012-06-10 NOTE — Progress Notes (Signed)
Patient ID: Brent Gay, male   DOB: 1965-06-20, 46 y.o.   MRN: [id number] [facility] MD Progress Note  06/10/2012 1:13 PM Brent Gay  MRN:  [id number] Subjective: Patient states that continues to have some of the withdrawal symptoms such as tremors, anxiety, and sweating.  "Patient states that enjoys group.  "All of the employees are very nice.  I really like group and enjoy going"'   Diagnosis:  Alcohol Dependence, Cocaine dependence, Anxiety Disorder NOS, Depressive Disorder NOS  ADL's:  Intact  Sleep: Fair Improving  Appetite:  Fair Improving  Suicidal Ideation:  Plan:  Denies Intent:  Denies Means:  Denies  Homicidal Ideation:  Plan:  Denies Intent:  Denies Means:  Denies  AEB (as evidenced by):  Participating in group, tolerating medications well with out side effects.  Patient plans to go to [facility] after D/Ca and unsure if will need another program afterwards also interested in [place].    Psychiatric Specialty Exam: Review of Systems  Gastrointestinal: Positive for nausea.  Psychiatric/Behavioral: Positive for depression (rates 8/10) and substance abuse. The patient is nervous/anxious (rates 8/10).   All other systems reviewed and are negative.    Blood pressure 115/78, pulse 94, temperature 98.8 F (37.1 C), temperature source Oral, resp. rate 16, height 5' 7.5" (1.715 m), weight 94.802 kg (209 lb), SpO2 98.00%.Body mass index is 32.25 kg/(m^2).  General Appearance: Fairly Groomed  Patent attorney::  Fair  Speech:  Clear and Coherent, Slow and not spontaneous  Volume:  Decreased  Mood:  Anxious, Depressed and worried  Affect:  Restricted  Thought Process:  Coherent and Goal Directed  Orientation:  Full (Time, Place, and Person)  Thought Content:  Rumination and worries and concerns about not being able to stay sober if he was to be out   Suicidal Thoughts:  No  Homicidal Thoughts:  No  Memory:  Immediate;   Fair Recent;   Fair Remote;   Fair  Judgement:  Fair    Insight:  Present  Psychomotor Activity:  Decreased  Concentration:  Fair  Recall:  Fair  Akathisia:  No  Handed:  Right  AIMS (if indicated):     Assets:  Desire for Improvement  Sleep:  Number of Hours: 5.75    Current Medications: Current Facility-Administered Medications  Medication Dose Route Frequency Provider Last Rate Last Dose  . acetaminophen (TYLENOL) tablet 650 mg  650 mg Oral Q6H PRN Shuvon Rankin, NP   650 mg at 06/04/12 2057  . alum & mag hydroxide-simeth (MAALOX/MYLANTA) 200-200-20 MG/5ML suspension 30 mL  30 mL Oral Q4H PRN Shuvon Rankin, NP   30 mL at 06/08/12 2112  . chlordiazePOXIDE (LIBRIUM) capsule 25 mg  25 mg Oral TID PRN Rachael Fee, MD   25 mg at 06/10/12 0439  . citalopram (CELEXA) tablet 20 mg  20 mg Oral Daily Rachael Fee, MD   20 mg at 06/10/12 7829  . gabapentin (NEURONTIN) capsule 600 mg  600 mg Oral TID Rachael Fee, MD   600 mg at 06/10/12 1207  . hydrOXYzine (ATARAX/VISTARIL) tablet 25 mg  25 mg Oral Q6H PRN Rachael Fee, MD   25 mg at 06/10/12 0439  . ibuprofen (ADVIL,MOTRIN) tablet 400 mg  400 mg Oral Q6H PRN Sanjuana Kava, NP   400 mg at 06/06/12 1159  . lisinopril (PRINIVIL,ZESTRIL) tablet 10 mg  10 mg Oral Daily Sanjuana Kava, NP   10 mg at 06/10/12 0824  . magnesium hydroxide (  MILK OF MAGNESIA) suspension 30 mL  30 mL Oral Daily PRN Shuvon Rankin, NP      . menthol-cetylpyridinium (CEPACOL) lozenge 3 mg  1 lozenge Oral PRN Sanjuana Kava, NP   3 mg at 06/04/12 1427  . multivitamin with minerals tablet 1 tablet  1 tablet Oral Daily Shuvon Rankin, NP   1 tablet at 06/10/12 0824  . naproxen (NAPROSYN) tablet 500 mg  500 mg Oral BID PRN Nehemiah Settle, MD   500 mg at 06/06/12 2234  . nicotine (NICODERM CQ - dosed in mg/24 hours) patch 21 mg  21 mg Transdermal Daily Shuvon Rankin, NP   21 mg at 06/10/12 0823  . ondansetron (ZOFRAN-ODT) disintegrating tablet 4 mg  4 mg Oral Q8H PRN Rachael Fee, MD   4 mg at 06/06/12 1625  . thiamine  (VITAMIN B-1) tablet 100 mg  100 mg Oral Daily Shuvon Rankin, NP   100 mg at 06/10/12 0824  . traZODone (DESYREL) tablet 50 mg  50 mg Oral QHS PRN,MR X 1 Rachael Fee, MD   50 mg at 06/08/12 0034    Lab Results: No results found for this or any previous visit (from the past 48 hour(s)).  Physical Findings: AIMS: Facial and Oral Movements Muscles of Facial Expression: None, normal Lips and Perioral Area: None, normal Jaw: None, normal Tongue: None, normal,Extremity Movements Upper (arms, wrists, hands, fingers): None, normal Lower (legs, knees, ankles, toes): None, normal, Trunk Movements Neck, shoulders, hips: None, normal, Overall Severity Severity of abnormal movements (highest score from questions above): None, normal Incapacitation due to abnormal movements: None, normal Patient's awareness of abnormal movements (rate only patient's report): No Awareness, Dental Status Current problems with teeth and/or dentures?: No Does patient usually wear dentures?: No  CIWA:  CIWA-Ar Total: 4  COWS:     Treatment Plan Summary: Daily contact with patient to assess and evaluate symptoms and progress in treatment Medication management  Will continue current plan and treatment.    Plan: Supportive approach/coping skills/relapse prevention           Wean off the Librium in the next 5 days Medical Decision Making Problem Points:Established problem, stable/improving (1), Review of last therapy session (1) and Review of psycho-social stressors (1)   discuss psychosocial stressors, work on ways of dealing with the worry, the anxiety Data Points:  Review of medication regiment & side effects (2), Review or order clinical lab tests (1)   I certify that inpatient services furnished can reasonably be expected to improve the patient's condition.   Rankin, Shuvon 06/10/2012, 1:13 PM

## 2012-06-10 NOTE — Clinical Social Work Psychosocial (Signed)
[facility] Group Notes:  (Clinical Social Work)  06/10/2012  10:00-11:00AM  Summary of Progress/Problems:   The main focus of today's process group was for the patient to identify ways in which they have in the past sabotaged their own recovery and reasons they may have done this/what they received from doing it.  We then worked to identify a specific plan to avoid doing this when discharged from the hospital for this admission.  The patient expressed that his self-sabotaging thought is "I can just do one and back off before it gets bad."   He verbalized understanding the process of cognitive restructuring as it relates to his self-sabotaging statement.  He was very supportive of other patients' statements and feelings as well.  He fell asleep at one point and had to be awakened.  Type of Therapy:  Group Therapy - Process  Participation Level:  Active  Participation Quality:  Drowsy and Sharing  Affect:  Appropriate  Cognitive:  Appropriate and Oriented  Insight:  Limited  Engagement in Therapy:  Engaged  Modes of Intervention:  Clarification, Education, Limit-setting, Problem-solving, Socialization, Support and Processing, Exploration, Discussion   Ambrose Mantle, LCSW 06/10/2012, 12:52 PM

## 2012-06-10 NOTE — Progress Notes (Signed)
Goals  Group Note  Date:  06/10/2012 Time: 0900  Group Topic/Focus:  The focus of the group is to identify goals the patient wants to begin to work towards, to introduce the Saturday  Patient Workbook and to motivate them to take the necessary steps they need to, in order to become healthier. Identifying  Goals : Participation Level:  active Participation Quality: good Affect: flat Cognitive:  good  Insight:  good  Engagement in Group: engaged  Additional Comments:   PD RN [facility]

## 2012-06-10 NOTE — Progress Notes (Signed)
Patient did attend the evening speaker AA meeting.  

## 2012-06-11 NOTE — Progress Notes (Deleted)
D) Pt has been attending the groups. States that she did not sleep well last night. Affect is flat and mood depressed. Rates her depression at a 5 and her hopelessness at an 8. Denies SI and HI. States  she is in a lot of pain due to her back but is unable to take motrin. Can only take tylenol, which Pt states, "is not cutting it". States "I am happy to be here, to change some of the things I have been doing". Pt refused her vitamin pills today stating "I tasted them all day long and I am not doing that again. Also refused her HCTZ stating that her doctor took her off of them before coming into the hospital. A) Given support and reassurance. Encouragement given along with a 1:1. Encouraging Pt to do her booklets so that she can begin looking at herself, her issues, and how she handles all these things. R) Pt states she is feeling better overall, and wants to continue to work on her issues.

## 2012-06-11 NOTE — Progress Notes (Signed)
Patient did attend the evening speaker AA meeting.  

## 2012-06-11 NOTE — Progress Notes (Signed)
D) Pt has been sitting in the dayroom and will be engaged if there is sports on the TV or they are playing a game. When in a group, will attend but then will fall asleep. If he does speak up his statements are limited with little insight into the group and where they are at or into the seriousness of his situation. Wants to play rather than work on his issues. Pt rates his depresion at a 7 and his hopelessness at a 5. A) Encouraged Pt to attend to the group and to all the Patients in the group. Given praise when appropriate.  R) Is limited in his understanding.

## 2012-06-11 NOTE — Progress Notes (Signed)
Psychoeducational Group Note  Date:  06/11/2012 Time:  1015  Group Topic/Focus:  Making Healthy Choices:   The focus of this group is to help patients identify negative/unhealthy choices they were using prior to admission and identify positive/healthier coping strategies to replace them upon discharge.  Participation Level:  Minimal  Participation Quality:  Drowsy  Affect:  Blunted  Cognitive:  Lacking  Insight:  Limited  Engagement in Group:  Poor  Additional Comments:  Pt fell asleep and slept through most of the group.  Dione Housekeeper 06/11/2012

## 2012-06-11 NOTE — Clinical Social Work Psychosocial (Signed)
[facility] Group Notes:  (Clinical Social Work)  06/11/2012  10:00-11:00AM  Summary of Progress/Problems:   The main focus of today's process group was for the patient to define "support" and describe what healthy supports are, then to identify the patient's current support system and decide on other supports that can be put in place to prevent future hospitalizations.   An emphasis was placed on using therapist, doctor and problem-specific support groups to expand supports.  The patient expressed that he believes tough love is very useful for addicts, that it has been used on him and he is grateful.  He is also thankful his mother and sister continue to visit him here at the hospital.  He also discussed "good" pride versus "bad" pride.  Type of Therapy:  Process Group  Participation Level:  Active  Participation Quality:  Appropriate, Attentive, Sharing and Supportive  Affect:  Appropriate and Blunted  Cognitive:  Alert, Appropriate and Oriented  Insight:  Engaged  Engagement in Therapy:  Engaged  Modes of Intervention:  Clarification, Education, Limit-setting, Problem-solving, Socialization, Support and Processing, Exploration, Discussion, Role-Play   Ambrose Mantle, LCSW 06/11/2012, 12:20 PM

## 2012-06-11 NOTE — Progress Notes (Signed)
Patient ID: Brent Gay, male   DOB: 10/28/1965, 46 y.o.   MRN: [id number] Patient ID: Brent Gay, male   DOB: 15-Mar-1966, 46 y.o.   MRN: [id number] [facility] MD Progress Note  06/11/2012 3:01 PM Brent Gay  MRN:  [id number]  Subjective: "I feel good today. I still have some shakes from time to time. My mood is good"  Diagnosis:  Alcohol Dependence, Cocaine dependence, Anxiety Disorder NOS, Depressive Disorder NOS  ADL's:  Intact  Sleep: Good  Appetite:  Fair  Suicidal Ideation: "No" Plan:  Denies Intent:  Denies Means:  Denies  Homicidal Ideation: "no" Plan:  Denies Intent:  Denies Means:  Denies  AEB (as evidenced by):  Participating in group, tolerating medications well with out side effects.  Patient plans to go to [facility] after D/Ca and unsure if will need another program afterwards also interested in [place].    Psychiatric Specialty Exam: Review of Systems  Constitutional: Negative.   HENT: Negative.   Eyes: Negative.   Respiratory: Negative.   Cardiovascular: Negative.   Gastrointestinal: Negative.   Genitourinary: Negative.   Musculoskeletal: Negative.   Skin: Negative.   Neurological: Negative.   Endo/Heme/Allergies: Negative.   Psychiatric/Behavioral: Positive for depression (Currently being stabilized with medication.). Negative for suicidal ideas, hallucinations and memory loss. Substance abuse: Hx. of. The patient is nervous/anxious (Currently being stabilized with medications.) and has insomnia ([place] receiving sleep aid).   All other systems reviewed and are negative.    Blood pressure 117/75, pulse 100, temperature 99.4 F (37.4 C), temperature source Oral, resp. rate 20, height 5' 7.5" (1.715 m), weight 94.802 kg (209 lb), SpO2 98.00%.Body mass index is 32.25 kg/(m^2).  General Appearance: Fairly Groomed  Patent attorney::  Fair  Speech:  Clear and coherent  Volume:  Normal  Mood:  "I feel good"  Affect:  Appropriate  Thought Process:  Coherent and  Goal Directed  Orientation:  Full (Time, Place, and Person)  Thought Content:  Rumination  Suicidal Thoughts:  No  Homicidal Thoughts:  No  Memory:  Immediate;   Fair Recent;   Fair Remote;   Fair  Judgement:  Fair  Insight:  Present  Psychomotor Activity:  Normal  Concentration:  Fair  Recall:  Fair  Akathisia:  No  Handed:  Right  AIMS (if indicated):     Assets:  Desire for Improvement  Sleep:  Number of Hours: 6    Current Medications: Current Facility-Administered Medications  Medication Dose Route Frequency Provider Last Rate Last Dose  . acetaminophen (TYLENOL) tablet 650 mg  650 mg Oral Q6H PRN Shuvon Rankin, NP   650 mg at 06/04/12 2057  . alum & mag hydroxide-simeth (MAALOX/MYLANTA) 200-200-20 MG/5ML suspension 30 mL  30 mL Oral Q4H PRN Shuvon Rankin, NP   30 mL at 06/10/12 1613  . chlordiazePOXIDE (LIBRIUM) capsule 25 mg  25 mg Oral TID PRN Rachael Fee, MD   25 mg at 06/11/12 0820  . citalopram (CELEXA) tablet 20 mg  20 mg Oral Daily Rachael Fee, MD   20 mg at 06/11/12 0816  . gabapentin (NEURONTIN) capsule 600 mg  600 mg Oral TID Rachael Fee, MD   600 mg at 06/11/12 1149  . hydrOXYzine (ATARAX/VISTARIL) tablet 25 mg  25 mg Oral Q6H PRN Rachael Fee, MD   25 mg at 06/11/12 0820  . ibuprofen (ADVIL,MOTRIN) tablet 400 mg  400 mg Oral Q6H PRN Sanjuana Kava, NP   400 mg at  06/10/12 1727  . lisinopril (PRINIVIL,ZESTRIL) tablet 10 mg  10 mg Oral Daily Sanjuana Kava, NP   10 mg at 06/11/12 0816  . magnesium hydroxide (MILK OF MAGNESIA) suspension 30 mL  30 mL Oral Daily PRN Shuvon Rankin, NP      . menthol-cetylpyridinium (CEPACOL) lozenge 3 mg  1 lozenge Oral PRN Sanjuana Kava, NP   3 mg at 06/04/12 1427  . multivitamin with minerals tablet 1 tablet  1 tablet Oral Daily Shuvon Rankin, NP   1 tablet at 06/11/12 0816  . naproxen (NAPROSYN) tablet 500 mg  500 mg Oral BID PRN Nehemiah Settle, MD   500 mg at 06/11/12 1117  . nicotine (NICODERM CQ - dosed in mg/24  hours) patch 21 mg  21 mg Transdermal Daily Shuvon Rankin, NP   21 mg at 06/11/12 0815  . ondansetron (ZOFRAN-ODT) disintegrating tablet 4 mg  4 mg Oral Q8H PRN Rachael Fee, MD   4 mg at 06/10/12 1442  . thiamine (VITAMIN B-1) tablet 100 mg  100 mg Oral Daily Shuvon Rankin, NP   100 mg at 06/11/12 0816  . traZODone (DESYREL) tablet 50 mg  50 mg Oral QHS PRN,MR X 1 Rachael Fee, MD   50 mg at 06/10/12 2308    Lab Results: No results found for this or any previous visit (from the past 48 hour(s)).  Physical Findings: AIMS: Facial and Oral Movements Muscles of Facial Expression: None, normal Lips and Perioral Area: None, normal Jaw: None, normal Tongue: None, normal,Extremity Movements Upper (arms, wrists, hands, fingers): None, normal Lower (legs, knees, ankles, toes): None, normal, Trunk Movements Neck, shoulders, hips: None, normal, Overall Severity Severity of abnormal movements (highest score from questions above): None, normal Incapacitation due to abnormal movements: None, normal Patient's awareness of abnormal movements (rate only patient's report): No Awareness, Dental Status Current problems with teeth and/or dentures?: No Does patient usually wear dentures?: No  CIWA:  CIWA-Ar Total: 4  COWS:     Treatment Plan Summary: Daily contact with patient to assess and evaluate symptoms and progress in treatment Medication management  Plan: Supportive approach/coping skills/relapse prevention. No new changes made on the current treatment plan. Continue current plan of care.            Medical Decision Making Problem Points:Established problem, stable/improving (1), Review of last therapy session (1) and Review of psycho-social stressors (1)   discuss psychosocial stressors, work on ways of dealing with the worry, the anxiety Data Points:  Review of medication regiment & side effects (2), Review or order clinical lab tests (1)   I certify that inpatient services furnished can  reasonably be expected to improve the patient's condition.   [name] I 06/11/2012, 3:01 PM

## 2012-06-11 NOTE — Progress Notes (Signed)
GOALS GROUP This group is about being able to set a goal or be able to state what you are working on or grateful for. An Attitude of Gratitude.  Today Pt attended, participated in group interaction and was spontaneous with thoughts and suggestions for the rest of the group.  

## 2012-06-11 NOTE — Progress Notes (Signed)
D.  Pt pleasant on approach, dozing in dayroom.  Denies complaints at this time.  Positive for evening AA group.   Interacting appropriately within milieu.  Denies SI/HI/hallucinations at this time.  A.  Support and encouragement offered  R.  No distress noted, will continue to monitor.

## 2012-06-12 NOTE — Clinical Social Work Note (Signed)
[facility] LCSW Group Therapy  06/12/2012  1:15 PM   Type of Therapy:  Group Therapy  Participation Level:  Minimal  Participation Quality:  Appropriate  Affect:  Drowsy  Cognitive:  Alert and Appropriate  Insight:  Developing/Improving  Engagement in Therapy:  Developing/Improving  Modes of Intervention:  Clarification, Confrontation, Discussion, Education, Exploration, Limit-setting, Problem-solving, Rapport Building, Socialization and Support  Summary of Progress/Problems: The topic for group today was overcoming obstacles.  Pt discussed overcoming obstacles and what this means for pt. Pt slept through a majority of group.  Pt did share that his biggest obstacle to overcome is losing the taste for alcohol.    Brent Gay, LCSWA 06/12/2012 2:24 PM

## 2012-06-12 NOTE — Progress Notes (Signed)
Patient ID: Brent Gay, male   DOB: May 23, 1966, 46 y.o.   MRN: [id number] He has been up and to groups interacting with peers and staff. Has requested prn medication for pain and anxiety/w/ds once today. He was agitated this AM but has been calm. Self inventory:   Depression 8, hopelessness 5, reported several w/d symptoms but has not requested any  PRNs  Except early this AM. Denies SI thoughts and reports several physical discomforts this AM and received prn pain medication that was effective.

## 2012-06-12 NOTE — Progress Notes (Signed)
Patient ID: RAEQUON CATANZARO, male   DOB: [birth year], 46 y.o.   MRN: [id number] Patient ID: BO TEICHER, male   DOB: 26-Dec-1965, 46 y.o.   MRN: [id number] Patient ID: GARALD RHEW, male   DOB: [birth year], 46 y.o.   MRN: [id number] [facility] MD Progress Note  06/12/2012 12:16 PM BRACEN SCHUM  MRN:  [id number]  Subjective: "I feel so, so today. A lot of stuff going on today on the unit. Some guy that won't take a bath, but won't take a friendly gesture to do so. His attitude got me upset. But I will be okay eventually. I slept well last night. Preparing myself to go to [facility] on the second".  Diagnosis:  Alcohol Dependence, Cocaine dependence, Anxiety Disorder NOS, Depressive Disorder NOS  ADL's:  Intact  Sleep: Good  Appetite:  Fair  Suicidal Ideation: "No" Plan:  Denies Intent:  Denies Means:  Denies  Homicidal Ideation: "no" Plan:  Denies Intent:  Denies Means:  Denies  AEB (as evidenced by):  Per patient's reports. Psychiatric Specialty Exam: Review of Systems  Constitutional: Negative.   HENT: Negative.   Eyes: Negative.   Respiratory: Negative.   Cardiovascular: Negative.   Gastrointestinal: Negative.   Genitourinary: Negative.   Musculoskeletal: Negative.   Skin: Negative.   Neurological: Negative.   Endo/Heme/Allergies: Negative.   Psychiatric/Behavioral: Positive for depression (Currently being stabilized with medication.). Negative for suicidal ideas, hallucinations and memory loss. Substance abuse: Hx. of. The patient is nervous/anxious (Currently being stabilized with medications.) and has insomnia ([place] receiving sleep aid).   All other systems reviewed and are negative.    Blood pressure 117/75, pulse 100, temperature 99.4 F (37.4 C), temperature source Oral, resp. rate 20, height 5' 7.5" (1.715 m), weight 94.802 kg (209 lb), SpO2 98.00%.Body mass index is 32.25 kg/(m^2).  General Appearance: Fairly Groomed  Patent attorney::  Fair  Speech:  Clear  and coherent  Volume:  Normal  Mood:  "I feel good"  Affect:  Appropriate  Thought Process:  Coherent and Goal Directed  Orientation:  Full (Time, Place, and Person)  Thought Content:  Rumination  Suicidal Thoughts:  No  Homicidal Thoughts:  No  Memory:  Immediate;   Fair Recent;   Fair Remote;   Fair  Judgement:  Fair  Insight:  Present  Psychomotor Activity:  Normal  Concentration:  Fair  Recall:  Fair  Akathisia:  No  Handed:  Right  AIMS (if indicated):     Assets:  Desire for Improvement  Sleep:  Number of Hours: 6.75    Current Medications: Current Facility-Administered Medications  Medication Dose Route Frequency Provider Last Rate Last Dose  . acetaminophen (TYLENOL) tablet 650 mg  650 mg Oral Q6H PRN Shuvon Rankin, NP   650 mg at 06/04/12 2057  . alum & mag hydroxide-simeth (MAALOX/MYLANTA) 200-200-20 MG/5ML suspension 30 mL  30 mL Oral Q4H PRN Shuvon Rankin, NP   30 mL at 06/10/12 1613  . chlordiazePOXIDE (LIBRIUM) capsule 25 mg  25 mg Oral TID PRN Rachael Fee, MD   25 mg at 06/11/12 1716  . citalopram (CELEXA) tablet 20 mg  20 mg Oral Daily Rachael Fee, MD   20 mg at 06/12/12 [phone]  . gabapentin (NEURONTIN) capsule 600 mg  600 mg Oral TID Rachael Fee, MD   600 mg at 06/12/12 1143  . hydrOXYzine (ATARAX/VISTARIL) tablet 25 mg  25 mg Oral Q6H PRN Rachael Fee, MD   25 mg  at 06/12/12 0900  . ibuprofen (ADVIL,MOTRIN) tablet 400 mg  400 mg Oral Q6H PRN Sanjuana Kava, NP   400 mg at 06/10/12 1727  . lisinopril (PRINIVIL,ZESTRIL) tablet 10 mg  10 mg Oral Daily Sanjuana Kava, NP   10 mg at 06/12/12 0853  . magnesium hydroxide (MILK OF MAGNESIA) suspension 30 mL  30 mL Oral Daily PRN Shuvon Rankin, NP      . menthol-cetylpyridinium (CEPACOL) lozenge 3 mg  1 lozenge Oral PRN Sanjuana Kava, NP   3 mg at 06/04/12 1427  . multivitamin with minerals tablet 1 tablet  1 tablet Oral Daily Shuvon Rankin, NP   1 tablet at 06/12/12 0854  . naproxen (NAPROSYN) tablet 500 mg  500 mg  Oral BID PRN Nehemiah Settle, MD   500 mg at 06/12/12 0900  . nicotine (NICODERM CQ - dosed in mg/24 hours) patch 21 mg  21 mg Transdermal Daily Shuvon Rankin, NP   21 mg at 06/12/12 0854  . ondansetron (ZOFRAN-ODT) disintegrating tablet 4 mg  4 mg Oral Q8H PRN Rachael Fee, MD   4 mg at 06/10/12 1442  . thiamine (VITAMIN B-1) tablet 100 mg  100 mg Oral Daily Shuvon Rankin, NP   100 mg at 06/12/12 0854  . traZODone (DESYREL) tablet 50 mg  50 mg Oral QHS PRN,MR X 1 Rachael Fee, MD   50 mg at 06/10/12 2308    Lab Results: No results found for this or any previous visit (from the past 48 hour(s)).  Physical Findings: AIMS: Facial and Oral Movements Muscles of Facial Expression: None, normal Lips and Perioral Area: None, normal Jaw: None, normal Tongue: None, normal,Extremity Movements Upper (arms, wrists, hands, fingers): None, normal Lower (legs, knees, ankles, toes): None, normal, Trunk Movements Neck, shoulders, hips: None, normal, Overall Severity Severity of abnormal movements (highest score from questions above): None, normal Incapacitation due to abnormal movements: None, normal Patient's awareness of abnormal movements (rate only patient's report): No Awareness, Dental Status Current problems with teeth and/or dentures?: No Does patient usually wear dentures?: No  CIWA:  CIWA-Ar Total: 4  COWS:     Treatment Plan Summary: Daily contact with patient to assess and evaluate symptoms and progress in treatment Medication management  Plan: Supportive approach/coping skills/relapse prevention. No new changes made on the current treatment plan. Possible discharge on 06/15/12 to [facility]. Continue current plan of care.            Medical Decision Making Problem Points:Established problem, stable/improving (1), Review of last therapy session (1) and Review of psycho-social stressors (1)   discuss psychosocial stressors, work on ways of dealing with the worry, the  anxiety Data Points:  Review of medication regiment & side effects (2), Review or order clinical lab tests (1)   I certify that inpatient services furnished can reasonably be expected to improve the patient's condition.   [name] I 06/12/2012, 12:16 PM

## 2012-06-12 NOTE — Clinical Social Work Note (Signed)
[facility] LCSW Aftercare Discharge Planning Group Note  06/12/2012 8:45 AM  Participation Quality:  Appropriate and Attentive  Affect:  Appropriate  Cognitive:  Alert and Appropriate  Insight:  Developing/Improving  Engagement in Group:  Developing/Improving  Modes of Intervention:  Clarification, Discussion, Education, Exploration, Orientation, Problem-solving, Rapport Building, Socialization and Support  Summary of Progress/Problems: Pt attended discharge planning group and actively participated in group.  CSW provided pt with today's workbook.  Pt presents with calm mood and affect.  Pt rates depression at a 5 and anxiety at a 9 today.  Pt denies SI/HI.  Pt will follow up at [facility] on 1/2 for further treatment.  Pt will use the bus to get there; bus pass provided in chart.  Pt often had side conversations and had to be redirected.  No further needs voiced by pt at this time.    Reyes Ivan, LCSWA 06/12/2012 10:36 AM

## 2012-06-12 NOTE — Tx Team (Signed)
Interdisciplinary Treatment Plan Update (Adult)  Date:  06/12/2012  Time Reviewed:  9:48 AM   Progress in Treatment: Attending groups: Yes Participating in groups:  Yes Taking medication as prescribed: Yes Tolerating medication:  Yes Family/Significant othe contact made:  N/A Patient understands diagnosis:  Yes Discussing patient identified problems/goals with staff:  Yes Medical problems stabilized or resolved:  Yes Denies suicidal/homicidal ideation: Yes Issues/concerns per patient self-inventory:  None identified Other: N/A  New problem(s) identified: None Identified  Reason for Continuation of Hospitalization: Anxiety Depression Medication stabilization  Interventions implemented related to continuation of hospitalization: mood stabilization, medication monitoring and adjustment, group therapy and psycho education, suicide risk assessment, collateral contact, aftercare planning, ongoing physician assessments and safety checks q 15 mins  Additional comments: N/A  Estimated length of stay: 2 days - d/c on 1/2  Discharge Plan: Pt is scheduled to go to [facility] on 1/2 for further treatment.    New goal(s): N/A  Review of initial/current patient goals per problem list:    1.  Goal(s): Address substance use by completing detox protocol  Met:  No  Target date: 4 days  As evidenced by: attending groups and actively participated   2.  Goal (s): Reduce depressive symptoms from a 10 to a 3  Met:  No  Target date: 3-5 days  As evidenced by: Pt rates at a 5 today.    3.  Goal (s): Reduce anxiety symptoms from a 10 to a 3  Met:  No  Target date:  3-5 days  As evidenced by: Pt rates at a 9 today.       Attendees: Patient:     Family:     Physician: Geoffery Lyons, MD 06/12/2012 9:48 AM   Nursing: Robbie Louis, RN 06/12/2012 9:48 AM   Clinical Social Worker:  Reyes Ivan, LCSWA 06/12/2012  9:48 AM   Other: Roswell Miners, RN 06/12/2012  9:48 AM   Other:   Serena Colonel, NP 06/12/2012 9:50 AM   Other:  Verna Czech, LCSW 06/12/2012 9:50 AM   Other:     Other:      Scribe for Treatment Team:   Reyes Ivan 06/12/2012 9:48 AM

## 2012-06-12 NOTE — Progress Notes (Signed)
Psychoeducational Group Note  Date:  06/12/2012 Time:  1100  Group Topic/Focus:  Self Care:   The focus of this group is to help patients understand the importance of self-care in order to improve or restore emotional, physical, spiritual, interpersonal, and financial health.  Participation Level:  Active  Participation Quality:  Appropriate, Sharing and Supportive  Affect:  Appropriate  Cognitive:  Appropriate  Insight:  Supportive  Engagement in Group:  Supportive  Additional Comments:  none  Reinette Cuneo M 06/12/2012, 2:37 PM

## 2012-06-13 MED ORDER — LOPERAMIDE HCL 2 MG PO CAPS
2.0000 mg | ORAL_CAPSULE | ORAL | Status: DC | PRN
  Administered 2012-06-13: 4 mg via ORAL
  Administered 2012-06-14 (×3): 2 mg via ORAL

## 2012-06-13 MED ORDER — CHLORDIAZEPOXIDE HCL 25 MG PO CAPS
25.0000 mg | ORAL_CAPSULE | Freq: Two times a day (BID) | ORAL | Status: DC | PRN
  Administered 2012-06-13 – 2012-06-14 (×3): 25 mg via ORAL
  Filled 2012-06-13 (×3): qty 1

## 2012-06-13 NOTE — Clinical Social Work Note (Signed)
[facility] LCSW Aftercare Discharge Planning Group Note  06/13/2012 8:45 AM  Participation Quality:  Appropriate and Attentive  Affect:  Appropriate  Cognitive:  Alert and Appropriate  Insight:  Developing/Improving  Engagement in Group:  Developing/Improving  Modes of Intervention:  Clarification, Discussion, Education, Exploration, Orientation, Problem-solving, Rapport Building, Socialization and Support  Summary of Progress/Problems: Pt attended discharge planning group and actively participated in group.  CSW provided pt with today's workbook.  Pt presents with calm mood and affect.  Pt rates depression at a 6 and anxiety at a 9 today.  Pt denies SI/HI.  Pt is scheduled to go to [facility] on 1/2 and will take the bus.  No further needs voiced by pt at this time.  Safety planning and suicide prevention discussed.  Pt participated in discussion and acknowledged an understanding of the information provided.       Brent Gay, LCSWA 06/13/2012 10:57 AM

## 2012-06-13 NOTE — Progress Notes (Signed)
Psychoeducational Group Note  Date:  06/13/2012 Time: 1100  Group Topic/Focus:  Recovery Goals:   The focus of this group is to identify appropriate goals for recovery and establish a plan to achieve them.  Participation Level:  Did Not Attend   Brent Gay 06/13/2012, 2:15 PM 

## 2012-06-13 NOTE — Progress Notes (Signed)
Pt has been socializing with peers all evening.  He attended evening AA group.  At bedtime, pt came to the medication window and asked for Librium, Vistaril, and Trazodone.  He carries a piece of paper with his prn medications listed, and is argumentative with nurses if he is encouraged to decrease his dependence on these meds.  He has been here since 12/20 and shows no signs of withdrawals.  His discharge plan is to go to [facility] on Thursday.  He denies SI/HI/AV.  Pt makes his needs known to staff and is loud and boisterous on the unit.  Pt is redirectable.  Support/encouragement given.  Safety maintained with q15 minute checks.

## 2012-06-13 NOTE — Progress Notes (Signed)
Psychoeducational Group Note  Date:  06/13/2012 Time:  2000   Group Topic/Focus:  AA  Participation Level:  Minimal  Participation Quality:  Inattentive  Affect:  Flat  Cognitive:  Lacking  Insight:  Limited  Engagement in Group:  Limited  Additional Comments:   Humberto Seals Monique 06/13/2012, 10:10 PM

## 2012-06-13 NOTE — Clinical Social Work Note (Signed)
[facility] LCSW Group Therapy  06/13/2012  1:15 PM   Type of Therapy:  Group Therapy  Participation Level:  Active  Participation Quality:  Appropriate and Attentive  Affect:  Appropriate  Cognitive:  Alert and Appropriate  Insight:  Developing/Improving  Engagement in Therapy:  Developing/Improving  Modes of Intervention:  Clarification, Discussion, Education, Exploration, Problem-solving, Rapport Building, Socialization and Support  Summary of Progress/Problems: The topic for group therapy was feelings about diagnosis.  Pt actively participated in group discussion on their past and current diagnosis and how they feel towards this.  Pt also identified how society and family members judge them, based on their diagnosis as well as stereotypes and stigmas.  Pt shared that his diagnosis is alcohol abuse and agrees with this diagnosis. Pt states that he hates it and plans to overcome it by having a healthy lifestyle.    Reyes Ivan, [place] 06/13/2012 2:56 PM

## 2012-06-13 NOTE — Progress Notes (Signed)
Patient ID: Brent Gay, male   DOB: [birth year], 46 y.o.   MRN: [id number] Patient ID: Brent Gay, male   DOB: 11/21/1965, 46 y.o.   MRN: [id number] Patient ID: Brent Gay, male   DOB: 12/28/1965, 46 y.o.   MRN: [id number] Patient ID: Brent Gay, male   DOB: February 17, 1966, 46 y.o.   MRN: [id number] [facility] MD Progress Note  06/13/2012 12:31 PM Brent Gay  MRN:  [id number]  Subjective: "I'm doing very well. Preparing myself into getting into rehab on 06/15/12. I have 3 things lined in my mind to help me through this. I will concentrate on my recovery, line-up structural housing for me and  Planning on vocational rehab afterwards so that I can get a job to keep my mind busy".  Diagnosis:  Alcohol Dependence, Cocaine dependence, Anxiety Disorder NOS, Depressive Disorder NOS  ADL's:  Intact  Sleep: Good  Appetite:  Fair  Suicidal Ideation: "No" Plan:  Denies Intent:  Denies Means:  Denies  Homicidal Ideation: "no" Plan:  Denies Intent:  Denies Means:  Denies  AEB (as evidenced by):  Per patient's reports. Psychiatric Specialty Exam: Review of Systems  Constitutional: Negative.   HENT: Negative.   Eyes: Negative.   Respiratory: Negative.   Cardiovascular: Negative.   Gastrointestinal: Negative.   Genitourinary: Negative.   Musculoskeletal: Negative.   Skin: Negative.   Neurological: Negative.   Endo/Heme/Allergies: Negative.   Psychiatric/Behavioral: Positive for depression (Currently being stabilized with medication.). Negative for suicidal ideas, hallucinations and memory loss. Substance abuse: Hx. of. The patient is nervous/anxious (Currently being stabilized with medications.) and has insomnia ([place] receiving sleep aid).   All other systems reviewed and are negative.    Blood pressure 116/84, pulse 96, temperature 97.9 F (36.6 C), temperature source Oral, resp. rate 16, height 5' 7.5" (1.715 m), weight 94.802 kg (209 lb), SpO2 98.00%.Body mass index is 32.25  kg/(m^2).  General Appearance: Fairly Groomed  Patent attorney::  Fair  Speech:  Clear and coherent  Volume:  Normal  Mood:  "I feel good"  Affect:  Appropriate  Thought Process:  Coherent and Goal Directed  Orientation:  Full (Time, Place, and Person)  Thought Content:  Rumination  Suicidal Thoughts:  No  Homicidal Thoughts:  No  Memory:  Immediate;   Fair Recent;   Fair Remote;   Fair  Judgement:  Fair  Insight:  Present  Psychomotor Activity:  Normal  Concentration:  Fair  Recall:  Fair  Akathisia:  No  Handed:  Right  AIMS (if indicated):     Assets:  Desire for Improvement  Sleep:  Number of Hours: 5.75    Current Medications: Current Facility-Administered Medications  Medication Dose Route Frequency Provider Last Rate Last Dose  . acetaminophen (TYLENOL) tablet 650 mg  650 mg Oral Q6H PRN Shuvon Rankin, NP   650 mg at 06/04/12 2057  . alum & mag hydroxide-simeth (MAALOX/MYLANTA) 200-200-20 MG/5ML suspension 30 mL  30 mL Oral Q4H PRN Shuvon Rankin, NP   30 mL at 06/13/12 1144  . chlordiazePOXIDE (LIBRIUM) capsule 25 mg  25 mg Oral TID PRN Rachael Fee, MD   25 mg at 06/13/12 0347  . citalopram (CELEXA) tablet 20 mg  20 mg Oral Daily Rachael Fee, MD   20 mg at 06/13/12 4259  . gabapentin (NEURONTIN) capsule 600 mg  600 mg Oral TID Rachael Fee, MD   600 mg at 06/13/12 1144  . hydrOXYzine (ATARAX/VISTARIL) tablet 25 mg  25 mg Oral Q6H PRN Rachael Fee, MD   25 mg at 06/13/12 0654  . ibuprofen (ADVIL,MOTRIN) tablet 400 mg  400 mg Oral Q6H PRN Sanjuana Kava, NP   400 mg at 06/10/12 1727  . lisinopril (PRINIVIL,ZESTRIL) tablet 10 mg  10 mg Oral Daily Sanjuana Kava, NP   10 mg at 06/13/12 0824  . magnesium hydroxide (MILK OF MAGNESIA) suspension 30 mL  30 mL Oral Daily PRN Shuvon Rankin, NP      . menthol-cetylpyridinium (CEPACOL) lozenge 3 mg  1 lozenge Oral PRN Sanjuana Kava, NP   3 mg at 06/04/12 1427  . multivitamin with minerals tablet 1 tablet  1 tablet Oral Daily Shuvon  Rankin, NP   1 tablet at 06/13/12 0824  . naproxen (NAPROSYN) tablet 500 mg  500 mg Oral BID PRN Nehemiah Settle, MD   500 mg at 06/12/12 1710  . nicotine (NICODERM CQ - dosed in mg/24 hours) patch 21 mg  21 mg Transdermal Daily Shuvon Rankin, NP   21 mg at 06/13/12 0825  . ondansetron (ZOFRAN-ODT) disintegrating tablet 4 mg  4 mg Oral Q8H PRN Rachael Fee, MD   4 mg at 06/13/12 1144  . thiamine (VITAMIN B-1) tablet 100 mg  100 mg Oral Daily Shuvon Rankin, NP   100 mg at 06/13/12 0824  . traZODone (DESYREL) tablet 50 mg  50 mg Oral QHS PRN,MR X 1 Rachael Fee, MD   50 mg at 06/12/12 2315    Lab Results: No results found for this or any previous visit (from the past 48 hour(s)).  Physical Findings: AIMS: Facial and Oral Movements Muscles of Facial Expression: None, normal Lips and Perioral Area: None, normal Jaw: None, normal Tongue: None, normal,Extremity Movements Upper (arms, wrists, hands, fingers): None, normal Lower (legs, knees, ankles, toes): None, normal, Trunk Movements Neck, shoulders, hips: None, normal, Overall Severity Severity of abnormal movements (highest score from questions above): None, normal Incapacitation due to abnormal movements: None, normal Patient's awareness of abnormal movements (rate only patient's report): No Awareness, Dental Status Current problems with teeth and/or dentures?: No Does patient usually wear dentures?: No  CIWA:  CIWA-Ar Total: 4  COWS:     Treatment Plan Summary: Daily contact with patient to assess and evaluate symptoms and progress in treatment Medication management  Plan: Supportive approach/coping skills/relapse prevention. No new changes made on the current treatment plan. Probable discharge on 06/15/12 to [facility]. Continue current plan of care.            Medical Decision Making Problem Points:Established problem, stable/improving (1), Review of last therapy session (1) and Review of psycho-social  stressors (1)   discuss psychosocial stressors, work on ways of dealing with the worry, the anxiety Data Points:  Review of medication regiment & side effects (2), Review or order clinical lab tests (1)   I certify that inpatient services furnished can reasonably be expected to improve the patient's condition.   [name] I 06/13/2012, 12:31 PM

## 2012-06-13 NOTE — Progress Notes (Signed)
Patient ID: Brent Gay, male   DOB: Feb 09, 1966, 46 y.o.   MRN: [id number] He has been up and about and to group interacting with peers and staff. Requested and received PRN for anxiety. Has not returned self inventory today. He continues to c/o withdrawal symptoms tremors and anxiety. Dr. Is aware of this.

## 2012-06-14 MED ORDER — CITALOPRAM HYDROBROMIDE 20 MG PO TABS: 20.0000 mg | ORAL_TABLET | Freq: Every day | ORAL | Status: DC

## 2012-06-14 MED ORDER — HYDROXYZINE HCL 25 MG PO TABS: 25.0000 mg | ORAL_TABLET | Freq: Four times a day (QID) | ORAL | Status: DC | PRN

## 2012-06-14 MED ORDER — GABAPENTIN 600 MG PO TABS
600.0000 mg | ORAL_TABLET | Freq: Three times a day (TID) | ORAL | Status: DC
  Filled 2012-06-14: qty 42

## 2012-06-14 MED ORDER — LISINOPRIL 10 MG PO TABS: 10.0000 mg | ORAL_TABLET | Freq: Every day | ORAL | Status: DC

## 2012-06-14 MED ORDER — GABAPENTIN 300 MG PO CAPS: 600.0000 mg | ORAL_CAPSULE | Freq: Three times a day (TID) | ORAL | Status: DC

## 2012-06-14 MED ORDER — TRAZODONE HCL 50 MG PO TABS: 50.0000 mg | ORAL_TABLET | Freq: Every evening | ORAL | Status: DC | PRN

## 2012-06-14 MED ORDER — ADULT MULTIVITAMIN W/MINERALS CH: 1.0000 | ORAL_TABLET | Freq: Every day | ORAL | Status: DC

## 2012-06-14 NOTE — [facility] Suicide Risk Assessment (Signed)
Suicide Risk Assessment  Discharge Assessment     Demographic Factors:  Male and Caucasian  Mental Status Per Nursing Assessment::   On Admission:  NA  Current Mental Status by Physician: In full contact with reality. There are no suicidal ideas, plans or intent. His mood is euthymic, his affect is appropriate. He is committed to his recovery. Will be admitted to [facility] 06/16/2011   Loss Factors: Decrease in vocational status and NA  Historical Factors: NA  Risk Reduction Factors:   Sense of responsibility to family  Continued Clinical Symptoms:  Severe Anxiety and/or Agitation Depression:   Comorbid alcohol abuse/dependence Alcohol/Substance Abuse/Dependencies  Cognitive Features That Contribute To Risk:  Closed-mindedness Thought constriction (tunnel vision)    Suicide Risk:  Minimal: No identifiable suicidal ideation.  Patients presenting with no risk factors but with morbid ruminations; may be classified as minimal risk based on the severity of the depressive symptoms  Discharge Diagnoses:   AXIS I:  Alcohol, Cocaine Dependence, Anxiety Disorder NOS, Depressive disorder NOS AXIS II:  Deferred AXIS III:   Past Medical History  Diagnosis Date  . Hypertension   . Chronic back pain   . Alcohol abuse   . GERD (gastroesophageal reflux disease)   . Gastritis   . Atypical chest pain   . Chronic pain   . Alcoholic liver disease    AXIS IV:  economic problems and occupational problems AXIS V:  61-70 mild symptoms  Plan Of Care/Follow-up recommendations:  Activity:  AS tolerated Diet:  Regular [facility] Is patient on multiple antipsychotic therapies at discharge:  No   Has Patient had three or more failed trials of antipsychotic monotherapy by history:  No  Recommended Plan for Multiple Antipsychotic Therapies: N/C   Aynsley Fleet A 06/14/2012, 3:09 PM

## 2012-06-14 NOTE — Progress Notes (Signed)
Pt reports he is still anxious and still asks for prn medications often.  He denies SI/HI/AV and says he is supposed to discharge to [facility] on Thursday morning.  He attends most groups.  Support/encouragement given.  Safety maintained with q15 minute checks.

## 2012-06-14 NOTE — Progress Notes (Signed)
[facility] MD Progress Note  06/14/2012 3:15 PM Brent Gay  MRN:  [id number] Subjective:  Will be going to [facility] in the morning. He endorses that he needs to continue to work on his ability to stay abstinent. Wants to pursue further help for his anxiety and depression Diagnosis:   Axis I: Alcohol, Cocaine Dependence, Anxiety Disorder NOS, Depressive Disorder NOS Axis II: Deferred Axis III:  Past Medical History  Diagnosis Date  . Hypertension   . Chronic back pain   . Alcohol abuse   . GERD (gastroesophageal reflux disease)   . Gastritis   . Atypical chest pain   . Chronic pain   . Alcoholic liver disease    Axis IV: housing problems, occupational problems and problems with primary support group Axis V: 61-70 mild symptoms  ADL's:  Intact  Sleep: Fair  Appetite:  Fair  Suicidal Ideation:  Plan:  Denies Intent:  Denies Means:  Denies Homicidal Ideation:  Plan:  Denies Intent:  Denies Means:  Denies AEB (as evidenced by):  Psychiatric Specialty Exam: Review of Systems  Constitutional: Negative.   HENT: Negative.   Eyes: Negative.   Respiratory: Negative.   Cardiovascular: Negative.   Gastrointestinal: Positive for nausea.  Genitourinary: Negative.   Musculoskeletal: Positive for back pain.  Skin: Negative.   Neurological: Negative.   Endo/Heme/Allergies: Negative.   Psychiatric/Behavioral: Positive for substance abuse.    Blood pressure 109/76, pulse 120, temperature 97.7 F (36.5 C), temperature source Oral, resp. rate 16, height 5' 7.5" (1.715 m), weight 94.802 kg (209 lb), SpO2 98.00%.Body mass index is 32.25 kg/(m^2).  General Appearance: Fairly Groomed  Patent attorney::  Fair  Speech:  Clear and Coherent  Volume:  Normal  Mood:  Anxious  Affect:  Appropriate  Thought Process:  Coherent and Goal Directed  Orientation:  Full (Time, Place, and Person)  Thought Content:  WDL  Suicidal Thoughts:  No  Homicidal Thoughts:  No  Memory:  Immediate;    Fair Recent;   Fair Remote;   Fair  Judgement:  Fair  Insight:  superficial  Psychomotor Activity:  Decreased  Concentration:  Fair  Recall:  Fair  Akathisia:  No  Handed:  Right  AIMS (if indicated):     Assets:  Desire for Improvement  Sleep:  Number of Hours: 4.5    Current Medications: Current Facility-Administered Medications  Medication Dose Route Frequency Provider Last Rate Last Dose  . acetaminophen (TYLENOL) tablet 650 mg  650 mg Oral Q6H PRN Shuvon Rankin, NP   650 mg at 06/04/12 2057  . alum & mag hydroxide-simeth (MAALOX/MYLANTA) 200-200-20 MG/5ML suspension 30 mL  30 mL Oral Q4H PRN Shuvon Rankin, NP   30 mL at 06/14/12 0842  . chlordiazePOXIDE (LIBRIUM) capsule 25 mg  25 mg Oral BID PRN Rachael Fee, MD   25 mg at 06/14/12 0846  . citalopram (CELEXA) tablet 20 mg  20 mg Oral Daily Rachael Fee, MD   20 mg at 06/14/12 0844  . gabapentin (NEURONTIN) capsule 600 mg  600 mg Oral TID Rachael Fee, MD   600 mg at 06/14/12 1135  . hydrOXYzine (ATARAX/VISTARIL) tablet 25 mg  25 mg Oral Q6H PRN Rachael Fee, MD   25 mg at 06/14/12 0846  . ibuprofen (ADVIL,MOTRIN) tablet 400 mg  400 mg Oral Q6H PRN Sanjuana Kava, NP   400 mg at 06/10/12 1727  . lisinopril (PRINIVIL,ZESTRIL) tablet 10 mg  10 mg Oral Daily Sanjuana Kava,  NP   10 mg at 06/14/12 0844  . loperamide (IMODIUM) capsule 2 mg  2 mg Oral PRN Verne Spurr, PA-C   2 mg at 06/14/12 1231  . magnesium hydroxide (MILK OF MAGNESIA) suspension 30 mL  30 mL Oral Daily PRN Shuvon Rankin, NP      . menthol-cetylpyridinium (CEPACOL) lozenge 3 mg  1 lozenge Oral PRN Sanjuana Kava, NP   3 mg at 06/04/12 1427  . multivitamin with minerals tablet 1 tablet  1 tablet Oral Daily Shuvon Rankin, NP   1 tablet at 06/14/12 0841  . naproxen (NAPROSYN) tablet 500 mg  500 mg Oral BID PRN Nehemiah Settle, MD   500 mg at 06/14/12 1415  . nicotine (NICODERM CQ - dosed in mg/24 hours) patch 21 mg  21 mg Transdermal Daily Shuvon Rankin, NP    21 mg at 06/14/12 0800  . ondansetron (ZOFRAN-ODT) disintegrating tablet 4 mg  4 mg Oral Q8H PRN Rachael Fee, MD   4 mg at 06/14/12 1133  . thiamine (VITAMIN B-1) tablet 100 mg  100 mg Oral Daily Shuvon Rankin, NP   100 mg at 06/14/12 0800  . traZODone (DESYREL) tablet 50 mg  50 mg Oral QHS PRN,MR X 1 Rachael Fee, MD   50 mg at 06/14/12 0003    Lab Results: No results found for this or any previous visit (from the past 48 hour(s)).  Physical Findings: AIMS: Facial and Oral Movements Muscles of Facial Expression: None, normal Lips and Perioral Area: None, normal Jaw: None, normal Tongue: None, normal,Extremity Movements Upper (arms, wrists, hands, fingers): None, normal Lower (legs, knees, ankles, toes): None, normal, Trunk Movements Neck, shoulders, hips: None, normal, Overall Severity Severity of abnormal movements (highest score from questions above): None, normal Incapacitation due to abnormal movements: None, normal Patient's awareness of abnormal movements (rate only patient's report): No Awareness, Dental Status Current problems with teeth and/or dentures?: No Does patient usually wear dentures?: No  CIWA:  CIWA-Ar Total: 4  COWS:     Treatment Plan Summary: Daily contact with patient to assess and evaluate symptoms and progress in treatment Medication management  Plan: Supportive approach/coping skills/relapse prevention           To be admitted to [facility] in the morning  Medical Decision Making Problem Points:  Established problem, stable/improving (1), Review of last therapy session (1) and Review of psycho-social stressors (1) Data Points:  Review of medication regiment & side effects (2)  I certify that inpatient services furnished can reasonably be expected to improve the patient's condition.   Brent Gay A 06/14/2012, 3:15 PM

## 2012-06-14 NOTE — Progress Notes (Signed)
Patient ID: Brent Gay, male   DOB: July 07, 1965, 47 y.o.   MRN: [id number] He had been c/o diarrhea and vomiting today but he says that he is ok at this time. He did receive prn medications through the day. He did not fill out his self survey today. He has been in bed most of AM and up in day room more this afternoon. He is aware that he is to be discharged in the AM.

## 2012-06-14 NOTE — Progress Notes (Signed)
Pt observed in the dayroom watching TV.  He attended evening NA group tonight.  He is aware he is being discharged in the AM to [facility].  Paperwork is complete, and bus pass is in the shadow chart.  Pt denies SI/HI/AV.  He is still saying he is anxious.  He is still asking for prn meds frequently.  Support/encouragement given.  Pt makes his needs known to staff.  Safety maintained with q15 minute checks.

## 2012-06-14 NOTE — Progress Notes (Signed)
MHT reported that pt was in his bathroom vomiting.  Pt was given Zofran for nausea.  Pt feels his nausea and diarrhea earlier are from food poisoning.  MHT reported that pt was eating a lot of snacks last night before bedtime.  Will continue to monitor.  Safety maintained with q15 minutes.

## 2012-06-14 NOTE — Discharge Summary (Signed)
Physician Discharge Summary Note  Patient:  Brent Gay is an 47 y.o., male MRN:  [id number] DOB:  [birth year] Patient phone:  [phone] (home)  Patient address:   [address] [place] [address],   Date of Admission:  06/02/2012 Date of Discharge: 06/15/12  Reason for Admission: Detoxification treatment for alcohol and cocaine.  Discharge Diagnoses: Principal Problem:  *Polysubstance dependence including opioid type drug, continuous use Active Problems:  Polysubstance abuse  Cocaine abuse  ALCOHOL ABUSE  Alcohol dependence  Alcohol withdrawal  Cocaine dependence  Depressive disorder  Anxiety disorder  Review of Systems  Constitutional: Negative.   HENT: Negative.   Eyes: Negative.   Respiratory: Positive for hemoptysis.   Cardiovascular: Negative.   Gastrointestinal: Negative.   Genitourinary: Negative.   Musculoskeletal: Negative.   Skin: Negative.   Neurological: Negative.   Endo/Heme/Allergies: Negative.   Psychiatric/Behavioral: Positive for depression (Stabilized with medication upon discharge.) and substance abuse (Hx. will continue substance abuse treatment at [facility] residential.). Negative for suicidal ideas and hallucinations. The patient is nervous/anxious (Stabilized with medication upon discharge.) and has insomnia (Stabilized with medication upon discharge.).    Axis Diagnosis:   AXIS I:  Polysubstance dependence including opioid type drug, continuous use AXIS II:  Deferred AXIS III:   Past Medical History  Diagnosis Date  . Hypertension   . Chronic back pain   . Alcohol abuse   . GERD (gastroesophageal reflux disease)   . Gastritis   . Atypical chest pain   . Chronic pain   . Alcoholic liver disease    AXIS IV:  other psychosocial or environmental problems AXIS V:  63  Level of Care:  [facility]  Hospital Course:  This is an admission assessment for this 47 year old Caucasian male. Admitted to [facility] from the [facility] ED with  complaints of cocaine and alcohol abuse requesting detoxification treatment. Patient reports, "I drove myself to the hospital because I was in a lot of pain from alcohol and drug withdrawal symptoms. I was also vomiting a lot. I have been drinking alcohol and using drugs excessively x 1 month. However, I have been abusing drugs off and on x 22 years. I use drugs and alcohol because I feel bored a lot of times. Drugs, alcohol allow me temporary relief from boredom. They lighten me up and I will feel lively again. Drugs has caused me jobs, relationships, legal charges and inability to finish college as well. I have been through drug rehab this year 2013,. I have been to [facility] and [facility] in the past and in this hospital x 2 . I feel very depressed, and will consider taking An antidepressant while I'm in here".   After admission assessment and evaluation, it was determined that patient will need detoxification treatment to rid off excessive alcohol from his system. And his discharge plans included a referral to a long term treatment facility for more intense substance abuse treatment. [name] was then started on Librium protocol for his substance detoxification. He was also enrolled in group counseling sessions and activities to learn coping skills that will him help him after discharge to cope better, manage his substance abuse problems to maintain a much longer sobriety. He also was enrolled and attended AA/NA meetings being offered and held on this unit. He has some previous and or identifiable medical conditions that required treatment and or monitoring. He received medication management for all those health issues as well. He was monitored closely for any potential  problems that may arise as a result of and or during detoxification treatment. Patient tolerated his treatment regimen and detoxification treatment without any significant adverse effects and or reactions presented.  Patient attended treatment team  meeting this am and met with the team. His symptoms, substance abuse issues, response to treatment and discharge plans discussed. Patient endorsed that he is doing well and stable for discharge to pursue the next phase of his substance abuse treatment. It was agreed upon between patient and the team that he will be discharged from this hospital to go to [facility] for further substance abuse treatment. He left [facility] at 07:00 am to make his scheduled 08:00 am appointment at [facility].   Upon discharge, patient adamantly denies suicidal, homicidal ideations, auditory, visual hallucinations, delusional thinking and or withdrawal symptoms. Patient left [facility] with all personal belongings in no apparent distress. He received from [facility] a 2 weeks worth samples of his discharge medications. Transportation per     Consults:  None  Significant Diagnostic Studies:  labs: CBC with diff, CMP, UDS, Toxicology tests.  Discharge Vitals:   Blood pressure 109/76, pulse 120, temperature 97.7 F (36.5 C), temperature source Oral, resp. rate 16, height 5' 7.5" (1.715 m), weight 94.802 kg (209 lb), SpO2 98.00%. Body mass index is 32.25 kg/(m^2). Lab Results:   No results found for this or any previous visit (from the past 72 hour(s)).  Physical Findings: AIMS: Facial and Oral Movements Muscles of Facial Expression: None, normal Lips and Perioral Area: None, normal Jaw: None, normal Tongue: None, normal,Extremity Movements Upper (arms, wrists, hands, fingers): None, normal Lower (legs, knees, ankles, toes): None, normal, Trunk Movements Neck, shoulders, hips: None, normal, Overall Severity Severity of abnormal movements (highest score from questions above): None, normal Incapacitation due to abnormal movements: None, normal Patient's awareness of abnormal movements (rate only patient's report): No Awareness, Dental Status Current problems with teeth and/or dentures?: No Does patient  usually wear dentures?: No  CIWA:  CIWA-Ar Total: 4  COWS:     Psychiatric Specialty Exam: See Psychiatric Specialty Exam and Suicide Risk Assessment completed by Attending Physician prior to discharge.  Discharge destination:  [facility]  Is patient on multiple antipsychotic therapies at discharge:  No   Has Patient had three or more failed trials of antipsychotic monotherapy by history:  NO  Recommended Plan for Multiple Antipsychotic Therapies: NA     Medication List     As of 06/16/2012  4:26 PM    TAKE these medications      Indication    citalopram 20 MG tablet   Commonly known as: CELEXA   Take 1 tablet (20 mg total) by mouth daily. For depression    Indication: Depression      gabapentin 300 MG capsule   Commonly known as: NEURONTIN   Take 2 capsules (600 mg total) by mouth 3 (three) times daily. For anxiety    Indication: Agitation, Alcohol Withdrawal Syndrome, Trouble Sleeping      hydrOXYzine 25 MG tablet   Commonly known as: ATARAX/VISTARIL   Take 1 tablet (25 mg total) by mouth every 6 (six) hours as needed for anxiety (anxiety). For anxiety    Indication: Anxiety Neurosis      lisinopril 10 MG tablet   Commonly known as: PRINIVIL,ZESTRIL   Take 1 tablet (10 mg total) by mouth daily. For high blood pressure control.       multivitamin with minerals Tabs   Take 1 tablet by mouth  daily. For vitamin supplement       naproxen 500 MG tablet   Commonly known as: NAPROSYN   Take 1 tablet (500 mg total) by mouth 2 (two) times daily as needed. For moderate pain       nicotine 21 mg/24hr patch   Commonly known as: NICODERM CQ - dosed in mg/24 hours   Place 1 patch onto the skin daily. For nicotine cravings       traZODone 50 MG tablet   Commonly known as: DESYREL   Take 1 tablet (50 mg total) by mouth at bedtime as needed and may repeat dose one time if needed for sleep. For sleep          Follow-up Information    Follow up with [facility]. On 06/15/2012. (Arrive promptly at 8:00 am!!)    Contact information:   [address] [place], [place] [address] [phone]        Follow-up recommendations:  Activity:  as tolerated Other:  Keep all scheduled follow-up appointments as recommended.   Comments:  Take all your medications as prescribed by your mental healthcare provider. Report any adverse effects and or reactions from your medicines to your outpatient provider promptly. Patient is instructed and cautioned to not engage in alcohol and or illegal drug use while on prescription medicines. In the event of worsening symptoms, patient is instructed to call the crisis hotline, 911 and or go to the nearest ED for appropriate evaluation and treatment of symptoms. Follow-up with your primary care provider for your other medical issues, concerns and or health care needs.    Total Discharge Time:  Greater than 30 minutes.  SignedArmandina Stammer I 06/16/2012, 4:26 PM

## 2012-06-14 NOTE — Progress Notes (Signed)
Psychoeducational Group Note  Date:  06/14/2012 Time:  2000 Group Topic/Focus:  NA group  Participation Level:  Active  Participation Quality:  Appropriate  Affect:  Appropriate  Cognitive:  Appropriate  Insight:  Engaged  Engagement in Group:  Engaged  Additional Comments:    Brent Gay [name] 06/14/2012, 9:02 PM

## 2012-06-15 NOTE — Progress Notes (Signed)
[facility] Adult Case Management Discharge Plan :  Will you be returning to the same living situation after discharge: No. Homeless - going for further treatment from here At discharge, do you have transportation home?:Yes,  access to transportation Do you have the ability to pay for your medications:Yes,  access to meds  Release of information consent forms completed and in the chart;  Patient's signature needed at discharge.  Patient to Follow up at: Follow-up Information    Follow up with [facility]. On 06/15/2012. (Arrive promptly at 8:00 am!!)    Contact information:   [address] [place], [place] [address] [phone]         Patient denies SI/HI:   Yes,  denies SI/HI    Safety Planning and Suicide Prevention discussed:  Yes,  discussed with pt  No recommendations from CSW.  No further needs voiced by pt.  Pt stable to discharge.    Carmina Miller 06/15/2012, 10:17 AM

## 2012-06-15 NOTE — Progress Notes (Signed)
Pt was discharged out of the search room at 0617 where his belongings were returned.  Pt claims he had a small paper bag containing about $5 in change in his room and now it is missing.  This is the first mention of missing money by the patient.  Discharge instructions reviewed with patient.  Also reviewed process of taking the bus to be picked up by [name] staff at the bus stop at [place] on [address]  Pt was anxious and kept stalling to prolong discharge, but did sign the paperwork.  Pt was given his 2 week supply of medications along with prescriptions.  Pt was provided breakfast and a heavy jacket for the cold weather.  Pt still denies SI/HI/AV.  Pt was taken to the bus stop by security.

## 2012-06-16 MED ORDER — NICOTINE 21 MG/24HR TD PT24: 1.0000 | MEDICATED_PATCH | TRANSDERMAL | Status: DC

## 2012-06-16 MED ORDER — NAPROXEN 500 MG PO TABS: 500.0000 mg | ORAL_TABLET | Freq: Two times a day (BID) | ORAL | Status: DC | PRN

## 2012-06-19 NOTE — Progress Notes (Signed)
Patient Discharge Instructions:  After Visit Summary (AVS):   Faxed to:  06/19/12 Psychiatric Admission Assessment Note:   Faxed to:  06/19/12 Suicide Risk Assessment - Discharge Assessment:   Faxed to:  06/19/12 Faxed/Sent to the Next Level Care provider:  06/19/12 Faxed to [facility] @ [phone]  Jerelene Redden, 06/19/2012, 3:34 PM

## 2012-06-21 NOTE — Discharge Summary (Signed)
Agree with assessment and plan Sherronda Sweigert A. Zayna Toste, M.D. 

## 2013-08-02 DIAGNOSIS — IMO0002 Reserved for concepts with insufficient information to code with codable children: Secondary | ICD-10-CM | POA: Insufficient documentation

## 2013-12-09 ENCOUNTER — Encounter (HOSPITAL_COMMUNITY): Payer: Self-pay | Admitting: Emergency Medicine

## 2013-12-09 ENCOUNTER — Emergency Department (HOSPITAL_COMMUNITY)
Admission: EM | Admit: 2013-12-09 | Discharge: 2013-12-10 | Disposition: A | Discharge status: Other Acute Inpatient Hospital | Payer: Self-pay | Attending: Emergency Medicine | Admitting: Emergency Medicine

## 2013-12-09 ENCOUNTER — Encounter (HOSPITAL_COMMUNITY): Payer: Self-pay | Admitting: *Deleted

## 2013-12-09 DIAGNOSIS — I10 Essential (primary) hypertension: Secondary | ICD-10-CM | POA: Insufficient documentation

## 2013-12-09 DIAGNOSIS — Z87891 Personal history of nicotine dependence: Secondary | ICD-10-CM | POA: Insufficient documentation

## 2013-12-09 DIAGNOSIS — F1020 Alcohol dependence, uncomplicated: Secondary | ICD-10-CM | POA: Insufficient documentation

## 2013-12-09 DIAGNOSIS — Z79899 Other long term (current) drug therapy: Secondary | ICD-10-CM | POA: Insufficient documentation

## 2013-12-09 DIAGNOSIS — K219 Gastro-esophageal reflux disease without esophagitis: Secondary | ICD-10-CM | POA: Insufficient documentation

## 2013-12-09 DIAGNOSIS — F3289 Other specified depressive episodes: Secondary | ICD-10-CM | POA: Insufficient documentation

## 2013-12-09 DIAGNOSIS — G8929 Other chronic pain: Secondary | ICD-10-CM | POA: Insufficient documentation

## 2013-12-09 DIAGNOSIS — Z8719 Personal history of other diseases of the digestive system: Secondary | ICD-10-CM | POA: Insufficient documentation

## 2013-12-09 DIAGNOSIS — F32A Depression, unspecified: Secondary | ICD-10-CM

## 2013-12-09 DIAGNOSIS — Z791 Long term (current) use of non-steroidal anti-inflammatories (NSAID): Secondary | ICD-10-CM | POA: Insufficient documentation

## 2013-12-09 DIAGNOSIS — F329 Major depressive disorder, single episode, unspecified: Secondary | ICD-10-CM

## 2013-12-09 LAB — ACETAMINOPHEN LEVEL: Acetaminophen (Tylenol), Serum: <15 ug/mL (ref 10–30)

## 2013-12-09 LAB — RAPID URINE DRUG SCREEN, HOSP PERFORMED
Amphetamines: NOT DETECTED
BARBITURATES: NOT DETECTED
Benzodiazepines: NOT DETECTED
Cocaine: NOT DETECTED
Opiates: NOT DETECTED
TETRAHYDROCANNABINOL: NOT DETECTED

## 2013-12-09 LAB — COMPREHENSIVE METABOLIC PANEL
ALK PHOS: 69 U/L (ref 39–117)
ALT: 41 U/L (ref 0–53)
AST: 47 U/L — ABNORMAL HIGH (ref 0–37)
Albumin: 4.3 g/dL (ref 3.5–5.2)
BUN: 7 mg/dL (ref 6–23)
CALCIUM: 8.7 mg/dL (ref 8.4–10.5)
CO2: 24 meq/L (ref 19–32)
Chloride: 95 mEq/L — ABNORMAL LOW (ref 96–112)
Creatinine, Ser: 0.68 mg/dL (ref 0.50–1.35)
GLUCOSE: 119 mg/dL — AB (ref 70–99)
POTASSIUM: 4.1 meq/L (ref 3.7–5.3)
SODIUM: 139 meq/L (ref 137–147)
Total Bilirubin: 0.5 mg/dL (ref 0.3–1.2)
Total Protein: 7.8 g/dL (ref 6.0–8.3)

## 2013-12-09 LAB — SALICYLATE LEVEL: Salicylate Lvl: <2 mg/dL — ABNORMAL LOW (ref 2.8–20.0)

## 2013-12-09 LAB — CBC
HEMATOCRIT: 43.1 % (ref 39.0–52.0)
HEMOGLOBIN: 15.4 g/dL (ref 13.0–17.0)
MCH: 31.6 pg (ref 26.0–34.0)
MCHC: 35.7 g/dL (ref 30.0–36.0)
MCV: 88.3 fL (ref 78.0–100.0)
Platelets: 210 10*3/uL (ref 150–400)
RBC: 4.88 MIL/uL (ref 4.22–5.81)
RDW: 13 % (ref 11.5–15.5)
WBC: 6.8 10*3/uL (ref 4.0–10.5)

## 2013-12-09 LAB — ETHANOL
ALCOHOL ETHYL (B): 347 mg/dL — AB (ref 0–11)
Alcohol, Ethyl (B): 188 mg/dL — ABNORMAL HIGH (ref 0–11)

## 2013-12-09 MED ORDER — ONDANSETRON HCL 4 MG/2ML IJ SOLN
4.0000 mg | Freq: Once | INTRAMUSCULAR | Status: AC
  Administered 2013-12-09: 4 mg via INTRAVENOUS
  Filled 2013-12-09: qty 2

## 2013-12-09 MED ORDER — THIAMINE HCL 100 MG/ML IJ SOLN
100.0000 mg | Freq: Every day | INTRAMUSCULAR | Status: DC
  Administered 2013-12-09: 100 mg via INTRAVENOUS
  Filled 2013-12-09: qty 2

## 2013-12-09 MED ORDER — VITAMIN B-1 100 MG PO TABS: 100.0000 mg | ORAL_TABLET | Freq: Every day | ORAL | Status: DC

## 2013-12-09 MED ORDER — LORAZEPAM 1 MG PO TABS
0.0000 mg | ORAL_TABLET | Freq: Two times a day (BID) | ORAL | Status: DC
  Administered 2013-12-09: 2 mg via ORAL

## 2013-12-09 MED ORDER — ACETAMINOPHEN 325 MG PO TABS
650.0000 mg | ORAL_TABLET | ORAL | Status: DC | PRN
  Filled 2013-12-09: qty 2

## 2013-12-09 MED ORDER — LORAZEPAM 1 MG PO TABS
0.0000 mg | ORAL_TABLET | Freq: Four times a day (QID) | ORAL | Status: DC
  Administered 2013-12-10: 1 mg via ORAL
  Filled 2013-12-09: qty 1
  Filled 2013-12-09: qty 2
  Filled 2013-12-09: qty 1
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
## 2013-12-09 NOTE — ED Notes (Addendum)
Pt in paper scrubs and wanded by security.  
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
## 2013-12-09 NOTE — ED Notes (Signed)
Per pt he is here for alcohol detox. sts 1/2 gallon of liquor a day. sts had a few drinks this am. Denies SI or HI

## 2013-12-09 NOTE — ED Notes (Signed)
Pt repositioned on cart. sats at 87% room air before repositioning then 90 % after repositioning placed pt on 4 L oxygen with sats to 97% pt follows commands though has increased sleepiness

## 2013-12-09 NOTE — BH Assessment (Signed)
[facility] Assessment Progress Note  Spoke with Roxy Horsemanobert Browning, PA and took history of pt, scheduled tele assessment for 17:20 with RN.

## 2013-12-09 NOTE — ED Notes (Signed)
telepsych at bedside.

## 2013-12-09 NOTE — ED Notes (Signed)
Pt on paper scrubs belongings removed from room labeled and stored

## 2013-12-09 NOTE — BH Assessment (Addendum)
[facility] Assessment Progress Note  Pt accepted to Obs bed 1 by Julieanne CottonJosephine, NP when CIWA gets below 8 and pt has not vomited for over 2 hours.  Call TTS at [phone] to confirm and then call report to obs at [phone].

## 2013-12-10 ENCOUNTER — Observation Stay (HOSPITAL_COMMUNITY)
Admission: AD | Admit: 2013-12-10 | Discharge: 2013-12-10 | Disposition: A | Discharge status: Home / Self Care | Payer: Federal, State, Local not specified - Other | Source: Intra-hospital | Attending: Psychiatry | Admitting: Psychiatry

## 2013-12-10 ENCOUNTER — Encounter (HOSPITAL_COMMUNITY): Payer: Self-pay | Admitting: *Deleted

## 2013-12-10 DIAGNOSIS — I10 Essential (primary) hypertension: Secondary | ICD-10-CM | POA: Insufficient documentation

## 2013-12-10 DIAGNOSIS — K709 Alcoholic liver disease, unspecified: Secondary | ICD-10-CM | POA: Insufficient documentation

## 2013-12-10 DIAGNOSIS — G8929 Other chronic pain: Secondary | ICD-10-CM | POA: Insufficient documentation

## 2013-12-10 DIAGNOSIS — F1994 Other psychoactive substance use, unspecified with psychoactive substance-induced mood disorder: Secondary | ICD-10-CM | POA: Insufficient documentation

## 2013-12-10 DIAGNOSIS — F1010 Alcohol abuse, uncomplicated: Secondary | ICD-10-CM | POA: Diagnosis present

## 2013-12-10 DIAGNOSIS — F1020 Alcohol dependence, uncomplicated: Principal | ICD-10-CM | POA: Insufficient documentation

## 2013-12-10 DIAGNOSIS — K219 Gastro-esophageal reflux disease without esophagitis: Secondary | ICD-10-CM | POA: Insufficient documentation

## 2013-12-10 DIAGNOSIS — M549 Dorsalgia, unspecified: Secondary | ICD-10-CM | POA: Insufficient documentation

## 2013-12-10 DIAGNOSIS — Z87891 Personal history of nicotine dependence: Secondary | ICD-10-CM | POA: Insufficient documentation

## 2013-12-10 DIAGNOSIS — F332 Major depressive disorder, recurrent severe without psychotic features: Secondary | ICD-10-CM | POA: Insufficient documentation

## 2013-12-10 MED ORDER — THIAMINE HCL 100 MG/ML IJ SOLN: 100.0000 mg | Freq: Once | INTRAMUSCULAR | Status: DC

## 2013-12-10 MED ORDER — ONDANSETRON 4 MG PO TBDP
4.0000 mg | ORAL_TABLET | Freq: Once | ORAL | Status: AC
  Administered 2013-12-10: 4 mg via ORAL
  Filled 2013-12-10: qty 1

## 2013-12-10 MED ORDER — HYDROXYZINE HCL 25 MG PO TABS: 25.0000 mg | ORAL_TABLET | Freq: Four times a day (QID) | ORAL | Status: DC | PRN

## 2013-12-10 MED ORDER — ALUM & MAG HYDROXIDE-SIMETH 200-200-20 MG/5ML PO SUSP: 30.0000 mL | ORAL | Status: DC | PRN

## 2013-12-10 MED ORDER — VITAMIN B-1 100 MG PO TABS
100.0000 mg | ORAL_TABLET | Freq: Every day | ORAL | Status: DC
  Filled 2013-12-10: qty 1

## 2013-12-10 MED ORDER — CHLORDIAZEPOXIDE HCL 25 MG PO CAPS: 25.0000 mg | ORAL_CAPSULE | Freq: Three times a day (TID) | ORAL | Status: DC

## 2013-12-10 MED ORDER — ONDANSETRON 4 MG PO TBDP: 4.0000 mg | ORAL_TABLET | Freq: Four times a day (QID) | ORAL | Status: DC | PRN

## 2013-12-10 MED ORDER — CHLORDIAZEPOXIDE HCL 25 MG PO CAPS
25.0000 mg | ORAL_CAPSULE | Freq: Four times a day (QID) | ORAL | Status: DC
  Administered 2013-12-10 (×3): 25 mg via ORAL
  Filled 2013-12-10 (×2): qty 1

## 2013-12-10 MED ORDER — CHLORDIAZEPOXIDE HCL 25 MG PO CAPS: 50.0000 mg | ORAL_CAPSULE | Freq: Once | ORAL | Status: DC

## 2013-12-10 MED ORDER — MAGNESIUM HYDROXIDE 400 MG/5ML PO SUSP: 30.0000 mL | Freq: Every day | ORAL | Status: DC | PRN

## 2013-12-10 MED ORDER — ADULT MULTIVITAMIN W/MINERALS CH
1.0000 | ORAL_TABLET | Freq: Every day | ORAL | Status: DC
  Administered 2013-12-10: 1 via ORAL
  Filled 2013-12-10 (×3): qty 1

## 2013-12-10 MED ORDER — CHLORDIAZEPOXIDE HCL 25 MG PO CAPS
25.0000 mg | ORAL_CAPSULE | Freq: Four times a day (QID) | ORAL | Status: DC | PRN
  Administered 2013-12-10: 25 mg via ORAL
  Filled 2013-12-10 (×2): qty 1

## 2013-12-10 MED ORDER — LOPERAMIDE HCL 2 MG PO CAPS: 2.0000 mg | ORAL_CAPSULE | ORAL | Status: DC | PRN

## 2013-12-10 MED ORDER — ACETAMINOPHEN 325 MG PO TABS: 650.0000 mg | ORAL_TABLET | Freq: Four times a day (QID) | ORAL | Status: DC | PRN

## 2013-12-10 MED ORDER — CHLORDIAZEPOXIDE HCL 25 MG PO CAPS: 25.0000 mg | ORAL_CAPSULE | ORAL | Status: DC

## 2013-12-10 MED ORDER — CHLORDIAZEPOXIDE HCL 25 MG PO CAPS: 25.0000 mg | ORAL_CAPSULE | Freq: Every day | ORAL | Status: DC

## 2013-12-10 NOTE — Progress Notes (Signed)
D:  Patient slept most of the morning.  Has been awake and chatting with peers this afternoon.  Denies suicidal ideation.  Expresses an [facility].  [name] in to talk with patient and to go over options for treatment.   A:  Librium protocol maintained throughout the day.  Encouraged PO fluids and have given patient Gatorade several times today.   R:  Cooperative with staff.  Interacting well with peers.  Tolerating medications.  Tolerating detox medications and symptoms are decreased.  Safety is maintained.

## 2013-12-10 NOTE — Progress Notes (Signed)
[facility] INPATIENT:  Family/Significant Other Suicide Prevention Education  Suicide Prevention Education:  Patient Refusal for Family/Significant Other Suicide Prevention Education: The patient Brent Gay has refused to provide written consent for family/significant other to be provided Family/Significant Other Suicide Prevention Education during admission and/or prior to discharge.    Celene KrasRobinson, Sandra G 12/10/2013, 6:23 AM

## 2013-12-10 NOTE — ED Notes (Signed)
Call from Dr. Farrel GobbleMcKinnin. Patient is cleared to go to behavioral health, after 2am. The accepting physician is Dr. Jannifer FranklinAkintayo.

## 2013-12-10 NOTE — H&P (Signed)
Psychiatric supervisory review with treatment team including face-to-face exam and interview with patient confirms these findings, diagnostic considerations and therapeutic interventions as medically necessary and stabilizing for discharge to the community. Grief for father's terminal brain cancer, brief loss of control of drinking with any withdrawal resolved, and reestablishing psychotherapeutic management for chronic recurrent major depression are addressed for resources in this area until he returns to [place] area where he has established treatment programming and resources.  Chauncey MannGlenn E. Jennings, MD

## 2013-12-10 NOTE — Progress Notes (Signed)
Patient ID: Brent HeightJohn F Gay, male   DOB: 10/10/1965, 48 y.o.   MRN: [id number] Pt requesting detox form alcohol. C/o depression r/t multiple stressors to include "dying father". Reports also he is not sleeping at all. He denies SI, HI, A/V. Pt verbally contracts for safety. Pt admitted to Observation unit for evaluation and stabilization.

## 2013-12-10 NOTE — Progress Notes (Signed)
Patient ID: Brent HeightJohn F Gay, male   DOB: 12/29/1965, 48 y.o.   MRN: [id number] Patient discharge to home.  He was given resources for housing and for outpatient treatment centers.  He denies suicidal ideation and states he feels ready to leave.  Escorted to search room for belongings and to the front lobby.  Sister was here to pick him up .

## 2013-12-10 NOTE — ED Notes (Signed)
Spoke with Kirby CriglerFran H, NP.  [facility]. Called Pod C to relay information to RN.  No answer.

## 2013-12-10 NOTE — ED Provider Notes (Signed)
Medical screening examination/treatment/procedure(s) were performed by non-physician practitioner and as supervising physician I was immediately available for consultation/collaboration.   EKG Interpretation None        Courtney F Horton, MD 12/10/13 1931 

## 2013-12-10 NOTE — Discharge Summary (Signed)
[name] DISCHARGE SUMMARY  Patient Identification:  Brent Gay Date of Evaluation:  12/10/2013 Chief Complaint:  ETOH DEPENDENCE  Subjective: Pt seen and chart reviewed. Pt denies SI, HI, and AVH, contracts for safety. Pt denies any physical withdrawal symptoms, stating that the alcohol is mostly out of his system and that the detoxification protocol helped greatly. Pt wants to follow up outpatient as he has a relationship in [facility] but is here in town to care for his father secondary to a terminal brain tumor. [place] TTS will assist pt in finding local followups while he is here in town.   History of Present Illness:: Brent Gay is an 48 y.o. male who came to [facility] requesting detox form alcohol. Pt lives in [place] and says that he is visiting [place] because he father (who he says was an Scientist, physiological in this hospital) is "on his death bed". He says he started drinking 9 days ago and has been drinking a half gallon of vodka daily. Pt says he has been staying at [facility] and then when their rooms were no longer available, he has been staying in his truck. He says he has been going to the [employer] in [place] and living at [employer] there. 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 Pt endorses symptoms of depression, and says he is not sleeping at all. He denies SI, HI, A/V, any hx of violence or criminal charges pending. He denies any suicide attempts in the past. He says he has no appetite when drinking and has lost 3 lbs. Pt is calm and cooperative with good eye contact and casual dress. He says he has been for previous treatment at [facility], [facility] and in [place]. Pt is alert and oriented x3 during assessment. He says his entire family is supportive of him. Brent Agent, NP recommends IP detox, but [facility] is currently full. It is possible for pt to come to Obs unit if his CIWA score goes down, but TTS will seek placement.   Total Time spent with  patient: 45 minutes  Psychiatric Specialty Exam: Physical Exam Full Physical Exam performed in ED; reviewed, stable, and I concur with this assessment.   Review of Systems  Constitutional: Negative.   HENT: Negative.   Eyes: Negative.   Respiratory: Negative.   Cardiovascular: Negative.   Gastrointestinal: Negative.   Genitourinary: Negative.   Musculoskeletal: Negative.   Skin: Negative.   Neurological: Negative.   Endo/Heme/Allergies: Negative.   Psychiatric/Behavioral: Positive for depression and substance abuse. The patient is nervous/anxious.     Blood pressure 131/84, pulse 94, temperature 98.9 F (37.2 C), temperature source Oral, resp. rate 18, height '5\' 8"'  (1.727 m), weight 93.441 kg (206 lb).Body mass index is 31.33 kg/(m^2).  General Appearance: Casual  Eye Contact::  Good  Speech:  Clear and Coherent  Volume:  Normal  Mood:  Anxious and Depressed  Affect:  Appropriate and Depressed  Thought Process:  Coherent and Goal Directed  Orientation:  Full (Time, Place, and Person)  Thought Content:  WDL  Suicidal Thoughts:  No  Homicidal Thoughts:  No  Memory:  Immediate;   Fair Recent;   Fair Remote;   Fair  Judgement:  Fair  Insight:  Fair  Psychomotor Activity:  Normal  Concentration:  Fair  Recall:  [employer] of Knowledge:Fair  Language: Good  Akathisia:  No  Handed:    AIMS (if indicated):  Assets:  Communication Skills Desire for Improvement Resilience  Sleep:       Musculoskeletal: Strength & Muscle Tone: within normal limits Gait & Station: normal Patient leans: N/A   Past Medical History:   Past Medical History  Diagnosis Date  . Hypertension   . Chronic back pain   . Alcohol abuse   . GERD (gastroesophageal reflux disease)   . Gastritis   . Atypical chest pain   . Chronic pain   . Alcoholic liver disease    None. Allergies:   Allergies  Allergen Reactions  . Citalopram Swelling    Feet swelling   PTA  Medications: Prescriptions prior to admission  Medication Sig Dispense Refill  . ibuprofen (ADVIL,MOTRIN) 200 MG tablet Take 200-1,400 mg by mouth daily as needed for mild pain.      . Multiple Vitamin (MULTIVITAMIN WITH MINERALS) TABS Take 1 tablet by mouth daily. For vitamin supplement      . nabumetone (RELAFEN) 750 MG tablet Take 750 mg by mouth daily as needed for moderate pain.        Previous Psychotropic Medications:  Medication/Dose  SEE MAR               Substance Abuse History in the last 12 months:  Yes.    Consequences of Substance Abuse: Medical Consequences:  Hospitalization  Social History:  reports that he quit smoking about 18 months ago. His smoking use included Cigarettes. He has a .5 pack-year smoking history. He has quit using smokeless tobacco. He reports that he drinks alcohol. He reports that he uses illicit drugs (Cocaine). Additional Social History: Pain Medications: denies Prescriptions: denies Over the Counter: denies History of alcohol / drug use?: Yes Longest period of sobriety (when/how long): 7 months Negative Consequences of Use: Financial;Personal relationships;Work / Youth worker Withdrawal Symptoms: Tremors;Sweats;Irritability;Agitation Name of Substance 1: alcohol 1 - Age of First Use: teens 1 - Amount (size/oz): 1/2 gallon vodka 1 - Frequency: daily 1 - Duration: 9 days, and off and on for a long time before that 1 - Last Use / Amount: this am                   Family History:   Family History  Problem Relation Age of Onset  . Diabetes Mellitus II Father     Results for orders placed during the hospital encounter of 12/09/13 (from the past 72 hour(s))  ACETAMINOPHEN LEVEL     Status: None   Collection Time    12/09/13  2:13 PM      Result Value Ref Range   Acetaminophen (Tylenol), Serum <15.0  10 - 30 ug/mL   Comment:            THERAPEUTIC CONCENTRATIONS VARY     SIGNIFICANTLY. A RANGE OF 10-30     ug/mL MAY BE AN  EFFECTIVE     CONCENTRATION FOR MANY PATIENTS.     HOWEVER, SOME ARE BEST TREATED     AT CONCENTRATIONS OUTSIDE THIS     RANGE.     ACETAMINOPHEN CONCENTRATIONS     >150 ug/mL AT 4 HOURS AFTER     INGESTION AND >50 ug/mL AT 12     HOURS AFTER INGESTION ARE     OFTEN ASSOCIATED WITH TOXIC     REACTIONS.  CBC     Status: None   Collection Time    12/09/13  2:13 PM      Result Value Ref Range   WBC 6.8  4.0 - 10.5 K/uL   RBC 4.88  4.22 - 5.81 MIL/uL   Hemoglobin 15.4  13.0 - 17.0 g/dL   HCT 43.1  39.0 - 52.0 %   MCV 88.3  78.0 - 100.0 fL   MCH 31.6  26.0 - 34.0 pg   MCHC 35.7  30.0 - 36.0 g/dL   RDW 13.0  11.5 - 15.5 %   Platelets 210  150 - 400 K/uL  COMPREHENSIVE METABOLIC PANEL     Status: Abnormal   Collection Time    12/09/13  2:13 PM      Result Value Ref Range   Sodium 139  137 - 147 mEq/L   Potassium 4.1  3.7 - 5.3 mEq/L   Comment: HEMOLYSIS AT THIS LEVEL MAY AFFECT RESULT   Chloride 95 (*) 96 - 112 mEq/L   CO2 24  19 - 32 mEq/L   Glucose, Bld 119 (*) 70 - 99 mg/dL   BUN 7  6 - 23 mg/dL   Creatinine, Ser 0.68  0.50 - 1.35 mg/dL   Calcium 8.7  8.4 - 10.5 mg/dL   Total Protein 7.8  6.0 - 8.3 g/dL   Albumin 4.3  3.5 - 5.2 g/dL   AST 47 (*) 0 - 37 U/L   Comment: HEMOLYSIS AT THIS LEVEL MAY AFFECT RESULT   ALT 41  0 - 53 U/L   Comment: HEMOLYSIS AT THIS LEVEL MAY AFFECT RESULT   Alkaline Phosphatase 69  39 - 117 U/L   Total Bilirubin 0.5  0.3 - 1.2 mg/dL   GFR calc non Af Amer >90  >90 mL/min   GFR calc Af Amer >90  >90 mL/min   Comment: (NOTE)     The eGFR has been calculated using the CKD EPI equation.     This calculation has not been validated in all clinical situations.     eGFR's persistently <90 mL/min signify possible Chronic Kidney     Disease.  ETHANOL     Status: Abnormal   Collection Time    12/09/13  2:13 PM      Result Value Ref Range   Alcohol, Ethyl (B) 347 (*) 0 - 11 mg/dL   Comment:            LOWEST DETECTABLE LIMIT FOR     SERUM ALCOHOL  IS 11 mg/dL     FOR MEDICAL PURPOSES ONLY  SALICYLATE LEVEL     Status: Abnormal   Collection Time    12/09/13  2:13 PM      Result Value Ref Range   Salicylate Lvl <8.5 (*) 2.8 - 20.0 mg/dL  URINE RAPID DRUG SCREEN (HOSP PERFORMED)     Status: None   Collection Time    12/09/13  3:36 PM      Result Value Ref Range   Opiates NONE DETECTED  NONE DETECTED   Cocaine NONE DETECTED  NONE DETECTED   Benzodiazepines NONE DETECTED  NONE DETECTED   Amphetamines NONE DETECTED  NONE DETECTED   Tetrahydrocannabinol NONE DETECTED  NONE DETECTED   Barbiturates NONE DETECTED  NONE DETECTED   Comment:            DRUG SCREEN FOR MEDICAL PURPOSES     ONLY.  IF CONFIRMATION IS NEEDED     FOR ANY PURPOSE, NOTIFY LAB     WITHIN 5 DAYS.                LOWEST DETECTABLE LIMITS     FOR  URINE DRUG SCREEN     Drug Class       Cutoff (ng/mL)     Amphetamine      1000     Barbiturate      200     Benzodiazepine   409     Tricyclics       811     Opiates          300     Cocaine          300     THC              50  ETHANOL     Status: Abnormal   Collection Time    12/09/13  9:33 PM      Result Value Ref Range   Alcohol, Ethyl (B) 188 (*) 0 - 11 mg/dL   Comment:            LOWEST DETECTABLE LIMIT FOR     SERUM ALCOHOL IS 11 mg/dL     FOR MEDICAL PURPOSES ONLY   Psychological Evaluations:  Assessment:   DSM5:  Substance/Addictive Disorders:  Alcohol Related Disorder - Severe (303.90) Depressive Disorders:  Major Depressive Disorder - Severe (296.23)  AXIS I:  Alcohol Abuse, Major Depression, Recurrent severe and Substance Induced Mood Disorder AXIS II:  Cluster B Traits AXIS III:   Past Medical History  Diagnosis Date  . Hypertension   . Chronic back pain   . Alcohol abuse   . GERD (gastroesophageal reflux disease)   . Gastritis   . Atypical chest pain   . Chronic pain   . Alcoholic liver disease    AXIS IV:  other psychosocial or environmental problems and problems related to  social environment AXIS V:  61-70 mild symptoms  Treatment Plan/Recommendations:   Review of chart, vital signs, medications, and notes.  1-Medication management for depression and anxiety: Medications reviewed with the patient and she stated no untoward effects, unchanged. 2-Coping skills for depression, anxiety  3-Continue crisis stabilization and management  4-Address health issues--monitoring vital signs, stable  5-Treatment plan in progress to prevent relapse of depression and anxiety  Treatment Plan Summary: -Discharge home. Pt will follow up with outpatient resources as provided by [name] with [facility].   Current Medications:  Current Facility-Administered Medications  Medication Dose Route Frequency Provider Last Rate Last Dose  . acetaminophen (TYLENOL) tablet 650 mg  650 mg Oral Q6H PRN Lurena Nida, NP      . alum & mag hydroxide-simeth (MAALOX/MYLANTA) 200-200-20 MG/5ML suspension 30 mL  30 mL Oral Q4H PRN Lurena Nida, NP      . chlordiazePOXIDE (LIBRIUM) capsule 25 mg  25 mg Oral Q6H PRN Lurena Nida, NP   25 mg at 12/10/13 0907  . chlordiazePOXIDE (LIBRIUM) capsule 25 mg  25 mg Oral QID Lurena Nida, NP   25 mg at 12/10/13 0804   Followed by  . [START ON 12/11/2013] chlordiazePOXIDE (LIBRIUM) capsule 25 mg  25 mg Oral TID Lurena Nida, NP       Followed by  . [START ON 12/12/2013] chlordiazePOXIDE (LIBRIUM) capsule 25 mg  25 mg Oral BH-qamhs Lurena Nida, NP       Followed by  . [START ON 12/13/2013] chlordiazePOXIDE (LIBRIUM) capsule 25 mg  25 mg Oral Daily Lurena Nida, NP      . chlordiazePOXIDE (LIBRIUM) capsule 50 mg  50 mg Oral Once Lurena Nida, NP      .  hydrOXYzine (ATARAX/VISTARIL) tablet 25 mg  25 mg Oral Q6H PRN Lurena Nida, NP      . loperamide (IMODIUM) capsule 2-4 mg  2-4 mg Oral PRN Lurena Nida, NP      . magnesium hydroxide (MILK OF MAGNESIA) suspension 30 mL  30 mL Oral Daily PRN Lurena Nida, NP      . multivitamin with minerals tablet 1 tablet  1  tablet Oral Daily Lurena Nida, NP   1 tablet at 12/10/13 0804  . ondansetron (ZOFRAN-ODT) disintegrating tablet 4 mg  4 mg Oral Q6H PRN Lurena Nida, NP      . thiamine (B-1) injection 100 mg  100 mg Intramuscular Once Lurena Nida, NP      . Derrill Memo ON 12/11/2013] thiamine (VITAMIN B-1) tablet 100 mg  100 mg Oral Daily Lurena Nida, NP        Calden, Dorsey, FNP-BC 12/10/2013 06:11 PM    Suicide Risk Assessment     Nursing information obtained from:  Patient Demographic factors:  Male;Caucasian;Low socioeconomic status Current Mental Status:  NA Loss Factors:  Financial problems / change in socioeconomic status Historical Factors:  Family history of mental illness or substance abuse Risk Reduction Factors:  NA Total Time spent with patient: 45 minutes  CLINICAL FACTORS:   Depression:   Anhedonia Comorbid alcohol abuse/dependence Hopelessness Impulsivity Insomnia Alcohol/Substance Abuse/Dependencies  Psychiatric Specialty Exam:     Blood pressure 131/84, pulse 94, temperature 98.9 F (37.2 C), temperature source Oral, resp. rate 18, height '5\' 8"'  (1.727 m), weight 93.441 kg (206 lb).Body mass index is 31.33 kg/(m^2).  SEE PSE ABOVE  COGNITIVE FEATURES THAT CONTRIBUTE TO RISK:  Closed-mindedness    SUICIDE RISK:   Minimal: No identifiable suicidal ideation.  Patients presenting with no risk factors but with morbid ruminations; may be classified as minimal risk based on the severity of the depressive symptoms  PLAN OF CARE: -Discharge home. Pt will followup locally with alcohol rehab with resources as provided by [facility].   Jerard, Bays, FNP-BC 12/10/2013, 6:37 PM

## 2013-12-10 NOTE — Discharge Instructions (Signed)
The following Chemical Dependency Intensive Outpatient Programs in this area may be helpful to you in maintaining a sober lifestyle:       [facility]      [address]      [place], [place] [address]      Contact person: Bobette Monn Evans, LCAS      [phone]      Let Dewayne Hatchnn know that you would need a scholarship from the [facility] in order to enroll.       [facility]      [address]      [place], [place] [address]      [phone]  In addition to these programs, [facility] may also be a valuable resource in maintaining sobriety.  Please refer to the printed information provided by Observation Unit staff.  If you find that you need homeless shelter resources, the [facility] provides these services in the [place] area:       [facility]      [address]      [place], [place] [address]      [phone]

## 2013-12-10 NOTE — Plan of Care (Signed)
[facility] Observation Crisis Plan  Reason for Crisis Plan:  Substance Abuse   Plan of Care:  Refer to local CD-IOP programs, local [address] [facility], and the [facility] shelter  Family Support:    Family members have been alienated by pt's relapse; uncertain how supportive they may be at this time.  Current Living Environment:  Living Arrangements: Alone Pt reports that he has been living in [facility] [place], where he is likely to be able to return.  However, he is currently compelled to stay in [place] due to family health problems.  He has no place to stay locally.  Insurance:   Hospital Account   Name Acct ID Class Status Primary Coverage   Brent Gay, Brent Gay [id number] BEHAVIORAL HEALTH OBSERVATION Open None        Guarantor Account (for Hospital Account [id number])   Name Relation to Pt Service Area Active? Acct Type   Brent Gay, Brent Gay Self [facility] Yes Behavioral Health   Address Phone       [address] [place], [place] [address] [phone])          Coverage Information (for Hospital Account [id number])   Not on file      Legal Guardian:   Self  Primary Care Brent Gay:  No primary Brent Gay on file. [facility]  Current Outpatient Providers:  [facility]  Psychiatrist:    [facility]  Counselor/Therapist:    [facility] (currently in CD-IOP)  Compliant with Medications:  No; Ran out of Relafen about 1 week ago.  Additional Information: After consulting with Brent MilchGlenn Jennings, MD it has been determined that pt does not present a life threatening danger to himself or others, and that psychiatric hospitalization is not indicated for him at this time.  Pt will be provided with information about CD-IOP at [facility] and at [facility]  He has also requested contact information for [facility] and for area [facility].   Raphael GibneyHughes, Brent Patrick 6/29/20155:22 PM

## 2013-12-10 NOTE — H&P (Signed)
[facility] OBS H&P  Patient Identification:  Brent Gay Date of Evaluation:  12/10/2013 Chief Complaint:  ETOH DEPENDENCE  Subjective: Pt seen and chart reviewed. Pt denies SI, HI, and AVH, contracts for safety. Pt denies any physical withdrawal symptoms, stating that the alcohol is mostly out of his system and that the detoxification protocol helped greatly. Pt wants to follow up outpatient as he has a relationship in [facility] [place] [facility] [place] but is here in town to care for his father secondary to a terminal brain tumor. [facility] will assist pt in finding local followups while he is here in town.   History of Present Illness:: Brent Gay is an 48 y.o. male who came to [facility] requesting detox form alcohol. Pt lives in [place] and says that he is visiting [place] because he father (who he says was an Scientist, physiological in this hospital) is "on his death bed". He says he started drinking 9 days ago and has been drinking a half gallon of vodka daily. Pt says he has been staying at [place] and then when their rooms were no longer available, he has been staying in his truck. He says he has been going to the [employer] in [place] and living at [employer] there. 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 Pt endorses symptoms of depression, and says he is not sleeping at all. He denies SI, HI, A/V, any hx of violence or criminal charges pending. He denies any suicide attempts in the past. He says he has no appetite when drinking and has lost 3 lbs. Pt is calm and cooperative with good eye contact and casual dress. He says he has been for previous treatment at [facility], [facility] and in [place]. Pt is alert and oriented x3 during assessment. He says his entire family is supportive of him. Reginold Agent, NP recommends IP detox, but [facility] is currently full. It is possible for pt to come to Obs unit if his CIWA score goes down, but TTS will seek placement.  Total Time spent with patient: 45  minutes  Psychiatric Specialty Exam: Physical Exam Full Physical Exam performed in ED; reviewed, stable, and I concur with this assessment.   Review of Systems  Constitutional: Negative.   HENT: Negative.   Eyes: Negative.   Respiratory: Negative.   Cardiovascular: Negative.   Gastrointestinal: Negative.   Genitourinary: Negative.   Musculoskeletal: Negative.   Skin: Negative.   Neurological: Negative.   Endo/Heme/Allergies: Negative.   Psychiatric/Behavioral: Positive for depression and substance abuse. The patient is nervous/anxious.     Blood pressure 131/84, pulse 94, temperature 98.9 F (37.2 C), temperature source Oral, resp. rate 18, height _0  (1.727 m), weight 93.441 kg (206 lb).Body mass index is 31.33 kg/(m^2).  General Appearance: Casual  Eye Contact::  Good  Speech:  Clear and Coherent  Volume:  Normal  Mood:  Anxious and Depressed  Affect:  Appropriate and Depressed  Thought Process:  Coherent and Goal Directed  Orientation:  Full (Time, Place, and Person)  Thought Content:  WDL  Suicidal Thoughts:  No  Homicidal Thoughts:  No  Memory:  Immediate;   Fair Recent;   Fair Remote;   Fair  Judgement:  Fair  Insight:  Fair  Psychomotor Activity:  Normal  Concentration:  Fair  Recall:  [employer] of Knowledge:Fair  Language: Good  Akathisia:  No  Handed:    AIMS (if indicated):     Assets:  Communication Skills Desire for Improvement Resilience  Sleep:       Musculoskeletal: Strength & Muscle Tone: within normal limits Gait & Station: normal Patient leans: N/A   Past Medical History:   Past Medical History  Diagnosis Date  . Hypertension   . Chronic back pain   . Alcohol abuse   . GERD (gastroesophageal reflux disease)   . Gastritis   . Atypical chest pain   . Chronic pain   . Alcoholic liver disease    None. Allergies:   Allergies  Allergen Reactions  . Citalopram Swelling    Feet swelling   PTA Medications: Prescriptions prior to  admission  Medication Sig Dispense Refill  . ibuprofen (ADVIL,MOTRIN) 200 MG tablet Take 200-1,400 mg by mouth daily as needed for mild pain.      . Multiple Vitamin (MULTIVITAMIN WITH MINERALS) TABS Take 1 tablet by mouth daily. For vitamin supplement      . nabumetone (RELAFEN) 750 MG tablet Take 750 mg by mouth daily as needed for moderate pain.        Previous Psychotropic Medications:  Medication/Dose  SEE MAR               Substance Abuse History in the last 12 months:  Yes.    Consequences of Substance Abuse: Medical Consequences:  Hospitalization  Social History:  reports that he quit smoking about 18 months ago. His smoking use included Cigarettes. He has a .5 pack-year smoking history. He has quit using smokeless tobacco. He reports that he drinks alcohol. He reports that he uses illicit drugs (Cocaine). Additional Social History: Pain Medications: denies Prescriptions: denies Over the Counter: denies History of alcohol / drug use?: Yes Longest period of sobriety (when/how long): 7 months Negative Consequences of Use: Financial;Personal relationships;Work / Youth worker Withdrawal Symptoms: Tremors;Sweats;Irritability;Agitation Name of Substance 1: alcohol 1 - Age of First Use: teens 1 - Amount (size/oz): 1/2 gallon vodka 1 - Frequency: daily 1 - Duration: 9 days, and off and on for a long time before that 1 - Last Use / Amount: this am                   Family History:   Family History  Problem Relation Age of Onset  . Diabetes Mellitus II Father     Results for orders placed during the hospital encounter of 12/09/13 (from the past 72 hour(s))  ACETAMINOPHEN LEVEL     Status: None   Collection Time    12/09/13  2:13 PM      Result Value Ref Range   Acetaminophen (Tylenol), Serum <15.0  10 - 30 ug/mL   Comment:            THERAPEUTIC CONCENTRATIONS VARY     SIGNIFICANTLY. A RANGE OF 10-30     ug/mL MAY BE AN EFFECTIVE     CONCENTRATION FOR MANY  PATIENTS.     HOWEVER, SOME ARE BEST TREATED     AT CONCENTRATIONS OUTSIDE THIS     RANGE.     ACETAMINOPHEN CONCENTRATIONS     >150 ug/mL AT 4 HOURS AFTER     INGESTION AND >50 ug/mL AT 12     HOURS AFTER INGESTION ARE     OFTEN ASSOCIATED WITH TOXIC     REACTIONS.  CBC     Status: None   Collection Time    12/09/13  2:13 PM      Result Value Ref Range   WBC 6.8  4.0 -  10.5 K/uL   RBC 4.88  4.22 - 5.81 MIL/uL   Hemoglobin 15.4  13.0 - 17.0 g/dL   HCT 43.1  39.0 - 52.0 %   MCV 88.3  78.0 - 100.0 fL   MCH 31.6  26.0 - 34.0 pg   MCHC 35.7  30.0 - 36.0 g/dL   RDW 13.0  11.5 - 15.5 %   Platelets 210  150 - 400 K/uL  COMPREHENSIVE METABOLIC PANEL     Status: Abnormal   Collection Time    12/09/13  2:13 PM      Result Value Ref Range   Sodium 139  137 - 147 mEq/L   Potassium 4.1  3.7 - 5.3 mEq/L   Comment: HEMOLYSIS AT THIS LEVEL MAY AFFECT RESULT   Chloride 95 (*) 96 - 112 mEq/L   CO2 24  19 - 32 mEq/L   Glucose, Bld 119 (*) 70 - 99 mg/dL   BUN 7  6 - 23 mg/dL   Creatinine, Ser 0.68  0.50 - 1.35 mg/dL   Calcium 8.7  8.4 - 10.5 mg/dL   Total Protein 7.8  6.0 - 8.3 g/dL   Albumin 4.3  3.5 - 5.2 g/dL   AST 47 (*) 0 - 37 U/L   Comment: HEMOLYSIS AT THIS LEVEL MAY AFFECT RESULT   ALT 41  0 - 53 U/L   Comment: HEMOLYSIS AT THIS LEVEL MAY AFFECT RESULT   Alkaline Phosphatase 69  39 - 117 U/L   Total Bilirubin 0.5  0.3 - 1.2 mg/dL   GFR calc non Af Amer >90  >90 mL/min   GFR calc Af Amer >90  >90 mL/min   Comment: (NOTE)     The eGFR has been calculated using the CKD EPI equation.     This calculation has not been validated in all clinical situations.     eGFR's persistently <90 mL/min signify possible Chronic Kidney     Disease.  ETHANOL     Status: Abnormal   Collection Time    12/09/13  2:13 PM      Result Value Ref Range   Alcohol, Ethyl (B) 347 (*) 0 - 11 mg/dL   Comment:            LOWEST DETECTABLE LIMIT FOR     SERUM ALCOHOL IS 11 mg/dL     FOR MEDICAL PURPOSES  ONLY  SALICYLATE LEVEL     Status: Abnormal   Collection Time    12/09/13  2:13 PM      Result Value Ref Range   Salicylate Lvl <5.7 (*) 2.8 - 20.0 mg/dL  URINE RAPID DRUG SCREEN (HOSP PERFORMED)     Status: None   Collection Time    12/09/13  3:36 PM      Result Value Ref Range   Opiates NONE DETECTED  NONE DETECTED   Cocaine NONE DETECTED  NONE DETECTED   Benzodiazepines NONE DETECTED  NONE DETECTED   Amphetamines NONE DETECTED  NONE DETECTED   Tetrahydrocannabinol NONE DETECTED  NONE DETECTED   Barbiturates NONE DETECTED  NONE DETECTED   Comment:            DRUG SCREEN FOR MEDICAL PURPOSES     ONLY.  IF CONFIRMATION IS NEEDED     FOR ANY PURPOSE, NOTIFY LAB     WITHIN 5 DAYS.                LOWEST DETECTABLE LIMITS     FOR URINE DRUG  SCREEN     Drug Class       Cutoff (ng/mL)     Amphetamine      1000     Barbiturate      200     Benzodiazepine   735     Tricyclics       670     Opiates          300     Cocaine          300     THC              50  ETHANOL     Status: Abnormal   Collection Time    12/09/13  9:33 PM      Result Value Ref Range   Alcohol, Ethyl (B) 188 (*) 0 - 11 mg/dL   Comment:            LOWEST DETECTABLE LIMIT FOR     SERUM ALCOHOL IS 11 mg/dL     FOR MEDICAL PURPOSES ONLY   Psychological Evaluations:  Assessment:   DSM5:  Substance/Addictive Disorders:  Alcohol Related Disorder - Severe (303.90) Depressive Disorders:  Major Depressive Disorder - Severe (296.23)  AXIS I:  Alcohol Abuse, Major Depression, Recurrent severe and Substance Induced Mood Disorder AXIS II:  Cluster B Traits AXIS III:   Past Medical History  Diagnosis Date  . Hypertension   . Chronic back pain   . Alcohol abuse   . GERD (gastroesophageal reflux disease)   . Gastritis   . Atypical chest pain   . Chronic pain   . Alcoholic liver disease    AXIS IV:  other psychosocial or environmental problems and problems related to social environment AXIS V:  61-70 mild  symptoms  Treatment Plan/Recommendations:   Review of chart, vital signs, medications, and notes.  1-Medication management for depression and anxiety: Medications reviewed with the patient and she stated no untoward effects, unchanged. 2-Coping skills for depression, anxiety  3-Continue crisis stabilization and management  4-Address health issues--monitoring vital signs, stable  5-Treatment plan in progress to prevent relapse of depression and anxiety  Treatment Plan Summary: -Discharge home. Pt will follow up with outpatient resources as provided by [name] with [facility].   Current Medications:  Current Facility-Administered Medications  Medication Dose Route Frequency Provider Last Rate Last Dose  . acetaminophen (TYLENOL) tablet 650 mg  650 mg Oral Q6H PRN Lurena Nida, NP      . alum & mag hydroxide-simeth (MAALOX/MYLANTA) 200-200-20 MG/5ML suspension 30 mL  30 mL Oral Q4H PRN Lurena Nida, NP      . chlordiazePOXIDE (LIBRIUM) capsule 25 mg  25 mg Oral Q6H PRN Lurena Nida, NP   25 mg at 12/10/13 0907  . chlordiazePOXIDE (LIBRIUM) capsule 25 mg  25 mg Oral QID Lurena Nida, NP   25 mg at 12/10/13 0804   Followed by  . [START ON 12/11/2013] chlordiazePOXIDE (LIBRIUM) capsule 25 mg  25 mg Oral TID Lurena Nida, NP       Followed by  . [START ON 12/12/2013] chlordiazePOXIDE (LIBRIUM) capsule 25 mg  25 mg Oral BH-qamhs Lurena Nida, NP       Followed by  . [START ON 12/13/2013] chlordiazePOXIDE (LIBRIUM) capsule 25 mg  25 mg Oral Daily Lurena Nida, NP      . chlordiazePOXIDE (LIBRIUM) capsule 50 mg  50 mg Oral Once Lurena Nida, NP      .  hydrOXYzine (ATARAX/VISTARIL) tablet 25 mg  25 mg Oral Q6H PRN Lurena Nida, NP      . loperamide (IMODIUM) capsule 2-4 mg  2-4 mg Oral PRN Lurena Nida, NP      . magnesium hydroxide (MILK OF MAGNESIA) suspension 30 mL  30 mL Oral Daily PRN Lurena Nida, NP      . multivitamin with minerals tablet 1 tablet  1 tablet Oral Daily Lurena Nida, NP   1  tablet at 12/10/13 0804  . ondansetron (ZOFRAN-ODT) disintegrating tablet 4 mg  4 mg Oral Q6H PRN Lurena Nida, NP      . thiamine (B-1) injection 100 mg  100 mg Intramuscular Once Lurena Nida, NP      . Derrill Memo ON 12/11/2013] thiamine (VITAMIN B-1) tablet 100 mg  100 mg Oral Daily Lurena Nida, NP        Cindy, [place], FNP-BC 6/29/201510:11 AM

## 2013-12-10 NOTE — Discharge Summary (Signed)
Psychiatric supervisory and face-to-face interview and exam clarify the patient's reconstitution of sober emotional resources for coping with father's terminal brain cancer care in this community and then return to [place] area.  Chauncey MannGlenn E. Jennings, MD

## 2013-12-22 ENCOUNTER — Emergency Department (HOSPITAL_COMMUNITY)
Admission: EM | Admit: 2013-12-22 | Discharge: 2013-12-23 | Disposition: A | Discharge status: Other Acute Inpatient Hospital | Payer: Self-pay | Attending: Emergency Medicine | Admitting: Emergency Medicine

## 2013-12-22 ENCOUNTER — Encounter (HOSPITAL_COMMUNITY): Payer: Self-pay | Admitting: Emergency Medicine

## 2013-12-22 DIAGNOSIS — R112 Nausea with vomiting, unspecified: Secondary | ICD-10-CM | POA: Insufficient documentation

## 2013-12-22 DIAGNOSIS — Z791 Long term (current) use of non-steroidal anti-inflammatories (NSAID): Secondary | ICD-10-CM | POA: Insufficient documentation

## 2013-12-22 DIAGNOSIS — F1020 Alcohol dependence, uncomplicated: Secondary | ICD-10-CM | POA: Insufficient documentation

## 2013-12-22 DIAGNOSIS — Z87891 Personal history of nicotine dependence: Secondary | ICD-10-CM | POA: Insufficient documentation

## 2013-12-22 DIAGNOSIS — F1310 Sedative, hypnotic or anxiolytic abuse, uncomplicated: Secondary | ICD-10-CM | POA: Insufficient documentation

## 2013-12-22 DIAGNOSIS — Z79899 Other long term (current) drug therapy: Secondary | ICD-10-CM | POA: Insufficient documentation

## 2013-12-22 DIAGNOSIS — Z8719 Personal history of other diseases of the digestive system: Secondary | ICD-10-CM | POA: Insufficient documentation

## 2013-12-22 DIAGNOSIS — F1110 Opioid abuse, uncomplicated: Secondary | ICD-10-CM | POA: Insufficient documentation

## 2013-12-22 DIAGNOSIS — G8929 Other chronic pain: Secondary | ICD-10-CM | POA: Insufficient documentation

## 2013-12-22 DIAGNOSIS — I10 Essential (primary) hypertension: Secondary | ICD-10-CM | POA: Insufficient documentation

## 2013-12-22 LAB — BLOOD GAS, ARTERIAL
Acid-base deficit: 2.7 mmol/L — ABNORMAL HIGH (ref 0.0–2.0)
Bicarbonate: 20.8 mEq/L (ref 20.0–24.0)
Drawn by: 331471
O2 Saturation: 97.1 %
PCO2 ART: 34.2 mmHg — AB (ref 35.0–45.0)
PO2 ART: 82.7 mmHg (ref 80.0–100.0)
Patient temperature: 98.6
TCO2: 18.2 mmol/L (ref 0–100)
pH, Arterial: 7.401 (ref 7.350–7.450)

## 2013-12-22 LAB — COMPREHENSIVE METABOLIC PANEL
ALT: 20 U/L (ref 0–53)
AST: 28 U/L (ref 0–37)
Albumin: 4.6 g/dL (ref 3.5–5.2)
Alkaline Phosphatase: 71 U/L (ref 39–117)
Anion gap: 34 — ABNORMAL HIGH (ref 5–15)
BUN: 9 mg/dL (ref 6–23)
CALCIUM: 9.2 mg/dL (ref 8.4–10.5)
CO2: 14 mEq/L — ABNORMAL LOW (ref 19–32)
CREATININE: 0.8 mg/dL (ref 0.50–1.35)
Chloride: 89 mEq/L — ABNORMAL LOW (ref 96–112)
GLUCOSE: 71 mg/dL (ref 70–99)
Potassium: 3.5 mEq/L — ABNORMAL LOW (ref 3.7–5.3)
Sodium: 137 mEq/L (ref 137–147)
Total Bilirubin: 1 mg/dL (ref 0.3–1.2)
Total Protein: 8.2 g/dL (ref 6.0–8.3)

## 2013-12-22 LAB — BASIC METABOLIC PANEL
Anion gap: 19 — ABNORMAL HIGH (ref 5–15)
Anion gap: 13 (ref 5–15)
BUN: 8 mg/dL (ref 6–23)
BUN: 8 mg/dL (ref 6–23)
CALCIUM: 7.5 mg/dL — AB (ref 8.4–10.5)
CHLORIDE: 99 meq/L (ref 96–112)
CO2: 17 mEq/L — ABNORMAL LOW (ref 19–32)
CO2: 23 meq/L (ref 19–32)
CREATININE: 0.71 mg/dL (ref 0.50–1.35)
Calcium: 8.9 mg/dL (ref 8.4–10.5)
Chloride: 96 mEq/L (ref 96–112)
Creatinine, Ser: 0.76 mg/dL (ref 0.50–1.35)
GFR calc Af Amer: >90 mL/min (ref >90)
GFR calc Af Amer: >90 mL/min (ref >90)
GFR calc non Af Amer: >90 mL/min (ref >90)
GLUCOSE: 113 mg/dL — AB (ref 70–99)
Glucose, Bld: 120 mg/dL — ABNORMAL HIGH (ref 70–99)
Potassium: 4.2 mEq/L (ref 3.7–5.3)
Potassium: 3.7 mEq/L (ref 3.7–5.3)
SODIUM: 132 meq/L — AB (ref 137–147)
Sodium: 135 mEq/L — ABNORMAL LOW (ref 137–147)

## 2013-12-22 LAB — URINALYSIS, ROUTINE W REFLEX MICROSCOPIC
GLUCOSE, UA: NEGATIVE mg/dL
Hgb urine dipstick: NEGATIVE
Leukocytes, UA: NEGATIVE
Nitrite: NEGATIVE
PROTEIN: NEGATIVE mg/dL
Specific Gravity, Urine: 1.012 (ref 1.005–1.030)
Urobilinogen, UA: 1 mg/dL (ref 0.0–1.0)
pH: 6 (ref 5.0–8.0)

## 2013-12-22 LAB — I-STAT CHEM 8, ED
BUN: 4 mg/dL — ABNORMAL LOW (ref 6–23)
BUN: 5 mg/dL — ABNORMAL LOW (ref 6–23)
CALCIUM ION: 0.83 mmol/L — AB (ref 1.12–1.23)
CHLORIDE: 108 meq/L (ref 96–112)
CREATININE: 0.6 mg/dL (ref 0.50–1.35)
Calcium, Ion: 0.76 mmol/L — ABNORMAL LOW (ref 1.12–1.23)
Chloride: 106 mEq/L (ref 96–112)
Creatinine, Ser: 0.7 mg/dL (ref 0.50–1.35)
Glucose, Bld: 103 mg/dL — ABNORMAL HIGH (ref 70–99)
Glucose, Bld: 85 mg/dL (ref 70–99)
HCT: 35 % — ABNORMAL LOW (ref 39.0–52.0)
HEMATOCRIT: 36 % — AB (ref 39.0–52.0)
HEMOGLOBIN: 12.2 g/dL — AB (ref 13.0–17.0)
HEMOGLOBIN: 11.9 g/dL — AB (ref 13.0–17.0)
POTASSIUM: 3.6 meq/L — AB (ref 3.7–5.3)
Potassium: 3.8 mEq/L (ref 3.7–5.3)
SODIUM: 136 meq/L — AB (ref 137–147)
Sodium: 132 mEq/L — ABNORMAL LOW (ref 137–147)
TCO2: 8 mmol/L (ref 0–100)
TCO2: 14 mmol/L (ref 0–100)

## 2013-12-22 LAB — CBC WITH DIFFERENTIAL/PLATELET
BASOS ABS: 0 10*3/uL (ref 0.0–0.1)
Basophils Relative: 0 % (ref 0–1)
Eosinophils Absolute: 0 10*3/uL (ref 0.0–0.7)
Eosinophils Relative: 0 % (ref 0–5)
HCT: 44 % (ref 39.0–52.0)
Hemoglobin: 15.6 g/dL (ref 13.0–17.0)
LYMPHS PCT: 22 % (ref 12–46)
Lymphs Abs: 2 10*3/uL (ref 0.7–4.0)
MCH: 31.6 pg (ref 26.0–34.0)
MCHC: 35.5 g/dL (ref 30.0–36.0)
MCV: 89.1 fL (ref 78.0–100.0)
MONO ABS: 0.7 10*3/uL (ref 0.1–1.0)
Monocytes Relative: 7 % (ref 3–12)
Neutro Abs: 6.5 10*3/uL (ref 1.7–7.7)
Neutrophils Relative %: 71 % (ref 43–77)
Platelets: 144 10*3/uL — ABNORMAL LOW (ref 150–400)
RBC: 4.94 MIL/uL (ref 4.22–5.81)
RDW: 13.3 % (ref 11.5–15.5)
WBC: 9.3 10*3/uL (ref 4.0–10.5)

## 2013-12-22 LAB — RAPID URINE DRUG SCREEN, HOSP PERFORMED
Amphetamines: NOT DETECTED
Barbiturates: NOT DETECTED
Benzodiazepines: POSITIVE — AB
Cocaine: NOT DETECTED
Opiates: POSITIVE — AB
Tetrahydrocannabinol: NOT DETECTED

## 2013-12-22 LAB — ETHANOL: Alcohol, Ethyl (B): 46 mg/dL — ABNORMAL HIGH (ref 0–11)

## 2013-12-22 LAB — I-STAT CG4 LACTIC ACID, ED: Lactic Acid, Venous: 0.76 mmol/L (ref 0.5–2.2)

## 2013-12-22 LAB — LIPASE, BLOOD: LIPASE: 17 U/L (ref 11–59)

## 2013-12-22 LAB — SALICYLATE LEVEL

## 2013-12-22 LAB — ACETAMINOPHEN LEVEL

## 2013-12-22 LAB — OSMOLALITY: OSMOLALITY: 276 mosm/kg (ref 275–300)

## 2013-12-22 MED ORDER — IBUPROFEN 800 MG PO TABS
800.0000 mg | ORAL_TABLET | Freq: Four times a day (QID) | ORAL | Status: DC | PRN
  Administered 2013-12-22 (×2): 800 mg via ORAL
  Filled 2013-12-22 (×2): qty 1

## 2013-12-22 MED ORDER — MORPHINE SULFATE 4 MG/ML IJ SOLN
4.0000 mg | INTRAMUSCULAR | Status: DC | PRN
  Administered 2013-12-22 (×2): 4 mg via INTRAVENOUS
  Filled 2013-12-22 (×2): qty 1

## 2013-12-22 MED ORDER — FAMOTIDINE IN NACL 20-0.9 MG/50ML-% IV SOLN
20.0000 mg | Freq: Once | INTRAVENOUS | Status: AC
  Administered 2013-12-22: 20 mg via INTRAVENOUS
  Filled 2013-12-22: qty 50

## 2013-12-22 MED ORDER — THIAMINE HCL 100 MG/ML IJ SOLN
100.0000 mg | Freq: Every day | INTRAMUSCULAR | Status: DC
  Administered 2013-12-22: 100 mg via INTRAVENOUS
  Filled 2013-12-22: qty 2

## 2013-12-22 MED ORDER — SODIUM CHLORIDE 0.9 % IV BOLUS (SEPSIS)
1000.0000 mL | Freq: Once | INTRAVENOUS | Status: AC
  Administered 2013-12-22: 1000 mL via INTRAVENOUS

## 2013-12-22 MED ORDER — LORAZEPAM 1 MG PO TABS: 0.0000 mg | ORAL_TABLET | Freq: Two times a day (BID) | ORAL | Status: DC

## 2013-12-22 MED ORDER — PROMETHAZINE HCL 25 MG/ML IJ SOLN
12.5000 mg | Freq: Once | INTRAMUSCULAR | Status: AC
  Administered 2013-12-22: 12.5 mg via INTRAVENOUS
  Filled 2013-12-22: qty 1

## 2013-12-22 MED ORDER — LORAZEPAM 2 MG/ML IJ SOLN
0.0000 mg | Freq: Two times a day (BID) | INTRAMUSCULAR | Status: DC
  Administered 2013-12-22 (×2): 2 mg via INTRAVENOUS
  Filled 2013-12-22: qty 1

## 2013-12-22 MED ORDER — LORAZEPAM 1 MG PO TABS
0.0000 mg | ORAL_TABLET | Freq: Four times a day (QID) | ORAL | Status: DC
  Administered 2013-12-22: 1 mg via ORAL
  Filled 2013-12-22: qty 1

## 2013-12-22 MED ORDER — SODIUM CHLORIDE 0.9 % IV BOLUS (SEPSIS)
2000.0000 mL | Freq: Once | INTRAVENOUS | Status: AC
  Administered 2013-12-22: 2000 mL via INTRAVENOUS

## 2013-12-22 MED ORDER — ACETAMINOPHEN 325 MG PO TABS: 650.0000 mg | ORAL_TABLET | Freq: Four times a day (QID) | ORAL | Status: DC | PRN

## 2013-12-22 MED ORDER — SODIUM CHLORIDE 0.9 % IV BOLUS (SEPSIS): 1000.0000 mL | Freq: Once | INTRAVENOUS | Status: DC

## 2013-12-22 MED ORDER — ONDANSETRON HCL 4 MG/2ML IJ SOLN
4.0000 mg | Freq: Once | INTRAMUSCULAR | Status: AC
  Administered 2013-12-22: 4 mg via INTRAVENOUS
  Filled 2013-12-22: qty 2

## 2013-12-22 MED ORDER — ONDANSETRON HCL 4 MG/2ML IJ SOLN
4.0000 mg | Freq: Four times a day (QID) | INTRAMUSCULAR | Status: DC | PRN
  Administered 2013-12-22: 4 mg via INTRAVENOUS
  Filled 2013-12-22: qty 2

## 2013-12-22 MED ORDER — LORAZEPAM 2 MG/ML IJ SOLN
0.0000 mg | Freq: Four times a day (QID) | INTRAMUSCULAR | Status: DC
  Administered 2013-12-22: 1 mg via INTRAVENOUS
  Filled 2013-12-22 (×2): qty 1

## 2013-12-22 MED ORDER — VITAMIN B-1 100 MG PO TABS: 100.0000 mg | ORAL_TABLET | Freq: Every day | ORAL | Status: DC

## 2013-12-22 NOTE — Progress Notes (Signed)
Attempted to secure placement with the following facilites:  [facility]- faxed information [facility]- faxed information [facility]- faxed information [facility]- faxed information [facility]- [facility]- faxed information    Maryelizabeth Rowanressa Macee Venables, MSW, [place], 12/22/2013 Evening Clinical Social Worker [phone]

## 2013-12-22 NOTE — ED Notes (Signed)
Spoke with MD Silverio LayYao regarding secure ED hold placement. Waiting to hear back from MD.

## 2013-12-22 NOTE — ED Notes (Signed)
He arouses easily from comfortable sleep to tell me he feels "a lot better".  He goes to sleep soon after my assessment.  His skin is normal, warm and dry and he is breathing normally.

## 2013-12-22 NOTE — ED Provider Notes (Addendum)
Patient has CO2 of 14. After 1 L NS, bicarb was 17. Repeat AG was 19 with no osmolar gap. Called poison control, who doubt toxic alcohol ingestion, likely dehydration. Given 2 L, bicarb 23. Appears well. Lactate nl. [facility].   11:19 PM [facility] under Dr. Dub MikesLugo. Stable for transfer.   Richardean Canalavid H Yao, MD 12/22/13 2239  Richardean Canalavid H Yao, MD 12/22/13 [phone]

## 2013-12-22 NOTE — ED Notes (Signed)
Per RN labs to be drawn after bolus. RN will notify this Clinical research associatewriter when bolus completed.

## 2013-12-22 NOTE — ED Notes (Signed)
Resp called for ABG.  

## 2013-12-22 NOTE — ED Notes (Signed)
He tells me that his normal daily intake of ETOH is ~"1/2 gallon of liquor".  He states his last drink was yesterday at midday.  He tells me he has been vomiting during the night last night, and that he has upper abd. Pain and h/a.  His is oriented x 4 and in no distress.
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
## 2013-12-22 NOTE — ED Notes (Signed)
Bed: [id number] Expected date: 12/22/13 Expected time: 7:30 AM Means of arrival: Ambulance Comments: Bed 3, EMS, 48 M, n/v; ETOH, HA
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
## 2013-12-23 ENCOUNTER — Encounter (HOSPITAL_COMMUNITY): Payer: Self-pay | Admitting: *Deleted

## 2013-12-23 ENCOUNTER — Inpatient Hospital Stay (HOSPITAL_COMMUNITY)
Admission: AD | Admit: 2013-12-23 | Discharge: 2013-12-29 | DRG: 897 | Disposition: A | Discharge status: Home / Self Care | Payer: No Typology Code available for payment source | Attending: Psychiatry | Admitting: Psychiatry

## 2013-12-23 DIAGNOSIS — Z87891 Personal history of nicotine dependence: Secondary | ICD-10-CM

## 2013-12-23 DIAGNOSIS — F10230 Alcohol dependence with withdrawal, uncomplicated: Secondary | ICD-10-CM

## 2013-12-23 DIAGNOSIS — F321 Major depressive disorder, single episode, moderate: Secondary | ICD-10-CM | POA: Diagnosis present

## 2013-12-23 DIAGNOSIS — K709 Alcoholic liver disease, unspecified: Secondary | ICD-10-CM | POA: Diagnosis present

## 2013-12-23 DIAGNOSIS — Z598 Other problems related to housing and economic circumstances: Secondary | ICD-10-CM

## 2013-12-23 DIAGNOSIS — Z833 Family history of diabetes mellitus: Secondary | ICD-10-CM | POA: Diagnosis not present

## 2013-12-23 DIAGNOSIS — G8929 Other chronic pain: Secondary | ICD-10-CM | POA: Diagnosis present

## 2013-12-23 DIAGNOSIS — F10239 Alcohol dependence with withdrawal, unspecified: Secondary | ICD-10-CM | POA: Diagnosis present

## 2013-12-23 DIAGNOSIS — M549 Dorsalgia, unspecified: Secondary | ICD-10-CM | POA: Diagnosis present

## 2013-12-23 DIAGNOSIS — Z5989 Other problems related to housing and economic circumstances: Secondary | ICD-10-CM | POA: Diagnosis not present

## 2013-12-23 DIAGNOSIS — F411 Generalized anxiety disorder: Secondary | ICD-10-CM | POA: Diagnosis present

## 2013-12-23 DIAGNOSIS — I10 Essential (primary) hypertension: Secondary | ICD-10-CM | POA: Diagnosis present

## 2013-12-23 DIAGNOSIS — F1420 Cocaine dependence, uncomplicated: Secondary | ICD-10-CM | POA: Diagnosis present

## 2013-12-23 DIAGNOSIS — Z5987 Material hardship due to limited financial resources, not elsewhere classified: Secondary | ICD-10-CM

## 2013-12-23 DIAGNOSIS — K219 Gastro-esophageal reflux disease without esophagitis: Secondary | ICD-10-CM | POA: Diagnosis present

## 2013-12-23 DIAGNOSIS — F1020 Alcohol dependence, uncomplicated: Secondary | ICD-10-CM | POA: Diagnosis present

## 2013-12-23 DIAGNOSIS — G4700 Insomnia, unspecified: Secondary | ICD-10-CM | POA: Diagnosis present

## 2013-12-23 DIAGNOSIS — F10930 Alcohol use, unspecified with withdrawal, uncomplicated: Secondary | ICD-10-CM

## 2013-12-23 LAB — CBC
HEMATOCRIT: 39.8 % (ref 39.0–52.0)
HEMOGLOBIN: 13.8 g/dL (ref 13.0–17.0)
MCH: 31 pg (ref 26.0–34.0)
MCHC: 34.7 g/dL (ref 30.0–36.0)
MCV: 89.4 fL (ref 78.0–100.0)
Platelets: 58 10*3/uL — ABNORMAL LOW (ref 150–400)
RBC: 4.45 MIL/uL (ref 4.22–5.81)
RDW: 13.3 % (ref 11.5–15.5)
WBC: 6.2 10*3/uL (ref 4.0–10.5)

## 2013-12-23 LAB — HEPATIC FUNCTION PANEL
ALT: 16 U/L (ref 0–53)
AST: 25 U/L (ref 0–37)
Albumin: 3.4 g/dL — ABNORMAL LOW (ref 3.5–5.2)
Alkaline Phosphatase: 56 U/L (ref 39–117)
BILIRUBIN INDIRECT: 0.9 mg/dL (ref 0.3–0.9)
Bilirubin, Direct: 0.3 mg/dL (ref 0.0–0.3)
TOTAL PROTEIN: 6.4 g/dL (ref 6.0–8.3)
Total Bilirubin: 1.2 mg/dL (ref 0.3–1.2)

## 2013-12-23 MED ORDER — VITAMIN B-1 100 MG PO TABS
100.0000 mg | ORAL_TABLET | Freq: Every day | ORAL | Status: DC
  Administered 2013-12-24 – 2013-12-29 (×6): 100 mg via ORAL
  Filled 2013-12-23 (×10): qty 1

## 2013-12-23 MED ORDER — ADULT MULTIVITAMIN W/MINERALS CH
1.0000 | ORAL_TABLET | Freq: Every day | ORAL | Status: DC
  Administered 2013-12-23 – 2013-12-29 (×7): 1 via ORAL
  Filled 2013-12-23 (×10): qty 1

## 2013-12-23 MED ORDER — ACETAMINOPHEN 325 MG PO TABS
650.0000 mg | ORAL_TABLET | Freq: Four times a day (QID) | ORAL | Status: DC | PRN
  Administered 2013-12-23: 650 mg via ORAL
  Filled 2013-12-23: qty 2

## 2013-12-23 MED ORDER — LORAZEPAM 1 MG PO TABS
1.0000 mg | ORAL_TABLET | Freq: Every day | ORAL | Status: DC
Start: 2013-12-27 — End: 2013-12-26

## 2013-12-23 MED ORDER — ONDANSETRON 4 MG PO TBDP
4.0000 mg | ORAL_TABLET | Freq: Four times a day (QID) | ORAL | Status: AC | PRN
  Administered 2013-12-23: 4 mg via ORAL
  Filled 2013-12-23: qty 1

## 2013-12-23 MED ORDER — MAGNESIUM HYDROXIDE 400 MG/5ML PO SUSP: 30.0000 mL | Freq: Every day | ORAL | Status: DC | PRN

## 2013-12-23 MED ORDER — LORAZEPAM 1 MG PO TABS
1.0000 mg | ORAL_TABLET | Freq: Two times a day (BID) | ORAL | Status: AC
  Administered 2013-12-25 – 2013-12-26 (×2): 1 mg via ORAL
  Filled 2013-12-23 (×2): qty 1

## 2013-12-23 MED ORDER — LOPERAMIDE HCL 2 MG PO CAPS
2.0000 mg | ORAL_CAPSULE | ORAL | Status: AC | PRN
  Administered 2013-12-23 – 2013-12-24 (×4): 2 mg via ORAL
  Filled 2013-12-23 (×4): qty 1

## 2013-12-23 MED ORDER — TRAZODONE HCL 50 MG PO TABS
50.0000 mg | ORAL_TABLET | Freq: Every evening | ORAL | Status: DC | PRN
  Administered 2013-12-23 – 2013-12-28 (×6): 50 mg via ORAL
  Filled 2013-12-23 (×2): qty 1
  Filled 2013-12-23: qty 14
  Filled 2013-12-23 (×4): qty 1

## 2013-12-23 MED ORDER — THIAMINE HCL 100 MG/ML IJ SOLN: 100.0000 mg | Freq: Once | INTRAMUSCULAR | Status: DC

## 2013-12-23 MED ORDER — LORAZEPAM 1 MG PO TABS
1.0000 mg | ORAL_TABLET | Freq: Three times a day (TID) | ORAL | Status: AC
  Administered 2013-12-24 – 2013-12-25 (×3): 1 mg via ORAL
  Filled 2013-12-23 (×3): qty 1

## 2013-12-23 MED ORDER — LORAZEPAM 1 MG PO TABS
1.0000 mg | ORAL_TABLET | Freq: Four times a day (QID) | ORAL | Status: AC
  Administered 2013-12-23 – 2013-12-24 (×6): 1 mg via ORAL
  Filled 2013-12-23 (×6): qty 1

## 2013-12-23 MED ORDER — HYDROXYZINE HCL 25 MG PO TABS
25.0000 mg | ORAL_TABLET | Freq: Four times a day (QID) | ORAL | Status: DC | PRN
  Administered 2013-12-23 – 2013-12-25 (×9): 25 mg via ORAL
  Filled 2013-12-23 (×9): qty 1

## 2013-12-23 MED ORDER — LORAZEPAM 1 MG PO TABS
1.0000 mg | ORAL_TABLET | Freq: Four times a day (QID) | ORAL | Status: AC | PRN
  Administered 2013-12-23 – 2013-12-24 (×2): 1 mg via ORAL
  Filled 2013-12-23 (×2): qty 1

## 2013-12-23 MED ORDER — ALUM & MAG HYDROXIDE-SIMETH 200-200-20 MG/5ML PO SUSP: 30.0000 mL | ORAL | Status: DC | PRN

## 2013-12-23 NOTE — Progress Notes (Signed)
Patient did attend the evening speaker AA meeting.  

## 2013-12-23 NOTE — [facility] Group Notes (Signed)
[facility] Group Notes: (Clinical Social Work)   12/23/2013      Type of Therapy:  Group Therapy   Participation Level:  Did Not Attend    Ambrose MantleMareida Grossman-Orr, LCSW 12/23/2013, 12:23 PM

## 2013-12-23 NOTE — [facility] Group Notes (Signed)
[facility] Group Notes:  (Nursing/MHT/Case Management/Adjunct)  Date:  12/23/2013  Time:  4:18 PM  Type of Therapy:  Psychoeducational Skills  Participation Level:  Did Not Attend  Erick Oxendine Shanta 12/23/2013, 4:18 PM 
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
## 2013-12-23 NOTE — [facility] Group Notes (Signed)
[facility] Group Notes:  (Nursing/MHT/Case Management/Adjunct)  Date:  12/23/2013  Time:  3:58 PM  Type of Therapy:  Psychoeducational Skills  Participation Level:  Did Not Attend  Buford DresserForrest, Dashonda Bonneau Shanta 12/23/2013, 3:58 PM

## 2013-12-23 NOTE — [facility] Suicide Risk Assessment (Signed)
   Nursing information obtained from:  Patient Demographic factors:  Male;Caucasian;Low socioeconomic status Current Mental Status:  NA Loss Factors:  Financial problems / change in socioeconomic status Historical Factors:  Family history of mental illness or substance abuse Risk Reduction Factors:  NA Total Time spent with patient: 20 minutes  CLINICAL FACTORS:   Alcohol/Substance Abuse/Dependencies  Psychiatric Specialty Exam: Physical Exam  Review of Systems  Constitutional: Positive for malaise/fatigue. Negative for fever and chills.  Respiratory: Negative for cough.   Cardiovascular: Negative for chest pain.  Gastrointestinal: Negative for nausea and vomiting.  Neurological: Negative for dizziness and tremors.  Psychiatric/Behavioral: Positive for depression. Negative for hallucinations and substance abuse. The patient does not have insomnia.     Blood pressure 120/69, pulse 106, temperature 98.2 F (36.8 C), temperature source Oral, resp. rate 16, height 5' 6.5" (1.689 m), weight 92.08 kg (203 lb).Body mass index is 32.28 kg/(m^2).  General Appearance: Disheveled  Eye Contact::  poor, pt was falling asleep while answering questions  Speech:  Slow and miminal in content  Volume:  Decreased  Mood:  Depressed  Affect:  Congruent  Thought Process:  Intact  Orientation:  Full (Time, Place, and Person)  Thought Content:  WDL  Suicidal Thoughts:  No  Homicidal Thoughts:  No  Memory:  Immediate;   Fair Recent;   Poor Remote;   Poor  Judgement:  Intact  Insight:  Present  Psychomotor Activity:  Normal  Concentration:  Fair  Recall:  [employer]  Fund of Knowledge:Fair  Language: Fair  Akathisia:  No  Handed:  Right  AIMS (if indicated):     Assets:  Desire for Improvement  Sleep:  Number of Hours: 3.25   Musculoskeletal: Strength & Muscle Tone: within normal limits Gait & Station: normal Patient leans: N/A  COGNITIVE FEATURES THAT CONTRIBUTE TO RISK:  n/a  SUICIDE  RISK:   Minimal: No identifiable suicidal ideation.  Patients presenting with no risk factors but with morbid ruminations; may be classified as minimal risk based on the severity of the depressive symptoms  PLAN OF CARE:  I certify that inpatient services furnished can reasonably be expected to improve the patient's condition.  Oletta DarterGARWAL, Aundray Cartlidge 12/23/2013, 8:58 AM
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
## 2013-12-23 NOTE — Progress Notes (Signed)
[facility] Group Notes:  (Nursing/MHT/Case Management/Adjunct)  Date:  12/23/2013  Time:  6:33 PM  Type of Therapy:  Psychoeducational Skills  Participation Level:  Did not attend  Modes of Intervention:  Activity  Summary of Progress/Problems: Pts played a game of Pictionary using coping skills.  Amberle Lyter C 12/23/2013, 6:33 PM 

## 2013-12-23 NOTE — Progress Notes (Signed)
Admission note:  Pt is a 48 year old Caucasion male admitted to the services of Dr. Dub MikesLugo for alcohol detox.  Pt had been drinking 1/5 of vodka daily for the last several weeks.  Pt states he has been drinking heavily off and on for years.  Pt states longest period sober was a year ago and lasted for eight months.  Pt has been staying at an [address] and with friends at the [place] but has been in [place] for three weeks due to his father's terminal cancer.  Pt reports no major medical problems at this time.  Pt has minimal s/s of withdrawal upon admission.  He was in the OBS unit last week.  Pt is cooperative with admission process, given meal, and oriented to unit.

## 2013-12-23 NOTE — Progress Notes (Signed)
Patient ID: Brent Gay, male   DOB: 05/05/1966, 48 y.o.   MRN: [id number]   D: Pt has been very flat and depressed on the unit today, he did not attend any groups nor did he engage in treatment. Pt slept most of the day, he only woke up to take medications. Pt complained of some diarrhea, he reported that he had been having loose stools since [facility]. Pt reported diarrhea at 1852, he was given Imodium 2mg .  Pt reported being negative SI/HI, no AH/VH noted. A: 15 min checks continued for patient safety. R: Pt safety maintained.

## 2013-12-23 NOTE — H&P (Signed)
I agree with the assessment and plan.

## 2013-12-23 NOTE — Progress Notes (Signed)
D.  Pt pleasant and bright on approach, no further complaint of diarrhea but did wish to take one imodium before bed to be sure of no "accidents".  Denies SI/HI/hallucinations at this time.  Positive for evening AA group, interacting appropriately with peers on unit.  Played cards with peers until bedtime.  A.  Support and encouragement offered, medication given as ordered  R.  Pt remains safe on unit, will continue to monitor.

## 2013-12-24 DIAGNOSIS — F1994 Other psychoactive substance use, unspecified with psychoactive substance-induced mood disorder: Secondary | ICD-10-CM

## 2013-12-24 MED ORDER — BUSPIRONE HCL 10 MG PO TABS
10.0000 mg | ORAL_TABLET | Freq: Three times a day (TID) | ORAL | Status: DC
  Administered 2013-12-24 – 2013-12-25 (×3): 10 mg via ORAL
  Filled 2013-12-24: qty 1
  Filled 2013-12-24: qty 2
  Filled 2013-12-24 (×3): qty 1
  Filled 2013-12-24: qty 2
  Filled 2013-12-24: qty 1

## 2013-12-24 MED ORDER — GABAPENTIN 100 MG PO CAPS
100.0000 mg | ORAL_CAPSULE | Freq: Three times a day (TID) | ORAL | Status: DC
  Administered 2013-12-24 – 2013-12-25 (×3): 100 mg via ORAL
  Filled 2013-12-24 (×7): qty 1

## 2013-12-24 NOTE — Progress Notes (Signed)
Adult Psychoeducational Group Note  Date:  12/24/2013 Time:  9:59 PM  Group Topic/Focus:  Wrap-Up Group:   The focus of this group is to help patients review their daily goal of treatment and discuss progress on daily workbooks.  Participation Level:  Active  Participation Quality:  Appropriate  Affect:  Flat  Cognitive:  Appropriate  Insight: Limited  Engagement in Group:  Engaged  Modes of Intervention:  Support  Additional Comments:  Pt stated that he had a bad day today and that with his detox, his dad getting worse and his sponsors son ODing and on life support it has been hard but that his goal for tomorrow is to have a better day. Pt was given support from Clinical research associatewriter as well as his peers  Marlet Korte 12/24/2013, 9:59 PM

## 2013-12-24 NOTE — Progress Notes (Addendum)
[facility] MD Progress Note  12/24/2013 3:56 PM Brent Gay  MRN:  [id number] Subjective:  States he was doing reasonably well until his father got sick. He was diagnosed with a brain tumor and states there is not much that can be done for him. He is Hospice care. States he is very close to his dad and he could not handle it. He got very depressed, relapsed and could not control his drinking. The more he drank, the more depressed he got Diagnosis:   DSM5: Schizophrenia Disorders: none Obsessive-Compulsive Disorders:  none Trauma-Stressor Disorders:  none Substance/Addictive Disorders:  Alcohol Related Disorder - Severe (303.90), Cocaine related disorder Depressive Disorders:  Major Depressive Disorder - Moderate (296.22) Total Time spent with patient: 30 minutes  Axis I: Substance Induced Mood Disorder  ADL's:  Intact  Sleep: Fair  Appetite:  Fair  Suicidal Ideation:  Plan:  denies Intent:  denies Means:  denies Homicidal Ideation:  Plan:  denies Intent:  denies Means:  denies AEB (as evidenced by):  Psychiatric Specialty Exam: Physical Exam  Review of Systems  Constitutional: Negative.   HENT: Negative.   Eyes: Negative.   Respiratory: Negative.   Cardiovascular: Negative.   Gastrointestinal: Negative.   Genitourinary: Negative.   Musculoskeletal: Negative.   Skin: Negative.   Neurological: Negative.   Endo/Heme/Allergies: Negative.   Psychiatric/Behavioral: Positive for depression and substance abuse. The patient is nervous/anxious.     Blood pressure 127/79, pulse 90, temperature 97.9 F (36.6 C), temperature source Oral, resp. rate 16, height 5' 6.5" (1.689 m), weight 92.08 kg (203 lb).Body mass index is 32.28 kg/(m^2).  General Appearance: Fairly Groomed  Patent attorney::  Fair  Speech:  Clear and Coherent and not spontaneous  Volume:  Decreased  Mood:  Anxious and Depressed  Affect:  Restricted  Thought Process:  Coherent and Goal Directed  Orientation:  Full  (Time, Place, and Person)  Thought Content:  symptoms events worries concerns anticipation of the death of his fathr  Suicidal Thoughts:  No  Homicidal Thoughts:  No  Memory:  Immediate;   Fair Recent;   Fair Remote;   Fair  Judgement:  Fair  Insight:  Present  Psychomotor Activity:  Restlessness  Concentration:  Fair  Recall:  [employer] of Knowledge:NA  Language: Fair  Akathisia:  No  Handed:    AIMS (if indicated):     Assets:  Desire for Improvement  Sleep:  Number of Hours: 5.75   Musculoskeletal: Strength & Muscle Tone: within normal limits Gait & Station: normal Patient leans: N/A  Current Medications: Current Facility-Administered Medications  Medication Dose Route Frequency Provider Last Rate Last Dose  . acetaminophen (TYLENOL) tablet 650 mg  650 mg Oral Q6H PRN Oletta Darter, MD   650 mg at 12/23/13 2257  . alum & mag hydroxide-simeth (MAALOX/MYLANTA) 200-200-20 MG/5ML suspension 30 mL  30 mL Oral Q4H PRN Oletta Darter, MD      . hydrOXYzine (ATARAX/VISTARIL) tablet 25 mg  25 mg Oral Q6H PRN Oletta Darter, MD   25 mg at 12/24/13 0952  . loperamide (IMODIUM) capsule 2-4 mg  2-4 mg Oral PRN Oletta Darter, MD   2 mg at 12/24/13 1205  . LORazepam (ATIVAN) tablet 1 mg  1 mg Oral Q6H PRN Oletta Darter, MD   1 mg at 12/23/13 0148  . LORazepam (ATIVAN) tablet 1 mg  1 mg Oral TID Oletta Darter, MD       Followed by  . [START ON 12/25/2013] LORazepam (  ATIVAN) tablet 1 mg  1 mg Oral BID Oletta DarterSalina Agarwal, MD       Followed by  . [START ON 12/27/2013] LORazepam (ATIVAN) tablet 1 mg  1 mg Oral Daily Oletta DarterSalina Agarwal, MD      . magnesium hydroxide (MILK OF MAGNESIA) suspension 30 mL  30 mL Oral Daily PRN Oletta DarterSalina Agarwal, MD      . multivitamin with minerals tablet 1 tablet  1 tablet Oral Daily Oletta DarterSalina Agarwal, MD   1 tablet at 12/24/13 0830  . ondansetron (ZOFRAN-ODT) disintegrating tablet 4 mg  4 mg Oral Q6H PRN Oletta DarterSalina Agarwal, MD   4 mg at 12/23/13 0148  . thiamine (B-1)  injection 100 mg  100 mg Intramuscular Once Oletta DarterSalina Agarwal, MD      . thiamine (VITAMIN B-1) tablet 100 mg  100 mg Oral Daily Oletta DarterSalina Agarwal, MD   100 mg at 12/24/13 0830  . traZODone (DESYREL) tablet 50 mg  50 mg Oral QHS PRN Kristeen MansFran E Hobson, NP   50 mg at 12/23/13 2220    Lab Results:  Results for orders placed during the hospital encounter of 12/23/13 (from the past 48 hour(s))  HEPATIC FUNCTION PANEL     Status: Abnormal   Collection Time    12/23/13  6:30 AM      Result Value Ref Range   Total Protein 6.4  6.0 - 8.3 g/dL   Albumin 3.4 (*) 3.5 - 5.2 g/dL   AST 25  0 - 37 U/L   ALT 16  0 - 53 U/L   Alkaline Phosphatase 56  39 - 117 U/L   Total Bilirubin 1.2  0.3 - 1.2 mg/dL   Bilirubin, Direct 0.3  0.0 - 0.3 mg/dL   Indirect Bilirubin 0.9  0.3 - 0.9 mg/dL   Comment: Performed at [facility]  CBC     Status: Abnormal   Collection Time    12/23/13  6:30 AM      Result Value Ref Range   WBC 6.2  4.0 - 10.5 K/uL   RBC 4.45  4.22 - 5.81 MIL/uL   Hemoglobin 13.8  13.0 - 17.0 g/dL   HCT 16.139.8  09.639.0 - 04.552.0 %   MCV 89.4  78.0 - 100.0 fL   MCH 31.0  26.0 - 34.0 pg   MCHC 34.7  30.0 - 36.0 g/dL   RDW 40.913.3  81.111.5 - 91.415.5 %   Platelets 58 (*) 150 - 400 K/uL   Comment: SPECIMEN CHECKED FOR CLOTS     RESULT REPEATED AND VERIFIED     PLATELET CLUMPS NOTED ON SMEAR     Performed at [facility]    Physical Findings: AIMS:  , ,  ,  ,    CIWA:  CIWA-Ar Total: 4 COWS:     Treatment Plan Summary: Daily contact with patient to assess and evaluate symptoms and progress in treatment Medication management  Plan: Supportive approach/coping skills/relapse prevention           Complete the detox           Reassess and optimize response to psychotropic           Address the grief-loss issues           States he was taking Buspar/Neurontin what he found useful. Will resume Medical Decision Making Problem Points:  Review of psycho-social stressors (1) Data  Points:  Review of medication regiment & side effects (2)  I certify that  inpatient services furnished can reasonably be expected to improve the patient's condition.   Kenley Rettinger A 12/24/2013, 3:56 PM

## 2013-12-24 NOTE — [facility] Group Notes (Signed)
[facility] L[facility] Dis[facility] Planning [facility] Note   12/24/2013 10:09 AM  Participation Quality:  Appropriate   Mood/Affect:  Depressed and Flat  Depression Rating:  7  Anxiety Rating:  7  Thoughts of Suicide:  No Will you contract for safety?   NA  Current AVH:  No  Plan for Discharge/Comments:  Pt reports that he has been living in [place] with his dad for past month, being that his father is dying of brain tumor. Pt plans [facility] until his father dies and then [place] Pt provided [place] Mental health Association and [place].   Transportation Means: bus   Supports: father/limited family supports   Counselling psychologistmart, OncologistHeather LCSWA

## 2013-12-24 NOTE — Progress Notes (Signed)
D: Pt presents with the following withdrawal symptoms: anxiety, mild tremors, and a headache at level 9/10. Pt is declining tylenol due to his liver function. He is requesting Relafen. Pt is currently denying any SI/HI/AVH. Pt observed in the dayroom interacting with the other patients. Pt actively participates within the milieu.  A: Writer administered Ativan, Vistaril, and Trazodone, per MD orders. Continued support and availability as needed was extended to this pt. Staff continue to monitor pt with q8515min checks. On-call extender provider contacted for pt's request. Pt's AST and ALT within defined limits. On-call Extender has informed Clinical research associatewriter for Tylenol as the best drug of choice for this pt at this time.  R: No adverse drug reactions noted. Pt receptive to treatment. Pt remains safe at this time. Pt adds tylenol to his allergy list after his request for Relafen was declined. He is declining this Clinical research associatewriter' offer for a cold pack to help alleviate his headache. Pt reports that he may try it later.

## 2013-12-24 NOTE — [facility] Group Notes (Signed)
[facility] LCSW Group Therapy  12/24/2013 2:53 PM  Type of Therapy:  Group Therapy  Participation Level:  Active  Participation Quality:  Attentive  Affect:  Appropriate  Cognitive:  Alert and Oriented  Insight:  Improving  Engagement in Therapy:  Improving  Modes of Intervention:  Confrontation, Discussion, Education, Exploration, Problem-solving, Rapport Building, Socialization and Support  Summary of Progress/Problems: Today's Topic: Overcoming Obstacles. Pt identified obstacles faced currently and processed barriers involved in overcoming these obstacles. Pt identified steps necessary for overcoming these obstacles and explored motivation (internal and external) for facing these difficulties head on. Pt further identified one area of concern in their lives and chose a skill of focus pulled from their "toolbox." Jonny RuizJohn was attentive and engaged throughout today's therapy group. He shared that his biggest obstacle involves learning how to cope with his father's terminal illness and nearing death without turning to alcohol. Amaro continues to show progress in the group setting and improving insight AEB his ability to process how going to AA, attending mental health support groups, and working through grief cycle will help him prepare for this loss and avoid relapse. Pt encouraged to attend tomorrow's group where speaker from [facility] comes to speak and learn about free mental health group/peer support resources in [place].    Smart, Ashten Prats LCSWA  12/24/2013, 2:53 PM

## 2013-12-24 NOTE — [facility] Suicide Risk Assessment (Signed)
[facility] INPATIENT: Family/Significant Other Suicide Prevention Education  Suicide Prevention Education:  Education Completed; No one has been identified by the patient as the family member/significant other with whom the patient will be residing, and identified as the person(s) who will aid the patient in the event of a mental health crisis (suicidal ideations/suicide attempt).   Pt did not c/o SI at admission, nor have they endorsed SI during their stay here. SPE not required. SPI pamphlet provided to pt and he was encouraged to share information with support network, ask questions, and talk about any concerns relating to SPE.  The Sherwin-WilliamsHeather Smart, LCSWA  12/24/2013 10:44 AM

## 2013-12-24 NOTE — Progress Notes (Signed)
Patient ID: Milus HeightJohn F Mckesson, male   DOB: 11/27/1965, 48 y.o.   MRN: [id number] He has been up and about  And to groups interacting with peers and staff . Has rerquested medication for diarrhea and anxiety that has helped. Self inventory: depression 7, hopelessness 5, withdrawals of diarrhea, anxiety agitation and chilling.  Self inventory: depression 7, hopelessness 5, withdrawals of diarrhea, agitation, chilling and he denies SI thoughts.

## 2013-12-25 DIAGNOSIS — F411 Generalized anxiety disorder: Secondary | ICD-10-CM | POA: Diagnosis present

## 2013-12-25 MED ORDER — BUSPIRONE HCL 15 MG PO TABS
15.0000 mg | ORAL_TABLET | Freq: Three times a day (TID) | ORAL | Status: DC
  Administered 2013-12-25 – 2013-12-29 (×12): 15 mg via ORAL
  Filled 2013-12-25: qty 42
  Filled 2013-12-25: qty 1
  Filled 2013-12-25: qty 3
  Filled 2013-12-25: qty 42
  Filled 2013-12-25: qty 1
  Filled 2013-12-25: qty 42
  Filled 2013-12-25 (×2): qty 1
  Filled 2013-12-25 (×2): qty 42
  Filled 2013-12-25: qty 1
  Filled 2013-12-25: qty 42
  Filled 2013-12-25 (×3): qty 1
  Filled 2013-12-25 (×3): qty 42
  Filled 2013-12-25 (×3): qty 1

## 2013-12-25 MED ORDER — NABUMETONE 750 MG PO TABS
750.0000 mg | ORAL_TABLET | Freq: Two times a day (BID) | ORAL | Status: DC
  Administered 2013-12-25 – 2013-12-28 (×6): 750 mg via ORAL
  Filled 2013-12-25 (×3): qty 1
  Filled 2013-12-25: qty 14
  Filled 2013-12-25: qty 1
  Filled 2013-12-25 (×2): qty 14
  Filled 2013-12-25 (×5): qty 1
  Filled 2013-12-25: qty 14

## 2013-12-25 MED ORDER — NABUMETONE 750 MG PO TABS: 750.0000 mg | ORAL_TABLET | Freq: Two times a day (BID) | ORAL | Status: DC

## 2013-12-25 MED ORDER — GABAPENTIN 300 MG PO CAPS
300.0000 mg | ORAL_CAPSULE | Freq: Once | ORAL | Status: AC
  Administered 2013-12-25: 300 mg via ORAL
  Filled 2013-12-25: qty 1

## 2013-12-25 MED ORDER — GABAPENTIN 300 MG PO CAPS
300.0000 mg | ORAL_CAPSULE | Freq: Three times a day (TID) | ORAL | Status: DC
Start: 2013-12-25 — End: 2013-12-26
  Administered 2013-12-25 – 2013-12-26 (×2): 300 mg via ORAL
  Filled 2013-12-25 (×5): qty 1

## 2013-12-25 MED ORDER — NALTREXONE HCL 50 MG PO TABS
25.0000 mg | ORAL_TABLET | Freq: Every day | ORAL | Status: DC
  Administered 2013-12-25 – 2013-12-26 (×2): 25 mg via ORAL
  Filled 2013-12-25 (×4): qty 1

## 2013-12-25 MED ORDER — HYDROXYZINE HCL 50 MG PO TABS
50.0000 mg | ORAL_TABLET | Freq: Four times a day (QID) | ORAL | Status: AC | PRN
  Administered 2013-12-25 (×2): 50 mg via ORAL
  Filled 2013-12-25 (×2): qty 1

## 2013-12-25 NOTE — Progress Notes (Signed)
Patient ID: Brent HeightJohn F Gay, male   DOB: 07/03/1965, 48 y.o.   MRN: [id number] He stayed in bed this morning  c/o back pain and he refused offer of prn tylenol in bed.. Standing order wrote and given later in day and he has been up and to groups this afternoon. Self inventory: at 1300. Depression 7, hopeless 5, withdrawal of agitation chilling anxiety tremors and he denies SI thoughts.

## 2013-12-25 NOTE — [facility] Group Notes (Signed)
[facility] Group Notes:  (Nursing/MHT/Case Management/Adjunct)  Date:  12/25/2013  Time:  0845  Type of Therapy:  Psychoeducational Skills  Participation Level:  Active  Participation Quality:  Appropriate  Affect:  Appropriate  Cognitive:  Alert and Appropriate  Insight:  Appropriate  Engagement in Group:  Engaged and Supportive  Modes of Intervention:  Clarification and Support  Summary of Progress/Problems:Morning wellness group; Sleep Hygiene    Manuela Schwartzritchett, Micaiah Litle [facility] 12/25/2013, 12:47 PM

## 2013-12-25 NOTE — Progress Notes (Signed)
Patient attended AA meeting. Tonight we had a speaker meeting. They actively listened. Rosilyn MingsMingia, Makila Colombe A 10:27 PM

## 2013-12-25 NOTE — Progress Notes (Signed)
Recreation Therapy Notes  Animal-Assisted Activity/Therapy (AAA/T) Program Checklist/Progress Notes Patient Eligibility Criteria Checklist & Daily Group note for Rec Tx Intervention  Date: 07.14.2015 Time: 2:45pm Location: [address] Dayroom    AAA/T Program Assumption of Risk Form signed by Patient/ or Parent Legal Guardian yes  Patient is free of allergies or sever asthma yes  Patient reports no fear of animals yes  Patient reports no history of cruelty to animals yes   Patient understands his/her participation is voluntary yes  Patient washes hands before animal contact yes  Patient washes hands after animal contact yes  Behavioral Response: Engaged, Appropriate   Education: Hand Washing, Appropriate Animal Interaction   Education Outcome: Acknowledges understanding  Clinical Observations/Feedback: Patient engaged appropriately with therapy dog and peers in session.   Eleonore Shippee Gay [name], LRT/CTRS  Brent Gay 12/25/2013 5:01 PM 

## 2013-12-25 NOTE — Tx Team (Signed)
Interdisciplinary Treatment Plan Update (Adult)  Date: 12/25/2013   Time Reviewed: 11:02 AM  Progress in Treatment:  Attending groups: yes  Participating in groups:  Yes  Taking medication as prescribed: Yes  Tolerating medication: Yes  Family/Significant othe contact made: No. SPE not required for this pt.   Patient understands diagnosis: Yes, AEB seeking treatment for ETOH detox, mood stabilization, and med management. Discussing patient identified problems/goals with staff: Yes  Medical problems stabilized or resolved: Yes  Denies suicidal/homicidal ideation: Yes during admission, self report.  Patient has not harmed self or Others: Yes  New problem(s) identified:  Discharge Plan or Barriers: Pt plans to return home/with father/friends and [facility] and assessment for therapy services. Pt provided with AA list and [facility] info.  Additional comments: Male, 48 years old was admitted yesterday evening for Alcohol Dependence. This is one of several detox treatment for patient who was discharged from our inpatient Chemical dependency unit after detox from alcohol on June 29 th. Patient states he relapsed Same day he left. Patient drank last on Friday and he drinks a bottle of Vodka a day. Patient reports he is under a lot of stress due to his father dying of brain tumor and is in Hospice. Patient want to the ER yesterday with the complaint of N/V after drinking. Patient reports he feel depressed and is not on any medications. Patient reports feeling hopeless, helpless and worthless. Patient reports he started drinking at age 89 and that he drinks to self treat depression. Patient reports his longest period of sobriety is 8 months. He reports poor sleep and poor appetite. [place] he want to be in this area to see to his father's health. He denies SI/HI/AVH. We are using our Ativan protocol for his detoxification treatment. We will  address his depression when patient is sober and stable. Reason for Continuation of Hospitalization: Mood stabilization Med management  Ativan taper-withdrawals Estimated length of stay: 1-2 days (tentative d/c scheduled for wed)  For review of initial/current patient goals, please see plan of care.  Attendees:  Patient:    Family:    Physician: Geoffery LyonsIrving Lugo MD  12/25/2013 11:01 AM   Nursing: Lupita Leashonna RN  12/25/2013 11:01 AM   Clinical Social Worker [employer] Sherwin-WilliamsHeather Smart, LCSWA  12/25/2013 11:01 AM   Other: Meryl DareJennifer P. RN 12/25/2013 11:01 AM   Other: Chandra BatchAggie N. PA 12/25/2013 11:01 AM   Other: Darden DatesJennifer C. Nurse CM 12/25/2013 11:01 AM   Other:    Scribe for Treatment Team:  Herbert SetaHeather Smart LCSWA 12/25/2013 11:01 AM

## 2013-12-25 NOTE — [facility] Group Notes (Signed)
[facility] LCSW Group Therapy  12/25/2013 2:58 PM  Type of Therapy:  Group Therapy  Participation Level:  Minimal  Participation Quality:  Drowsy  Affect:  Flat  Cognitive:  Lacking  Insight:  Limited  Engagement in Therapy:  Limited  Modes of Intervention:  Confrontation, Discussion, Education, Exploration, Problem-solving, Rapport Building, Socialization and Support  Summary of Progress/Problems: [facility] Speaker came to talk about his personal journey with substance abuse and addiction. The pt processed ways by which to relate to the speaker. [facility] speaker provided handouts and educational information pertaining to groups and services offered by the [facility].    Smart, Brent Gay LCSWA 12/25/2013, 2:58 PM

## 2013-12-25 NOTE — Progress Notes (Signed)
[facility] Progress [facility]  12/25/2013 7:00 PM Brent HeightJohn F Gay  MRN:  [id number] Subjective:  Brent RuizJohn continues to endorse anxiety. States he was taking higher dosages of Buspar, Neurontin. He is concerned about the anxiety being a trigger for a relapse. He has been depending on the Ativan PRN for anxiety rather than withdrawal symptoms. Continues to deal with the imminent death of his father Diagnosis:   DSM5: Schizophrenia Disorders:  none Obsessive-Compulsive Disorders:  none Trauma-Stressor Disorders:  none Substance/Addictive Disorders:  Alcohol Related Disorder - Severe (303.90), cocaine related disorder Depressive Disorders:  Major Depressive Disorder - Moderate (296.22) Total Time spent with patient: 30 minutes  Axis I: Generalized Anxiety Disorder  ADL's:  Intact  Sleep: Poor  Appetite:  Fair  Suicidal Ideation:  Plan:  denies Intent:  denies Means:  denies Homicidal Ideation:  Plan:  denies Intent:  denies Means:  denies AEB (as evidenced by):  Psychiatric Specialty Exam: Physical Exam  Review of Systems  Constitutional: Negative.   HENT: Negative.   Eyes: Negative.   Respiratory: Negative.   Cardiovascular: Negative.   Gastrointestinal: Negative.   Genitourinary: Negative.   Musculoskeletal: Positive for back pain.  Skin: Negative.   Neurological: Negative.   Endo/Heme/Allergies: Negative.   Psychiatric/Behavioral: Positive for substance abuse. The patient is nervous/anxious and has insomnia.     Blood pressure 123/78, pulse 79, temperature 97.8 F (36.6 C), temperature source Oral, resp. rate 20, Gay 5' 6.5" (1.689 m), weight 92.08 kg (203 lb).Body mass index is 32.28 kg/(m^2).  General Appearance: Fairly Groomed  Patent attorneyye Contact::  Fair  Speech:  Clear and Coherent  Volume:  Decreased  Mood:  Anxious and worried  Affect:  anxious, worried  Thought Process:  Coherent and Goal Directed  Orientation:  Full (Time, Place, and Person)  Thought Content:  worries,  concerns, symptoms  Suicidal Thoughts:  No  Homicidal Thoughts:  No  Memory:  Immediate;   Fair Recent;   Fair Remote;   Fair  Judgement:  Fair  Insight:  Present  Psychomotor Activity:  Restlessness  Concentration:  Fair  Recall:  Fis[employer]Fair  Fund of Knowledge:NA  Language: Fair  Akathisia:  No  Handed:    AIMS (if indicated):     Assets:  Desire for Improvement  Sleep:  Number of Hours: 5   Musculoskeletal: Strength & Muscle Tone: within normal limits Gait & Station: normal Patient leans: N/A  Current Medications: Current Facility-Administered Medications  Medication Dose Route Frequency Provider Last Rate Last Dose  . acetaminophen (TYLENOL) tablet 650 mg  650 mg Oral Q6H PRN Oletta DarterSalina Agarwal, MD   650 mg at 12/23/13 2257  . alum & mag hydroxide-simeth (MAALOX/MYLANTA) 200-200-20 MG/5ML suspension 30 mL  30 mL Oral Q4H PRN Oletta DarterSalina Agarwal, MD      . busPIRone (BUSPAR) tablet 15 mg  15 mg Oral TID Rachael FeeIrving A Eman Morimoto, MD   15 mg at 12/25/13 1656  . gabapentin (NEURONTIN) capsule 300 mg  300 mg Oral TID Rachael FeeIrving A Jacion Dismore, MD   300 mg at 12/25/13 1656  . hydrOXYzine (ATARAX/VISTARIL) tablet 50 mg  50 mg Oral Q6H PRN Rachael FeeIrving A Madia Carvell, MD   50 mg at 12/25/13 1659  . loperamide (IMODIUM) capsule 2-4 mg  2-4 mg Oral PRN Oletta DarterSalina Agarwal, MD   2 mg at 12/24/13 1205  . LORazepam (ATIVAN) tablet 1 mg  1 mg Oral Q6H PRN Oletta DarterSalina Agarwal, MD   1 mg at 12/24/13 2146  . LORazepam (ATIVAN) tablet 1 mg  1 mg Oral BID Oletta Darter, MD   1 mg at 12/25/13 1656   Followed by  . [START ON 12/27/2013] LORazepam (ATIVAN) tablet 1 mg  1 mg Oral Daily Oletta Darter, MD      . magnesium hydroxide (MILK OF MAGNESIA) suspension 30 mL  30 mL Oral Daily PRN Oletta Darter, MD      . multivitamin with minerals tablet 1 tablet  1 tablet Oral Daily Oletta Darter, MD   1 tablet at 12/25/13 0825  . nabumetone (RELAFEN) tablet 750 mg  750 mg Oral BID Rachael Fee, MD   750 mg at 12/25/13 1314  . naltrexone (DEPADE) tablet 25 mg   25 mg Oral Daily Rachael Fee, MD   25 mg at 12/25/13 1315  . ondansetron (ZOFRAN-ODT) disintegrating tablet 4 mg  4 mg Oral Q6H PRN Oletta Darter, MD   4 mg at 12/23/13 0148  . thiamine (B-1) injection 100 mg  100 mg Intramuscular Once Oletta Darter, MD      . thiamine (VITAMIN B-1) tablet 100 mg  100 mg Oral Daily Oletta Darter, MD   100 mg at 12/25/13 0825  . traZODone (DESYREL) tablet 50 mg  50 mg Oral QHS PRN Kristeen Mans, NP   50 mg at 12/24/13 2149    Lab Results: No results found for this or any previous visit (from the past 48 hour(s)).  Physical Findings: AIMS:  , ,  ,  ,    CIWA:  CIWA-Ar Total: 0 COWS:     Treatment Plan Summary: Daily contact with patient to assess and evaluate symptoms and progress in treatment Medication management  Plan: Supportive approach/coping skills/relapse prevention           Increase the Buspar to 15 mg TID, the Neurontin to 300 mg TID           Resume Relafen for his back pain  Medical Decision Making Problem Points:  Review of psycho-social stressors (1) Data Points:  Review of medication regiment & side effects (2) Review of new medications or change in dosage (2)  I certify that inpatient services furnished can reasonably be expected to improve the patient's condition.   Silvia Hightower A 12/25/2013, 7:00 PM

## 2013-12-26 MED ORDER — HYDROXYZINE HCL 50 MG PO TABS
50.0000 mg | ORAL_TABLET | Freq: Four times a day (QID) | ORAL | Status: DC | PRN
  Administered 2013-12-26: 50 mg via ORAL
  Filled 2013-12-26: qty 1

## 2013-12-26 MED ORDER — HYDROXYZINE HCL 50 MG PO TABS
50.0000 mg | ORAL_TABLET | Freq: Four times a day (QID) | ORAL | Status: DC | PRN
  Administered 2013-12-26 – 2013-12-29 (×10): 50 mg via ORAL
  Filled 2013-12-26 (×2): qty 1
  Filled 2013-12-26: qty 2
  Filled 2013-12-26 (×4): qty 1
  Filled 2013-12-26 (×2): qty 2
  Filled 2013-12-26: qty 1

## 2013-12-26 MED ORDER — HYDROXYZINE HCL 50 MG PO TABS: 50.0000 mg | ORAL_TABLET | Freq: Four times a day (QID) | ORAL | Status: DC | PRN

## 2013-12-26 MED ORDER — GABAPENTIN 400 MG PO CAPS
400.0000 mg | ORAL_CAPSULE | Freq: Three times a day (TID) | ORAL | Status: DC
  Administered 2013-12-26 – 2013-12-27 (×4): 400 mg via ORAL
  Filled 2013-12-26 (×9): qty 1

## 2013-12-26 MED ORDER — LORAZEPAM 1 MG PO TABS
1.0000 mg | ORAL_TABLET | Freq: Two times a day (BID) | ORAL | Status: AC
  Administered 2013-12-26 – 2013-12-27 (×2): 1 mg via ORAL
  Filled 2013-12-26 (×2): qty 1

## 2013-12-26 MED ORDER — NALTREXONE HCL 50 MG PO TABS
50.0000 mg | ORAL_TABLET | Freq: Every day | ORAL | Status: DC
  Administered 2013-12-27 – 2013-12-29 (×3): 50 mg via ORAL
  Filled 2013-12-26 (×3): qty 14
  Filled 2013-12-26 (×3): qty 1

## 2013-12-26 NOTE — Progress Notes (Signed)
Pt attended NA group this evening.  

## 2013-12-26 NOTE — Clinical Social Work Note (Signed)
Pt expressed interested in getting scholarship for [facility]OP [facility]. CSW emailed Charmian MuffAnn Evans to get information about this and request that she visit pt directly.  The Sherwin-WilliamsHeather Smart, LCSWA 12/26/2013 2:22 PM

## 2013-12-26 NOTE — Progress Notes (Signed)
Patient ID: Brent Gay, male   DOB: 08/04/1965, 48 y.o.   MRN: [id number] He has been in bed  1st part of the shift and to part of one group this afternoon.  He has requested prn medication for anxiety only. Has scheduled medication for back pain. Self inventory: depression 7. Hopelessness 5, alcohol yes, W/D of tremors (none noted) craving irritable , chilling, agitation, cramping, and runny nose. Physical pain light headness,  Back and head pain. Goal of to see how his father is doing.

## 2013-12-26 NOTE — Progress Notes (Signed)
[facility]  12/26/2013 2:41 PM Milus HeightJohn F Tipler  MRN:  [id number] Subjective:  Still endorsing anxiety. States his father was given around 2 weeks to live. He states he is every close to his father and he does not know how he is going to react once he dies. He has coped with stressful things by drinking, using so he wants to be sober and find other ways of coping.  Diagnosis:   DSM5: Schizophrenia Disorders:  none Obsessive-Compulsive Disorders:  none Trauma-Stressor Disorders:  none Substance/Addictive Disorders:  Alcohol Related Disorder - Severe (303.90) Cocaine use disorder Depressive Disorders:  Major Depressive Disorder - Mild (296.21) Total Time spent with patient: 30 minutes  Axis I: Generalized Anxiety Disorder  ADL's:  Intact  Sleep: Fair  Appetite:  Fair  Suicidal Ideation:  Plan:  denies Intent:  denies Means:  denies Homicidal Ideation:  Plan:  denies Intent:  denies Means:  denies AEB (as evidenced by):  Psychiatric Specialty Exam: Physical Exam  Review of Systems  Constitutional: Positive for malaise/fatigue.  HENT: Negative.   Eyes: Negative.   Respiratory: Negative.   Cardiovascular: Negative.   Gastrointestinal: Negative.   Genitourinary: Negative.   Musculoskeletal: Negative.   Skin: Negative.   Neurological: Negative.   Endo/Heme/Allergies: Negative.   Psychiatric/Behavioral: Positive for substance abuse. The patient is nervous/anxious.     Blood pressure 114/78, pulse 110, temperature 97.6 F (36.4 C), temperature source Oral, resp. rate 18, height 5' 6.5" (1.689 m), weight 92.08 kg (203 lb).Body mass index is 32.28 kg/(m^2).  General Appearance: Fairly Groomed  Patent attorneyye Contact::  Fair  Speech:  Clear and Coherent  Volume:  Decreased  Mood:  Anxious and sad, worried  Affect:  Restricted  Thought Process:  Coherent and Goal Directed  Orientation:  Full (Time, Place, and Person)  Thought Content:  symtpoms worries concerns grief  anticipating father's death  Suicidal Thoughts:  No  Homicidal Thoughts:  No  Memory:  Immediate;   Fair Recent;   Fair Remote;   Fair  Judgement:  Fair  Insight:  Present and Shallow  Psychomotor Activity:  Restlessness  Concentration:  Fair  Recall:  FiservFair  Fund of Knowledge:NA  Language: Fair  Akathisia:  No  Handed:    AIMS (if indicated):     Assets:  Desire for Improvement  Sleep:  Number of Hours: 6.25   Musculoskeletal: Strength & Muscle Tone: within normal limits Gait & Station: normal Patient leans: N/A  Current Medications: Current Facility-Administered Medications  Medication Dose Route Frequency Provider Last Rate Last Dose  . acetaminophen (TYLENOL) tablet 650 mg  650 mg Oral Q6H PRN Oletta DarterSalina Agarwal, MD   650 mg at 12/23/13 2257  . alum & mag hydroxide-simeth (MAALOX/MYLANTA) 200-200-20 MG/5ML suspension 30 mL  30 mL Oral Q4H PRN Oletta DarterSalina Agarwal, MD      . busPIRone (BUSPAR) tablet 15 mg  15 mg Oral TID Rachael FeeIrving A Laval Cafaro, MD   15 mg at 12/26/13 1213  . gabapentin (NEURONTIN) capsule 400 mg  400 mg Oral TID Rachael FeeIrving A Virgie Kunda, MD   400 mg at 12/26/13 1213  . hydrOXYzine (ATARAX/VISTARIL) tablet 50 mg  50 mg Oral Q6H PRN Rachael FeeIrving A Davinci Glotfelty, MD   50 mg at 12/26/13 1112  . LORazepam (ATIVAN) tablet 1 mg  1 mg Oral BID Rachael FeeIrving A Kallen Mccrystal, MD      . magnesium hydroxide (MILK OF MAGNESIA) suspension 30 mL  30 mL Oral Daily PRN Oletta DarterSalina Agarwal, MD      .  multivitamin with minerals tablet 1 tablet  1 tablet Oral Daily Oletta Darter, MD   1 tablet at 12/26/13 0855  . nabumetone (RELAFEN) tablet 750 mg  750 mg Oral BID Rachael Fee, MD   750 mg at 12/26/13 0854  . [START ON 12/27/2013] naltrexone (DEPADE) tablet 50 mg  50 mg Oral Daily Rachael Fee, MD      . thiamine (B-1) injection 100 mg  100 mg Intramuscular Once Oletta Darter, MD      . thiamine (VITAMIN B-1) tablet 100 mg  100 mg Oral Daily Oletta Darter, MD   100 mg at 12/26/13 0854  . traZODone (DESYREL) tablet 50 mg  50 mg Oral QHS  PRN Kristeen Mans, NP   50 mg at 12/25/13 2219    Lab Results: No results found for this or any previous visit (from the past 48 hour(s)).  Physical Findings: AIMS:  , ,  ,  ,    CIWA:  CIWA-Ar Total: 0 COWS:     Treatment Plan Summary: Daily contact with patient to assess and evaluate symptoms and progress in treatment Medication management  Plan: Supportive approach/coping skills/relapse prevention           Resume Vistaril           Increase the Neurontin to 400 mg TID           Wean off the Ativan            CBT/mindfulness/grief loss Medical Decision Making Problem Points:  Review of psycho-social stressors (1) Data Points:  Review of medication regiment & side effects (2) Review of new medications or change in dosage (2)  I certify that inpatient services furnished can reasonably be expected to improve the patient's condition.   Samaj Wessells A 12/26/2013, 2:41 PM

## 2013-12-26 NOTE — [facility] Group Notes (Signed)
[facility]   12/26/2013 10:07 AM  Participation Quality:  Minimal   Mood/Affect:  Depressed and Irritable  Depression Rating:  7  Anxiety Rating:  9  Thoughts of Suicide:  No Will you contract for safety?   NA  Current AVH:  No  Plan for Discharge/Comments:  Pt reports, "My meds are all screwed up. I'm not ready to leave." Pt reports high depression and anxiety with poor sleep and mild withdrawals today. Pt stated that he notices small improvements but does not feel mentally stable for d/c at this time. [facility] o/p for services/pt [place].  Transportation Means: bus pass (in chart)  Supports: some family supports identified   Counselling psychologistmart, OncologistHeather LCSWA

## 2013-12-26 NOTE — Progress Notes (Signed)
Patient ID: Brent Gay HeightJohn F Lapre, male   DOB: 08/29/1965, 48 y.o.   MRN: [id number]  D: Pt informed the writer that he may be scheduled for discharge Friday. Stated he plans to do IOP on Mon, Tues, Wed from 1300 to 1600.  A:  Support and encouragement was offered. 15 min checks continued for safety.  R: Pt remains safe.

## 2013-12-26 NOTE — B[facility] Group Notes (Signed)
[facility] LCSW Group Therapy  12/26/2013 3:18 PM  Type of Therapy:  Group Therapy  Participation Level:  Active  Participation Quality:  Attentive  Affect:  Appropriate  Cognitive:  Alert and Oriented  Insight:  Engaged  Engagement in Therapy:  Improving  Modes of Intervention:  Confrontation, Discussion, Education, Exploration, Problem-solving, Rapport Building, Socialization and Support  Summary of Progress/Problems: Emotion Regulation: This group focused on both positive and negative emotion identification and allowed group members to process ways to identify feelings, regulate negative emotions, and find healthy ways to manage internal/external emotions. Group members were asked to reflect on a time when their reaction to an emotion led to a negative outcome and explored how alternative responses using emotion regulation would have benefited them. Group members were also asked to discuss a time when emotion regulation was utilized when a negative emotion was experienced. [name] was attentive and engaged throughout today's therapy group. He shared that he struggles with grief/depression surrounding his father's terminal illness, mental and physical deterioration, and impending death. [name] was able to identify how grief/depression differed, and how they feel to him both mentally and physically. [name] shows progress in the group setting and improving insight AEB his ability to process how "going to CDIOP, grief counseling, taking medication as prescribed, avoiding drinking, and communication with my positive family supports" can help him regulate grief/depression and avoid relapse.    Smart, Bocephus Cali LCSWA  12/26/2013, 3:18 PM

## 2013-12-27 MED ORDER — GABAPENTIN 300 MG PO CAPS
600.0000 mg | ORAL_CAPSULE | Freq: Three times a day (TID) | ORAL | Status: DC
  Administered 2013-12-27 – 2013-12-29 (×6): 600 mg via ORAL
  Filled 2013-12-27: qty 2
  Filled 2013-12-27 (×3): qty 42
  Filled 2013-12-27 (×3): qty 2
  Filled 2013-12-27: qty 42
  Filled 2013-12-27: qty 2
  Filled 2013-12-27 (×4): qty 42
  Filled 2013-12-27 (×2): qty 2
  Filled 2013-12-27: qty 42

## 2013-12-27 NOTE — Progress Notes (Signed)
Patient ID: Brent Gay HeightJohn F Trachtenberg, male   DOB: 07/24/1965, 48 y.o.   MRN: [id number]  D: When asked about his day, pt stated, "I think I may need to stay another day to get a place secured like [facility]. Everybody is out of town. Not sure I can get out tomorrow and have a secure place to go", referring to detox.  A: Writer informed pt that info would be forwarded to his doctor.  Support and encouragement was offered. 15 min checks continued for safety.  R: Pt remains safe.

## 2013-12-27 NOTE — BHH Group Notes (Signed)
0900 nursing orientation group  The focus of this group is to educate the patient on the purpose and policies of crisis stabilization and provide a format to answer questions about their admission.  The group details unit policies and expectations of patients while admitted.  He was an active participant and was appropriate.  He shared with the group that "whenever I catch a fish rather big or small it makes me happy"

## 2013-12-27 NOTE — Progress Notes (Signed)
Patient ID: [name], male   DOB: 06/12/1966, 48 y.o.   MRN: [id number] He has been up and about and to group. Has been more active today . Requested and received prn vistaril that was helpful. Self inventory: depression 9, hopelessness 5, anxiety 9, withdrawal symptoms of tremors craving, agitation,runny nose chilling and irritability. Has only asked for prn for anxiety. Other symptoms were not mentioned. Pain indicated was back pain joint and feet pain . There was no request for prn medication for pain.

## 2013-12-27 NOTE — Progress Notes (Signed)
[facility]  12/27/2013 4:18 PM Brent Brent Gay  MRN:  [id number] Subjective:  Still dealing with the anxiety the worry. Still anticipating the death of his father. He is wanting to get stronger so he can deal with it without relapsing. Admits that at times he gets easily irritated.  Diagnosis:   DSM5: Schizophrenia Disorders:  none Obsessive-Compulsive Disorders:  none Trauma-Stressor Disorders:  none Substance/Addictive Disorders:  Alcohol Related Disorder - Severe (303.90), Cocaine Use disorder Depressive Disorders:  Major Depressive Disorder - Moderate (296.22) Total Time spent with patient: 30 minutes  Axis I: Generalized Anxiety Disorder  ADL's:  Intact  Sleep: Fair  Appetite:  Fair  Suicidal Ideation:  Plan:  denies Intent:  denies Means:  denies Homicidal Ideation:  Plan:  denies Intent:  denies Means:  denies AEB (as evidenced by):  Psychiatric Specialty Exam: Physical Exam  Review of Systems  Constitutional: Negative.   HENT: Negative.   Eyes: Negative.   Respiratory: Negative.   Cardiovascular: Negative.   Gastrointestinal: Negative.   Genitourinary: Negative.   Musculoskeletal: Negative.   Skin: Negative.   Endo/Heme/Allergies: Negative.   Psychiatric/Behavioral: Positive for depression and substance abuse. The patient is nervous/anxious.     Blood pressure 119/81, pulse 98, temperature 97.8 F (36.6 C), temperature source Oral, resp. rate 20, height 5' 6.5" (1.689 m), weight 92.08 kg (203 lb).Body mass index is 32.28 kg/(m^2).  General Appearance: Fairly Groomed  Patent attorneyye Contact::  Fair  Speech:  Clear and Coherent  Volume:  fluctuates  Mood:  Anxious and Irritable  Affect:  Appropriate  Thought Process:  Coherent and Goal Directed  Orientation:  Full (Time, Place, and Person)  Thought Content:  symtpoms worries concerns  Suicidal Thoughts:  No  Homicidal Thoughts:  No  Memory:  Immediate;   Fair Recent;   Fair Remote;   Fair  Judgement:   Fair  Insight:  Present  Psychomotor Activity:  Restlessness  Concentration:  Fair  Recall:  FiservFair  Fund of Knowledge:NA  Language: Fair  Akathisia:  No  Handed:    AIMS (if indicated):     Assets:  Desire for Improvement Housing Transportation  Sleep:  Number of Hours: 5.75   Musculoskeletal: Strength & Muscle Tone: within normal limits Gait & Station: normal Patient leans: N/A  Current Medications: Current Facility-Administered Medications  Medication Dose Route Frequency Provider Last Rate Last Dose  . acetaminophen (TYLENOL) tablet 650 mg  650 mg Oral Q6H PRN Oletta DarterSalina Agarwal, MD   650 mg at 12/23/13 2257  . alum & mag hydroxide-simeth (MAALOX/MYLANTA) 200-200-20 MG/5ML suspension 30 mL  30 mL Oral Q4H PRN Oletta DarterSalina Agarwal, MD      . busPIRone (BUSPAR) tablet 15 mg  15 mg Oral TID Rachael FeeIrving A Flay Ghosh, MD   15 mg at 12/27/13 1201  . gabapentin (NEURONTIN) capsule 600 mg  600 mg Oral TID Rachael FeeIrving A Lajada Janes, MD      . hydrOXYzine (ATARAX/VISTARIL) tablet 50 mg  50 mg Oral Q6H PRN Rachael FeeIrving A Silas Muff, MD   50 mg at 12/27/13 1008  . magnesium hydroxide (MILK OF MAGNESIA) suspension 30 mL  30 mL Oral Daily PRN Oletta DarterSalina Agarwal, MD      . multivitamin with minerals tablet 1 tablet  1 tablet Oral Daily Oletta DarterSalina Agarwal, MD   1 tablet at 12/27/13 0837  . nabumetone (RELAFEN) tablet 750 mg  750 mg Oral BID Rachael FeeIrving A Wasif Simonich, MD   750 mg at 12/27/13 0837  . naltrexone (DEPADE) tablet  50 mg  50 mg Oral Daily Rachael Fee, MD   50 mg at 12/27/13 1610  . thiamine (B-1) injection 100 mg  100 mg Intramuscular Once Oletta Darter, MD      . thiamine (VITAMIN B-1) tablet 100 mg  100 mg Oral Daily Oletta Darter, MD   100 mg at 12/27/13 0836  . traZODone (DESYREL) tablet 50 mg  50 mg Oral QHS PRN Kristeen Mans, NP   50 mg at 12/26/13 2242    Lab Results: No results found for this or any previous visit (from the past 48 hour(s)).  Physical Findings: AIMS: Facial and Oral Movements Muscles of Facial Expression: None,  normal Lips and Perioral Area: None, normal Jaw: None, normal Tongue: None, normal,Extremity Movements Upper (arms, wrists, hands, fingers): None, normal Lower (legs, knees, ankles, toes): None, normal, Trunk Movements Neck, shoulders, hips: None, normal, Overall Severity Severity of abnormal movements (highest score from questions above): None, normal Incapacitation due to abnormal movements: None, normal Patient's awareness of abnormal movements (rate only patient's report): No Awareness,    CIWA:  CIWA-Ar Total: 3 COWS:     Treatment Plan Summary: Daily contact with patient to assess and evaluate symptoms and progress in treatment Medication management  Plan: Supportive approach/coping skills/relapse prevention           Increase the Neurontin to 600 mg TID (previous effective dose)           Reassess and address the co morbities  Medical Decision Making Problem Points:  Review of psycho-social stressors (1) Data Points:  Review of medication regiment & side effects (2) Review of new medications or change in dosage (2)  I certify that inpatient services furnished can reasonably be expected to improve the patient's condition.   Bridget Westbrooks A 12/27/2013, 4:18 PM

## 2013-12-27 NOTE — Progress Notes (Signed)
Patient ID: Brent Gay, male   DOB: 09/02/1965, 48 y.o.   MRN: [id number] He has been discharged and is [facility]. He voiced understanding of discharge instruction and of the follow up plan.  He denies thoughts of SI and all his belongs were taken with him.

## 2013-12-27 NOTE — B[facility] Group Notes (Signed)
[facility] LCSW Group Therapy  12/27/2013 2:38 PM  Type of Therapy:  Group Therapy  Participation Level:  Active  Participation Quality:  Attentive  Affect:  Appropriate  Cognitive:  Alert and Oriented  Insight:  Improving  Engagement in Therapy:  Improving  Modes of Intervention:  Confrontation, Discussion, Education, Exploration, Problem-solving, Rapport Building, Socialization and Support  Summary of Progress/Problems:  Finding Balance in Life. Today's group focused on defining balance in one's own words, identifying things that can knock one off balance, and exploring healthy ways to maintain balance in life. Group members were asked to provide an example of a time when they felt off balance, describe how they handled that situation,and process healthier ways to regain balance in the future. Group members were asked to share the most important tool for maintaining balance that they learned [facility] and how they plan to apply this method after discharge. Brent Gay was attentive and engaged throughout today's therapy session. He shared that "dealing with his father's terminal illness, family turmoil, homelessness, and alcohol addiction" has caused a sense of life imbalance. Brent RuizJohn shows progress in the group setting and improving insight AEB his ability to process how "putting sobriety first" will help him in other facets of his life. "I want to get into the CDIOP program downstairs because I will need all the support I can get dealing with my father and when he dies."    Smart, [name] LCSWA  12/27/2013, 2:38 PM

## 2013-12-28 MED ORDER — HYDROXYZINE HCL 50 MG PO TABS: ORAL_TABLET | ORAL | Status: DC

## 2013-12-28 MED ORDER — BUSPIRONE HCL 15 MG PO TABS: 15.0000 mg | ORAL_TABLET | Freq: Three times a day (TID) | ORAL | Status: DC

## 2013-12-28 MED ORDER — HYDROXYZINE HCL 50 MG PO TABS
50.0000 mg | ORAL_TABLET | Freq: Three times a day (TID) | ORAL | Status: DC | PRN
  Filled 2013-12-28: qty 42

## 2013-12-28 MED ORDER — TRAZODONE HCL 50 MG PO TABS
50.0000 mg | ORAL_TABLET | Freq: Every evening | ORAL | Status: DC | PRN
Start: 2013-12-28 — End: 2014-03-05

## 2013-12-28 MED ORDER — GABAPENTIN 300 MG PO CAPS: 600.0000 mg | ORAL_CAPSULE | Freq: Three times a day (TID) | ORAL | Status: DC

## 2013-12-28 MED ORDER — NABUMETONE 500 MG PO TABS: 500.0000 mg | ORAL_TABLET | Freq: Every day | ORAL | Status: DC

## 2013-12-28 MED ORDER — NALTREXONE HCL 50 MG PO TABS: 50.0000 mg | ORAL_TABLET | Freq: Every day | ORAL | Status: DC

## 2013-12-28 MED ORDER — TRAZODONE HCL 50 MG PO TABS: 50.0000 mg | ORAL_TABLET | Freq: Every evening | ORAL | Status: DC | PRN

## 2013-12-28 MED ORDER — IBUPROFEN 800 MG PO TABS: 800.0000 mg | ORAL_TABLET | Freq: Four times a day (QID) | ORAL | Status: DC | PRN

## 2013-12-28 MED ORDER — NABUMETONE 750 MG PO TABS: 750.0000 mg | ORAL_TABLET | Freq: Two times a day (BID) | ORAL | Status: DC

## 2013-12-28 NOTE — [facility] Suicide Risk Assessment (Signed)
Suicide Risk Assessment  Discharge Assessment     Demographic Factors:  Male and Caucasian  Total Time spent with patient: 45 minutes  Psychiatric Specialty Exam:     Blood pressure 108/62, pulse 101, temperature 97.9 F (36.6 C), temperature source Oral, resp. rate 16, height 5' 6.5" (1.689 m), weight 92.08 kg (203 lb).Body mass index is 32.28 kg/(m^2).  General Appearance: Fairly Groomed  Patent attorneyye Contact::  Fair  Speech:  Clear and Coherent  Volume:  Normal  Mood:  worried  Affect:  Appropriate  Thought Process:  Coherent and Goal Directed  Orientation:  Full (Time, Place, and Person)  Thought Content:  plans as he moves on, relapse prevention plan  Suicidal Thoughts:  No  Homicidal Thoughts:  No  Memory:  Immediate;   Fair Recent;   Fair Remote;   Fair  Judgement:  Fair  Insight:  Present  Psychomotor Activity:  Normal  Concentration:  Fair  Recall:  FiservFair  Fund of Knowledge:NA  Language: Fair  Akathisia:  No  Handed:    AIMS (if indicated):     Assets:  Desire for Improvement  Sleep:  Number of Hours: 5    Musculoskeletal: Strength & Muscle Tone: within normal limits Gait & Station: normal Patient leans: N/A   Mental Status Per Nursing Assessment::   On Admission:  NA  Current Mental Status by Physician: In full contact with reality. There are no active S/S of withdrawal. No active SI plans or intent. Aware that he will have to deal with the death of his father and that he wants  to deal with it without using drugs or alcohol   Loss Factors: anticipation of the death of his father (Hospice-terminal cancer)  Historical Factors: NA  Risk Reduction Factors:   Sense of responsibility to family  Continued Clinical Symptoms:  Depression:   Comorbid alcohol abuse/dependence Alcohol/Substance Abuse/Dependencies  Cognitive Features That Contribute To Risk:  Closed-mindedness Polarized thinking Thought constriction (tunnel vision)    Suicide Risk:   Minimal: No identifiable suicidal ideation.  Patients presenting with no risk factors but with morbid ruminations; may be classified as minimal risk based on the severity of the depressive symptoms  Discharge Diagnoses:   AXIS I:  Alcohol Dependence, S/P alcohol withdrawal, Cocaine abuse, GAD, Substance Induced Mood Disorder AXIS II:  No diagnosis AXIS III:   Past Medical History  Diagnosis Date  . Hypertension   . Chronic back pain   . Alcohol abuse   . GERD (gastroesophageal reflux disease)   . Gastritis   . Atypical chest pain   . Chronic pain   . Alcoholic liver disease    AXIS IV:  other psychosocial or environmental problems AXIS V:  61-70 mild symptoms  Plan Of Care/Follow-up recommendations:  Activity:  as tolerated Diet:  regular Follow up outpatient basis/AA Is patient on multiple antipsychotic therapies at discharge:  No   Has Patient had three or more failed trials of antipsychotic monotherapy by history:  No  Recommended Plan for Multiple Antipsychotic Therapies: NA    Yanilen Adamik A 12/28/2013, 5:24 PM

## 2013-12-28 NOTE — [facility] Group Notes (Signed)
[facility] LCSW Group Therapy  12/28/2013  1:15 PM   Type of Therapy:  Group Therapy  Participation Level:  Active  Participation Quality:  Attentive, Sharing and Supportive  Affect:  Depressed and Flat  Cognitive:  Alert and Oriented  Insight:  Developing/Improving and Engaged  Engagement in Therapy:  Developing/Improving and Engaged  Modes of Intervention:  Clarification, Confrontation, Discussion, Education, Exploration, Limit-setting, Orientation, Problem-solving, Rapport Building, Dance movement psychotherapisteality Testing, Socialization and Support  Summary of Progress/Problems: The topic for today was feelings about relapse.  Pt discussed what relapse prevention is to them and identified triggers that they are on the path to relapse.  Pt processed their feeling towards relapse and was able to relate to peers.  Pt discussed coping skills that can be used for relapse prevention.  Pt states that he has relapsed off and on for some time but finding out his dad is passing away was a trigger for him.  Pt discussed alcohol being advertised everywhere as a trigger for him.  Pt was in and out of group, interviewing for an [facility] so minimally engaged in the group discussion.    Brent IvanChelsea Horton, LCSW 12/28/2013  2:09 PM

## 2013-12-28 NOTE — Discharge Summary (Signed)
Physician Discharge Summary Note  Patient:  Brent Gay is an 48 y.o., male MRN:  [id number] DOB:  06/26/1965 Patient phone:  [phone] (home)  Patient address:   [address] [place] [address],  Total Time spent with patient: Greater than 30 minutes  Date of Admission:  12/23/2013  Date of Discharge: 12/28/13  Reason for Admission: alcohol dependence  Discharge Diagnoses: Active Problems:   Alcohol dependence   GAD (generalized anxiety disorder)   Psychiatric Specialty Exam: Physical Exam  Psychiatric: His speech is normal and behavior is normal. Judgment and thought content normal. His mood appears not anxious. His affect is not angry, not blunt, not labile and not inappropriate. Cognition and memory are normal. He does not exhibit a depressed mood.    Review of Systems  Constitutional: Negative.   HENT: Negative.   Eyes: Negative.   Respiratory: Negative.   Cardiovascular: Negative.   Gastrointestinal: Negative.   Genitourinary: Negative.   Musculoskeletal: Negative.   Skin: Negative.   Neurological: Negative.   Endo/Heme/Allergies: Negative.   Psychiatric/Behavioral: Positive for substance abuse (Alcohol/cocaine dependence). Negative for depression, suicidal ideas, hallucinations and memory loss. The patient has insomnia (Stable ). The patient is not nervous/anxious.     Blood pressure 108/62, pulse 101, temperature 97.9 F (36.6 C), temperature source Oral, resp. rate 16, height 5' 6.5" (1.689 m), weight 92.08 kg (203 lb).Body mass index is 32.28 kg/(m^2).  General Appearance: Fairly Groomed  Patent attorney:: Fair  Speech: Clear and Coherent  Volume: Normal  Mood: worried  Affect: Appropriate  Thought Process: Coherent and Goal Directed  Orientation: Full (Time, Place, and Person)  Thought Content: plans as he moves on, relapse prevention plan  Suicidal Thoughts: No  Homicidal Thoughts: No Memory: Immediate; Fair  Recent; Fair  Remote; Fair   Judgement: Fair  Insight: Present  Psychomotor Activity: Normal  Concentration: Fair  Recall: [employer] of Knowledge:NA  Language: Fair  Akathisia: No  Handed:  AIMS (if indicated):  Assets: Desire for Improvement  Sleep: Number of Hours: 5   Past Psychiatric History: Diagnosis: Alcohol dependence, generalized anxiety disorder  Hospitalizations: [facility] adult unit  Outpatient Care: [facility]  Substance Abuse Care: [facility]   Self-Mutilation: NA  Suicidal Attempts: NA  Violent Behaviors: NA   Musculoskeletal: Strength & Muscle Tone: within normal limits Gait & Station: normal Patient leans: N/A  DSM5: Schizophrenia Disorders:  NA Obsessive-Compulsive Disorders:  NA Trauma-Stressor Disorders:  NA Substance/Addictive Disorders:  Alcohol dependence, Cocaine dependence Depressive Disorders:  Generalized anxiety  Axis Diagnosis:  AXIS I:  Alcohol dependence, generalized anxiety disorder AXIS II:  Deferred AXIS III:   Past Medical History  Diagnosis Date  . Hypertension   . Chronic back pain   . Alcohol abuse   . GERD (gastroesophageal reflux disease)   . Gastritis   . Atypical chest pain   . Chronic pain   . Alcoholic liver disease    AXIS IV:  other psychosocial or environmental problems and Alcoholism, chronic AXIS V:  62  Level of Care:  OP  Hospital Course:  Male, 48 years old was admitted yesterday evening for Alcohol Dependence. This is one of several detox treatment for patient who was discharged from our inpatient Chemical dependency unit after detox from alcohol on June 29 th. Patient states he relapsed Same day he left. Patient drank last on Friday and he drinks a bottle of Vodka a day.   Deiontae was admitted to the hospital for  alcohol/drug detoxification treatments. His detox treatment was achieved using Ativan detox regimen on a tapering dose format. He also was enrolled and participated in the group counseling sessions and AA/NA meetings being offered  and held on this unit. He learned coping skills. Besides the detox treatment, Jonny RuizJohn was medicated and discharged on Buspar 15 mg three times daily for anxiety, Neurontin 600 mg three times daily for substance withdrawal syndrome, Hydroxyzine 50 mg three times daily for anxiety, Naltraxone 50 mg daily for alcoholism and Trazodone 50 mg Q bedtime for sleep.   Jamone also received other medication management for his other medical issues that he presented. He tolerated his treatment regimen without any adverse effects and or reactions. Elise has completed detox treatment and his mood is stable. He is currently being discharged to continue substance abuse treatment and routine psychiatric care [facility] an [facility] basis [facility]. He has been provided with all the pertinent information needed to make these appointments without problems.  Upon discharge, he adamantly denies any SIHI, AVH, delusional thoughts, paranoia and or withdrawal symptoms. [facility] 14 days worth, supply samples of his discharge medications. [facility] with all belongings in no apparent distress. Transportation per city bus. [facility] provided bus pass.   Consults:  psychiatry  Significant Diagnostic Studies:  labs: CBC with diff, CMP, UDS, toxicology tests, U/A  Discharge Vitals:   Blood pressure 108/62, pulse 101, temperature 97.9 F (36.6 C), temperature source Oral, resp. rate 16, height 5' 6.5" (1.689 m), weight 92.08 kg (203 lb). Body mass index is 32.28 kg/(m^2). Lab Results:   No results found for this or any previous visit (from the past 72 hour(s)).  Physical Findings: AIMS: Facial and Oral Movements Muscles of Facial Expression: None, normal Lips and Perioral Area: None, normal Jaw: None, normal Tongue: None, normal,Extremity Movements Upper (arms, wrists, hands, fingers): None, normal Lower (legs, knees, ankles, toes): None, normal, Trunk Movements Neck,  shoulders, hips: None, normal, Overall Severity Severity of abnormal movements (highest score from questions above): None, normal Incapacitation due to abnormal movements: None, normal Patient's awareness of abnormal movements (rate only patient's report): No Awareness,    CIWA:  CIWA-Ar Total: 1 COWS:     Psychiatric Specialty Exam: See Psychiatric Specialty Exam and Suicide Risk Assessment completed by Attending Physician prior to discharge.  Discharge destination:  Home  Is patient on multiple antipsychotic therapies at discharge:  No   Has Patient had three or more failed trials of antipsychotic monotherapy by history:  No  Recommended Plan for Multiple Antipsychotic Therapies: NA    Medication List    STOP taking these medications       ibuprofen 200 MG tablet  Commonly known as:  ADVIL,MOTRIN      TAKE these medications     Indication   busPIRone 15 MG tablet  Commonly known as:  BUSPAR  Take 1 tablet (15 mg total) by mouth 3 (three) times daily. For anxiety   Indication:  Anxiety Disorder, Symptoms of Feeling Anxious     gabapentin 300 MG capsule  Commonly known as:  NEURONTIN  Take 2 capsules (600 mg total) by mouth 3 (three) times daily. For substance withdrawal syndrome   Indication:  Substance withdrawal syndrome     hydrOXYzine 50 MG tablet  Commonly known as:  ATARAX/VISTARIL  Take 1 tablet (50 mg) three times daily as needed: for Anxiety/tension   Indication:  Tension, Anxiety  nabumetone 750 MG tablet  Commonly known as:  RELAFEN  Take 1 tablet (750 mg total) by mouth 2 (two) times daily. For pain   Indication:  Back pain     naltrexone 50 MG tablet  Commonly known as:  DEPADE  Take 1 tablet (50 mg total) by mouth daily. For alcohol addiction   Indication:  Excessive Use of Alcohol     traZODone 50 MG tablet  Commonly known as:  DESYREL  Take 1 tablet (50 mg total) by mouth at bedtime as needed for sleep.   Indication:  Trouble Sleeping        Follow-up Information   Follow up with [facility]. (Walk in between 8am-9am Monday through Friday for hospital follow-up/medication management/assessment for therapy services. )    Contact information:   [address], [place] [address] Phone: [phone] Fax: [phone]      Follow up with [facility]  On 12/31/2013. Dewayne Hatch from [facility] will call you on Monday to give you scholarship update and schedule orientation. )    Contact information:   [address] [place], [place] [address] Phone: [phone] Fax: [phone]     Follow-up recommendations:  Activity:  As tolerated Diet: As recommended by your primary care doctor. Keep all scheduled follow-up appointments as recommended.  Comments:  Take all your medications as prescribed by your mental healthcare provider. Report any adverse effects and or reactions from your medicines to your outpatient provider promptly. Patient is instructed and cautioned to not engage in alcohol and or illegal drug use while on prescription medicines. In the event of worsening symptoms, patient is instructed to call the crisis hotline, 911 and or go to the nearest ED for appropriate evaluation and treatment of symptoms. Follow-up with your primary care provider for your other medical issues, concerns and or health care needs.   Total Discharge Time:  Greater than 30 minutes.  Signed: Sanjuana Kava, PMHNP-BC 12/28/2013, 11:53 AM I personally assessed the patient and formulated the plan Madie Reno A. Dub Mikes, M.D.

## 2013-12-28 NOTE — Progress Notes (Addendum)
[facility] Discharge Plan :  Will you be returning to the same living situation after discharge: No. shelter/[facility]/friend's house until stable housing secured.  At discharge, do you have transportation home?:Yes,  bus pass in chart.  Do you have the ability to pay for your medications:Yes,  mental health  Release of information consent forms completed and submitted to Medical Records by CSW. Patient to Follow up at: Follow-up Information   Follow up with [facility]. (Walk in between 8am-9am Monday through Friday for hospital follow-up/medication management/assessment for therapy services. )    Contact information:   [address] [place], [place] [address] Phone: [phone] Fax: [phone]      Follow up with [facility]  On 12/31/2013. Dewayne Hatch(Ann from [facility] will call you on Monday to give you scholarship update and schedule orientation. )    Contact information:   [address] [place], [place] [address] Phone: [phone] Fax: [phone]      Patient denies SI/HI:   Yes,  during group, admission, and self report.     Safety Planning and Suicide Prevention discussed:  Yes,  SPE not required as pt did not endorse SI during stay or during admission. SPI pamphlet provided to pt and he was encouraged to share information with support network, ask questions, and talk about any concerns relating to SPE.  Smart, Garwood Wentzell LCSWA  12/28/2013, 10:12 AM  Pt discharge moved to [facility], 12/29/13 per Dr. Dub MikesLugo. Bus pass in chart.  The Sherwin-WilliamsHeather Smart, LCSWA 12/28/2013 2:23 PM

## 2013-12-28 NOTE — Progress Notes (Signed)
Pt stated he is still unsure where he will go tomorrow. He told the nurse his dad is dying of cancer. Pt does contract for safety and denies Si and HI. He is suppose to have a phone interview tonight at 7:30pm [facility].

## 2013-12-28 NOTE — Progress Notes (Signed)
Pt attended the evening AA speaker meeting. 

## 2013-12-28 NOTE — [facility] Group Notes (Signed)
[facility] Discharge Planning Group Note   12/28/2013 9:40 AM  Participation Quality:  Appropriate   Mood/Affect:  Appropriate  Depression Rating:  3  Anxiety Rating:  8  Thoughts of Suicide:  No Will you contract for safety?   NA  Current AVH:  No  Plan for Discharge/Comments:  Pt reports that he spoke with his father and it made him feel good last night. Pt reports poor sleep with no withdrawals. Per [place], pt must d/c today. Ann at [facility] working on scholarship for program and will call pt on Monday. [facility] if [facility] does not work out. [place] list provided to pt per his request.   Transportation Means: bus pass in chart   Supports: father/some family supports.   Smart, American FinancialHeather LCSWA

## 2013-12-28 NOTE — Progress Notes (Signed)
[facility]  12/28/2013 3:46 PM Milus HeightJohn F Furgerson  MRN:  [id number] Subjective:  Still not doing well. States that he is still very anxious, worried. Would like to be feeling a little better before he goes as states he is going to be dealing with the death of his father. Concerned about relapse if he was to feeling like this when he finally dies. He would like to be stronger Diagnosis:   DSM5: Schizophrenia Disorders:  none Obsessive-Compulsive Disorders:  none Trauma-Stressor Disorders:  none Substance/Addictive Disorders:  Alcohol Related Disorder - Severe (303.90) Cocaine related disorder Depressive Disorders:  Major Depressive Disorder - Moderate (296.22) Total Time spent with patient: 30 minutes  Axis I: Generalized Anxiety Disorder  ADL's:  Intact  Sleep: Poor  Appetite:  Fair  Suicidal Ideation:  Plan:  denies Intent:  denies Means:  denies Homicidal Ideation:  Plan:  denies Intent:  denies Means:  denies AEB (as evidenced by):  Psychiatric Specialty Exam: Physical Exam  Review of Systems  Constitutional: Negative.   Eyes: Negative.   Respiratory: Negative.   Cardiovascular: Negative.   Gastrointestinal: Negative.   Genitourinary: Negative.   Musculoskeletal: Negative.   Skin: Negative.   Neurological: Positive for headaches.  Endo/Heme/Allergies: Negative.   Psychiatric/Behavioral: Positive for depression and substance abuse. The patient is nervous/anxious and has insomnia.     Blood pressure 108/62, pulse 101, temperature 97.9 F (36.6 C), temperature source Oral, resp. rate 16, height 5' 6.5" (1.689 m), weight 92.08 kg (203 lb).Body mass index is 32.28 kg/(m^2).  General Appearance: Fairly Groomed  Patent attorneyye Contact::  Fair  Speech:  Clear and Coherent  Volume:  Decreased  Mood:  Anxious and worried  Affect:  anxious  Thought Process:  Coherent and Goal Directed  Orientation:  Full (Time, Place, and Person)  Thought Content:  symptoms worries concerns   Suicidal Thoughts:  No  Homicidal Thoughts:  No  Memory:  Immediate;   Fair Recent;   Fair Remote;   Fair  Judgement:  Fair  Insight:  Present  Psychomotor Activity:  Restlessness  Concentration:  Fair  Recall:  FiservFair  Fund of Knowledge:NA  Language: Fair  Akathisia:  No  Handed:    AIMS (if indicated):     Assets:  Desire for Improvement  Sleep:  Number of Hours: 5   Musculoskeletal: Strength & Muscle Tone: within normal limits Gait & Station: normal Patient leans: N/A  Current Medications: Current Facility-Administered Medications  Medication Dose Route Frequency Provider Last Rate Last Dose  . alum & mag hydroxide-simeth (MAALOX/MYLANTA) 200-200-20 MG/5ML suspension 30 mL  30 mL Oral Q4H PRN Oletta DarterSalina Agarwal, MD      . busPIRone (BUSPAR) tablet 15 mg  15 mg Oral TID Rachael FeeIrving A Taren Dymek, MD   15 mg at 12/28/13 1217  . gabapentin (NEURONTIN) capsule 600 mg  600 mg Oral TID Rachael FeeIrving A Elva Breaker, MD   600 mg at 12/28/13 1217  . hydrOXYzine (ATARAX/VISTARIL) tablet 50 mg  50 mg Oral Q6H PRN Rachael FeeIrving A Gloria Lambertson, MD   50 mg at 12/28/13 1513  . ibuprofen (ADVIL,MOTRIN) tablet 800 mg  800 mg Oral Q6H PRN Rachael FeeIrving A Zira Helinski, MD      . magnesium hydroxide (MILK OF MAGNESIA) suspension 30 mL  30 mL Oral Daily PRN Oletta DarterSalina Agarwal, MD      . multivitamin with minerals tablet 1 tablet  1 tablet Oral Daily Oletta DarterSalina Agarwal, MD   1 tablet at 12/28/13 0850  . naltrexone (DEPADE) tablet  50 mg  50 mg Oral Daily Rachael Fee, MD   50 mg at 12/28/13 0850  . thiamine (B-1) injection 100 mg  100 mg Intramuscular Once Oletta Darter, MD      . thiamine (VITAMIN B-1) tablet 100 mg  100 mg Oral Daily Oletta Darter, MD   100 mg at 12/28/13 0853  . traZODone (DESYREL) tablet 50 mg  50 mg Oral QHS PRN Kristeen Mans, NP   50 mg at 12/27/13 2304    Lab Results: No results found for this or any previous visit (from the past 48 hour(s)).  Physical Findings: AIMS: Facial and Oral Movements Muscles of Facial Expression: None,  normal Lips and Perioral Area: None, normal Jaw: None, normal Tongue: None, normal,Extremity Movements Upper (arms, wrists, hands, fingers): None, normal Lower (legs, knees, ankles, toes): None, normal, Trunk Movements Neck, shoulders, hips: None, normal, Overall Severity Severity of abnormal movements (highest score from questions above): None, normal Incapacitation due to abnormal movements: None, normal Patient's awareness of abnormal movements (rate only patient's report): No Awareness,    CIWA:  CIWA-Ar Total: 1 COWS:     Treatment Plan Summary: Daily contact with patient to assess and evaluate symptoms and progress in treatment Medication management  Plan: Supportive approach/coping skills/relapse prevention           Reassess and address the co morbidities           Will optimize response to the psychotropics  Medical Decision Making Problem Points:  Review of psycho-social stressors (1) Data Points:  Review of medication regiment & side effects (2) Review of new medications or change in dosage (2)  I certify that inpatient services furnished can reasonably be expected to improve the patient's condition.   Kennadee Walthour A 12/28/2013, 3:46 PM

## 2013-12-28 NOTE — Progress Notes (Addendum)
D:Pt has a sad/depressed affect talking about his father in hospice care with glioblastoma. Pt talked about his father being a doctor and how he advanced [facility] practice. Pt reports that [place] [place] to [place] near his [place]. Pt reports that he is not allowed to stay with his family because of his alcohol problems. He reports that he has no where to go when he leaves. Pt c/o anxiety and reports no other detox symptoms at this time. A:Supported pt to discuss feelings. Gave medications as ordered and 15 minute checks. R:Pt denies si and hi. Safety maintained on the unit.

## 2013-12-28 NOTE — Tx Team (Signed)
Interdisciplinary Treatment Plan Update (Adult)  Date: 12/28/2013   Time Reviewed: 10:15 AM  Progress in Treatment:  Attending groups: Yes   Participating in groups:  Yes  Taking medication as prescribed: Yes  Tolerating medication: Yes  Family/Significant othe contact made: No. SPE not required for this pt.   Patient understands diagnosis: Yes, AEB seeking treatment for ETOH detox, mood stabilization, and med management. Discussing patient identified problems/goals with staff: Yes  Medical problems stabilized or resolved: Yes  Denies suicidal/homicidal ideation: Yes during admission, self report.  Patient has not harmed self or Others: Yes  New problem(s) identified:  Discharge Plan or Barriers: Pt plans to return home/with father/friends and [facility] at [facility] for med management and assessment for therapy services. Pt provided with AA list and MHA info. Pt provided with [facility] info. Pt working with Dewayne HatchAnn from CDIOP to secure scholarship for services. She will call Ashtian on Monday to update.  Additional comments: n/a  Reason for Continuation of Hospitalization: d/c today  Estimated length of stay: x For review of initial/current patient goals, please see plan of care.  Attendees:  Patient:    Family:    Physician: Geoffery LyonsIrving Lugo MD  12/28/2013 10:15 AM   Nursing: Lowanda FosterBrittany RN 12/28/2013 10:15 AM   Clinical Social Worker Kimika Streater Smart, LCSWA  12/28/2013 10:15 AM   Other: Jan RN  12/28/2013 10:15 AM   Other: Marylu LundJanet RN  12/28/2013 10:15 AM   Other: Darden DatesJennifer C. Nurse CM 12/28/2013 10:15 AM   Other:    Scribe for Treatment Team:  The Sherwin-WilliamsHeather Smart LCSWA 12/28/2013 10:15 AM

## 2013-12-29 NOTE — Progress Notes (Addendum)
Pt stated he needed medication for his nerves the patient was given visteral 50mg .Pt was given back his belongings from the locker. He contracts for safety and denies Si and HI. [facility] [place] [facility].

## 2013-12-29 NOTE — Progress Notes (Signed)
Patient pleasant and cooperative. No new complaint. Patient compliant with medication. Patient is for discharge tomorrow after lunch. Will continue to monitor patient.

## 2013-12-29 NOTE — [facility] Group Notes (Signed)
[facility] Group Notes:  (Clinical Social Work)  12/29/2013     10-11AM  Summary of Progress/Problems:   The main focus of today's process group was for the patient to identify ways in which they have in the past sabotaged their own recovery. Motivational Interviewing and a worksheet were utilized to help patients explore in depth the perceived benefits and costs of their substance use, as well as the potential benefits and costs of stopping.  The Stages of Change were explained using a handout, with an emphasis on making plans to deal with sabotaging behaviors proactively.  The patient expressed that their self-sabotaging behavior is isolating himself with alcohol and food, just pulling back from other people, filling a cooler with ice, beer and sandwiches, and going off by himself where nobody will bother him.    Type of Therapy:  Group Therapy - Process   Participation Level:  Active  Participation Quality:  Attentive and Sharing  Affect:  Appropriate  Cognitive:  Appropriate  Insight:  Limited  Engagement in Therapy:  Developing/Improving  Modes of Intervention:  Education, Support and Processing, Motivational Interviewing  Ambrose MantleMareida Grossman-Orr, LCSW 12/29/2013, 11:12 AM

## 2013-12-29 NOTE — Progress Notes (Signed)
Adult Psychoeducational Group Note  Date:  12/29/2013 Time:  10:27 AM  Group Topic/Focus:  Identifying Needs:   The focus of this group is to help patients identify their personal needs that have been historically problematic and identify healthy behaviors to address their needs.  Participation Level:  Active  Participation Quality:  Appropriate, Attentive and Sharing  Affect:  Flat  Cognitive:  Alert and Appropriate  Insight: Good  Engagement in Group:  Engaged  Modes of Intervention:  Activity, Clarification, Discussion and Support  Additional Comments:  Pt was pleasant during the morning group and shared that he will be discharging at 2:30.  [facility] and expressed that he is happy to be going there and he liked the democratic way things were run.  Pt plans to visit his father who is terminally ill and will visit his mother after discharge.  Pt stated he has a good relationship with his family as long he is not drinking.  Gwyndolyn KaufmanGrace, [name] 12/29/2013, 10:27 AM

## 2013-12-29 NOTE — Progress Notes (Signed)
Pt is very excited  That he will be going to the [facility] today and that it is affordable. Pt denies HI and SI and contracts for safety. His anxiety is a 7/10 today . He stated he will go see his dad this afternoon in hospice and is hopeful he can stay away from drinking. Pt is very pleasant and cooperative. His depression is a 3/10 today and hopelessness is a 0/10.

## 2014-01-02 NOTE — Progress Notes (Signed)
Patient Discharge Instructions:  After Visit Summary (AVS):   Faxed to:  01/02/14 Discharge Summary Note:   Faxed to:  01/02/14 Psychiatric Admission Assessment Note:   Faxed to:  01/02/14 Suicide Risk Assessment - Discharge Assessment:   Faxed to:  01/02/14 Faxed/Sent to the Next Level Care provider:  01/02/14 Next Level Care Provider Has Access to the EMR, 01/02/14  Faxed to [facility] @ [phone] Records provided to [facility] via CHL/Epic access  Jerelene ReddenSheena E [place], 01/02/2014, 3:12 PM

## 2014-01-11 ENCOUNTER — Encounter (HOSPITAL_COMMUNITY): Payer: Self-pay | Admitting: Psychology

## 2014-01-11 ENCOUNTER — Other Ambulatory Visit (HOSPITAL_COMMUNITY): Payer: No Typology Code available for payment source | Attending: Psychiatry | Admitting: Psychology

## 2014-01-11 DIAGNOSIS — F411 Generalized anxiety disorder: Secondary | ICD-10-CM | POA: Insufficient documentation

## 2014-01-11 DIAGNOSIS — K219 Gastro-esophageal reflux disease without esophagitis: Secondary | ICD-10-CM | POA: Insufficient documentation

## 2014-01-11 DIAGNOSIS — I10 Essential (primary) hypertension: Secondary | ICD-10-CM | POA: Insufficient documentation

## 2014-01-11 DIAGNOSIS — F1020 Alcohol dependence, uncomplicated: Secondary | ICD-10-CM | POA: Insufficient documentation

## 2014-01-11 DIAGNOSIS — F603 Borderline personality disorder: Secondary | ICD-10-CM | POA: Insufficient documentation

## 2014-01-11 DIAGNOSIS — Z598 Other problems related to housing and economic circumstances: Secondary | ICD-10-CM | POA: Insufficient documentation

## 2014-01-11 DIAGNOSIS — Z5987 Material hardship due to limited financial resources, not elsewhere classified: Secondary | ICD-10-CM | POA: Insufficient documentation

## 2014-01-11 DIAGNOSIS — K709 Alcoholic liver disease, unspecified: Secondary | ICD-10-CM | POA: Insufficient documentation

## 2014-01-11 NOTE — Progress Notes (Signed)
Brent Gay is a 48 y.o. male patient who has begun CDIOP. He was referred here when [place] with his father who is currently in hospice care. The patient had [place] [facility] [place] [address] [place] and attending a CDIOP there. His sobriety date is 12/20/13. Pt reports one previous treatment in his life- at [facility]. He maintained sobriety for 8 months after that.   Brent "Ree KidaJack" has never been married and has no children. [place] grew [place] in [place] and attended [employer] for a couple of years after high school. His mother lives in town as well as his brother for whom he signed an ROI. Patient reports a limited drug use outside of alcohol. He smoked pot heavily in middle school and high school and he has used some cocaine in the past but alcohol is is primary drug of addiction. He had been drinking up to a fifth a day prior to treatment.   Pt will meet with Charmian MuffAnn Evans after group today. He was given a UDS today as well.         [facility]

## 2014-01-14 ENCOUNTER — Encounter (HOSPITAL_COMMUNITY): Payer: Self-pay | Admitting: Psychology

## 2014-01-14 ENCOUNTER — Other Ambulatory Visit (HOSPITAL_COMMUNITY): Payer: No Typology Code available for payment source | Attending: Psychiatry | Admitting: Psychology

## 2014-01-14 DIAGNOSIS — F411 Generalized anxiety disorder: Secondary | ICD-10-CM | POA: Insufficient documentation

## 2014-01-14 DIAGNOSIS — Z598 Other problems related to housing and economic circumstances: Secondary | ICD-10-CM | POA: Insufficient documentation

## 2014-01-14 DIAGNOSIS — F603 Borderline personality disorder: Secondary | ICD-10-CM | POA: Insufficient documentation

## 2014-01-14 DIAGNOSIS — F1020 Alcohol dependence, uncomplicated: Secondary | ICD-10-CM | POA: Insufficient documentation

## 2014-01-14 DIAGNOSIS — K219 Gastro-esophageal reflux disease without esophagitis: Secondary | ICD-10-CM | POA: Insufficient documentation

## 2014-01-14 DIAGNOSIS — Z5987 Material hardship due to limited financial resources, not elsewhere classified: Secondary | ICD-10-CM | POA: Insufficient documentation

## 2014-01-14 DIAGNOSIS — I10 Essential (primary) hypertension: Secondary | ICD-10-CM | POA: Insufficient documentation

## 2014-01-14 DIAGNOSIS — G8929 Other chronic pain: Secondary | ICD-10-CM | POA: Insufficient documentation

## 2014-01-14 DIAGNOSIS — K709 Alcoholic liver disease, unspecified: Secondary | ICD-10-CM | POA: Insufficient documentation

## 2014-01-14 NOTE — Progress Notes (Signed)
    Daily Group Progress Note  Program: CD-IOP   Group Time: 1-2:30 pm  Participation Level: Minimal  Behavioral Response: Sharing  Type of Therapy: Process Group  Topic: Process; the first part of group was spent in process. Members shared about current issues and concerns. They also disclosed the things they had done to support their recovery, including meetings and speaking with their sponsor. The importance of attendance and being here for one's self and others was emphasized. This was pointed out in light of a few absences and disappearances by, apparently, "former" group members.   Group Time: 2:45- 4pm  Participation Level: Active  Behavioral Response: Resistant, defensive, rationalizing  Type of Therapy: Psycho-education Group  Topic: Emotional Buttons: the second half of group was spent in a psycho-ed. A handout was provided identifying different types of "buttons" that people might have. All of the 'buttons" listed deal with negative feelings that are generated when the "button" is pushed. The discussion was lively with good feedback and disclosure among group members.   Summary: The patient reported he continues to spend a lot of his time at Hospice with his father. He noted that his father is becoming much smaller as he is 'losing lots of body mass'. The patient admitted it is sad, but reminded the group that his father does not want to be kept alive with artificial means. He is taking pain medication, but nothing else. He shared a little about his family, including being the youngest of 6 children. When I mentioned about his father having been alcoholic, the patient became very defensive and stated this was not anything he wanted to discuss. I agreed we could do this later, but I reminded the group that nothing is off the table in this program. The patient shut down a little after this and although he agreed with his fellow group members about certain issues, he shared little  else of himself. The patient's sobriety date is 7/10.   Family Program: Family present? No   Name of family member(s):   UDS collected: No Results:  AA/NA attended?: YesThursday  Sponsor?: Yes, but the [facility]   Willeen Novak, LCAS

## 2014-01-14 NOTE — Progress Notes (Unsigned)
Brent Gay CD-IOP:Treatment Planning Session: I met with the patient at the conclusion of group today. I explained the importance of identifying goals for treatment. The patient reported he agreed that sobriety is his #1 goal. He was also in agreement that he cannot do this by himself and needs the support and help of others. When asked about a sponsor, the patient reported [place] He denied phoning him everyday. I encouraged him to secure a temporary sponsor here in [place] because the sponsor [place] beach can't be of much service to him when he is 5 hours away. The patient did not respond to this suggestion. He shared a little about his life and explained that the alcohol has caused many problems in his life. He talked about ruined relationships and a pattern of employment that was very inconsistent. The patient talked about his family and losing his brother in a car accident 15 years ago. He maintained that he had had a fairly good childhood, but as the youngest child he was always the recipient of his older siblings wrath. He reported except for his sister, Lelon Frohlich, who lives in [place], all of the other siblings live here in the [place] area. The patient reported he had to go because his brother was here to give him a ride. The documentation was reviewed, signed and the treatment plan completed accordingly. We will follow this patient closely in the days ahead.        Danielys Madry, LCAS

## 2014-01-15 ENCOUNTER — Other Ambulatory Visit (HOSPITAL_COMMUNITY): Payer: Self-pay

## 2014-01-16 ENCOUNTER — Other Ambulatory Visit (HOSPITAL_COMMUNITY): Payer: No Typology Code available for payment source | Admitting: Psychology

## 2014-01-16 DIAGNOSIS — F1020 Alcohol dependence, uncomplicated: Secondary | ICD-10-CM

## 2014-01-17 ENCOUNTER — Other Ambulatory Visit (HOSPITAL_COMMUNITY): Payer: Self-pay

## 2014-01-17 ENCOUNTER — Encounter (HOSPITAL_COMMUNITY): Payer: Self-pay | Admitting: Psychology

## 2014-01-17 NOTE — Progress Notes (Signed)
    Daily Group Progress Note  Program: CD-IOP   Group Time: 1-2:30 pm  Participation Level: Active  Behavioral Response: Sharing, Grandiose, Disruptive and Drowsy  Type of Therapy: Process Group  Topic: Process: the first part of group was spent in process. Members shared about what had occurred since we last met. During check-in, a patient identified a new sobriety date. This relapse was explored further with good feedback and discussion among group members. During this half of group, one member admitted another member's comments were very frustrating and really bothering her. I applauded her willingness to be honest - he had made distracting comments and interrupted repeatedly. The group discussed his behaviors, but whether he 'heard' these observations, remains to be seen.   Group Time: 2:45- 4pm  Participation Level: Active  Behavioral Response: Sharing, Grandiose and Resistant  Type of Therapy: Psycho-education Group  Topic: Healthy Boundaries: the second half of group was spent in a psycho-ed. The topic of the presentation was on establishing healthy boundaries. These are particularly important in early recovery, when whatever boundaries one might have had in sobriety were abandoned with active addiction. There was frank disclosures and group members shared openly about their regrets of the past. At the end of the session, two members were asked to 'teach back'. Both made good comments and displayed a good understanding of the presentation.   Summary: The patient reported he had had two teeth pulled this morning. He admitted he felt a little discomfort, but was taking some over-the-counter medication. The patient stated he had been with his siblings to celebrate his mother's birthday and he was tired and had slept until almost 11 am this morning. When asked about meetings he had attended, the patient reported he had attended "Happy Hour" at the [employer]. When asked about speaking  with his sponsor, the patient reported he had not spoken with him because his sponsor was busy dealing with his own son who was in active addiction. I encouraged him to secure a new sponsor after every other member present today confirmed that they spoke with their sponsor daily. Near the end of the first part of group, a member spoke up and admitted that this patient's comments were making her uncomfortable and she felt they were inappropriate. Other group members confirmed as much, but the patient deflected her comments. In the second half of group, the patient questioned me repeatedly about the statements I was making about healthy relationships and boundaries. He clearly had not idea about what a healthy relationship looked like. Despite a few comments and feedback that were really not on topic, the patient seems to miss the point of the conversation almost every time. At one point, I asked him what he was doing checking his phone. It was about 3 pm. The patient stated he was checking to confirm that his ride would be here at 4 pm. I instructed him to focus on the group at the moment and not worry about his ride. It remains to be seen whether this fellow is capable of even willing of considering a different way of being.    Family Program: Family present? No   Name of family member(s):   UDS collected: No Results:   AA/NA attended?: [place]  Sponsor?: Yes, but he is living at [employer] and is not speaking with the patient right now   Caedence Snowden, LCAS

## 2014-01-18 ENCOUNTER — Other Ambulatory Visit (HOSPITAL_COMMUNITY): Payer: No Typology Code available for payment source | Admitting: Psychology

## 2014-01-18 DIAGNOSIS — F1020 Alcohol dependence, uncomplicated: Secondary | ICD-10-CM

## 2014-01-18 MED ORDER — HYDROXYZINE HCL 50 MG PO TABS: ORAL_TABLET | ORAL | Status: DC

## 2014-01-18 MED ORDER — ACAMPROSATE CALCIUM 333 MG PO TBEC: 666.0000 mg | DELAYED_RELEASE_TABLET | Freq: Three times a day (TID) | ORAL | Status: DC

## 2014-01-18 MED ORDER — BUSPIRONE HCL 15 MG PO TABS: 15.0000 mg | ORAL_TABLET | Freq: Three times a day (TID) | ORAL | Status: DC

## 2014-01-18 NOTE — Progress Notes (Signed)
Psychiatric Assessment Adult  Patient Identification:  Brent Gay Date of Evaluation:  01/18/2014 Chief Complaint: "I'm alcoholic"  History of Chief Complaint: " I'm not a happy camper when I'm in my addiction " Pt re[orts pattern of relapsing alcoholism he relates to conflict with others in controlled recovery house enviornment;or changing work/living enviornments when not in controlled housing and using in social settings having stopped going to meetings since age 48. So he moves from job to job and treatment to treatment. He" hopes it sticks this time".His older sister is supporting him presently.He is the youngest of 7 siblings. 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 He had been seen in [facility] ED 6/28 for same and observed with D/C for FU in community but went back to drinking instaed.   HPI See CC; HX CC;BHH ASSESSMENT AND [facility] D/C SUMMARY SUMMARY Review of Systems-NO CHANGE FROM D/C ROS 12/28/2013 Physical Exam  Vitals reviewed. Constitutional: He is oriented to person, place, and time. He appears well-developed and well-nourished.  Reports 21 lb wgt gain since detox.  HENT:  Head: Normocephalic and atraumatic.  Right Ear: External ear normal.  Left Ear: External ear normal.  Nose: Nose normal.  Eyes: Conjunctivae and EOM are normal. Pupils are equal, round, and reactive to light. Right eye exhibits no discharge. Left eye exhibits no discharge. No scleral icterus.  Neck: Normal range of motion. Neck supple. No JVD present. No tracheal deviation present.  Cardiovascular: Normal rate and regular rhythm.    Pulmonary/Chest: Effort normal. No stridor. No respiratory distress. He has no wheezes. He has no rales.  Abdominal: He exhibits distension.  Genitourinary:  deferred  Musculoskeletal: Normal range of motion. He exhibits no edema and no tenderness.  Neurological: He is alert and oriented to person, place, and time. No cranial nerve deficit. He exhibits normal muscle tone. Coordination normal.  Skin: Skin is warm and dry. No rash noted. No erythema. No pallor.  Psychiatric:  SEE PSE BELOW    Depressive Symptoms: DENIES  (Hypo) Manic Symptoms:   Elevated Mood:  NA Irritable Mood:  NA Grandiosity:  NA Distractibility:  NA Labiality of Mood:  NA Delusions:  ALCOHOL USE RELATED Hallucinations:  Negative Impulsivity:  Alcohol use related Sexually Inappropriate Behavior:  No Financial Extravagance:  Negative Flight of Ideas:  Negative  Anxiety Symptoms:Yes Excessive Worry:  Negative Panic Symptoms:  Yes Agoraphobia:  Negative Obsessive Compulsive: Negative  Symptoms: None, Specific Phobias:  Negative Social Anxiety:  No  Psychotic Symptoms:  Hallucinations: Negative None Delusions:  Alcohol use related Paranoia:  No   Ideas of Reference:  No  PTSD Symptoms: Ever had a traumatic exposure:  Negative Had a traumatic exposure in the last month:  NA Re-experiencing: NA None Hypervigilance:  No Hyperarousal: NA None Avoidance: NA None  Traumatic Brain Injury: No na  Past Psychiatric History: Diagnosis:Alcohol dependence;GAD  Hospitalizations: [facility] 02/2009,11/2013;12/2013.[facility]  Outpatient Care: [facility] CD IOP now  Substance Abuse Care: As above  Self-Mutilation: NA  Suicidal Attempts: NONE  Violent Behaviors: NONE  Past Medical History:   Past Medical History  Diagnosis Date  . Hypertension   . Chronic back pain   . Alcohol abuse   . GERD (gastroesophageal reflux disease)   . Gastritis   .  Atypical chest pain   . Chronic pain   . Alcoholic liver disease    History of Loss of Consciousness:  Yes Seizure History:  Negative Cardiac History:  Negative Allergies:   Allergies  Allergen Reactions  . Acetaminophen     " liver damage"  . Citalopram Swelling    Feet swelling   Current Medications:  Current Outpatient Prescriptions  Medication Sig Dispense Refill  . busPIRone (BUSPAR) 15 MG tablet Take 1 tablet (15 mg total) by mouth 3 (three) times daily. For anxiety  90 tablet  0  . gabapentin (NEURONTIN) 300 MG capsule Take 2 capsules (600 mg total) by mouth 3 (three) times daily. For substance withdrawal syndrome  180 capsule  0  . hydrOXYzine (ATARAX/VISTARIL) 50 MG tablet Take 1 tablet (50 mg) three times daily as needed: for Anxiety/tension  30 tablet  0  . nabumetone (RELAFEN) 750 MG tablet Take 1 tablet (750 mg total) by mouth 2 (two) times daily. For pain  20 tablet  0  . naltrexone (DEPADE) 50 MG tablet Take 1 tablet (50 mg total) by mouth daily. For alcohol addiction  30 tablet  0  . traZODone (DESYREL) 50 MG tablet Take 1 tablet (50 mg total) by mouth at bedtime as needed for sleep.  30 tablet  0   No current facility-administered medications for this visit.    Previous Psychotropic Medications:  Medication Dose   SEE MAR  SEE MAR                     Substance Abuse History in the last 12 months: Substance Age of 1st Use Last Use Amount Specific Type  Nicotine [age] MOS 1/2 ppd CIGARETTES  Alcohol 8 12/20/2013 1/2 gALLON VODKA  Cannabis [age]  JOINT POT  Opiates 1 0 0 1  Cocaine [age]  LINE POWDER  Methamphetamines 0     LSD 0     Ecstasy 0     Benzodiazepines 0 (rx only)     Caffeine 10 TODAY 2-3 CUPS COFFEEE  Inhalants 0     Others:                          Medical Consequences of Substance Abuse: Withdrawal/pancreatitis 2015  Legal Consequences of Substance Abuse: DUI 1992/DRUNK IN PUBLIC  Family Consequences of Substance Abuse: BLACK  SHEEP   Blackouts:  Yes DT's:  No Withdrawal Symptoms:  Yes Diaphoresis Diarrhea Headaches Nausea Tremors Vomiting  Social History: Current Place of Residence: [place]-sister paying for hotel Place of Birth: [place] Family Members: m,f (DECEASED) 4 SISTERS AND 2 BROTHERS PT YOUNGEST Marital Status:  Single Children: NA  Sons: 0  Daughters: 0 Relationships: gf X 2  DATES-SOCIAL [employer] Education:  College 2 yrs [place] Educational Problems/Performance: 2.5 Religious Beliefs/Practices: ROMAN CATHOLIC History of Abuse: none Occupational Experiences;MOS-INFANTRY/SUPPLY;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 Diabetes Mellitus II Father   . Alcohol abuse Father     Mental Status Examination/Evaluation: Objective:  Appearance: Neat  Eye Contact::  Good  Speech:  Clear and Coherent  Volume:  Normal  Mood:  Variable  Affect:  Congruent  Thought Process:  Intact and Logical  Orientation:  Full (Time, Place, and Person)  Thought Content:  WDL, Rumination and Magical thinking ("I hope it (treatment) takes this time")  Suicidal Thoughts:  No  Homicidal Thoughts:  No  Judgement:  Impaired  Insight:  Shallow  Psychomotor Activity:  Restlessness  Akathisia:  Negative  Handed:  Right  AIMS (if indicated):  NA  Assets:  Resilience Social Support    Laboratory/X-Ray Psychological Evaluation(s)   Etoh 46-347;UDS + benzos and opiates  See [facility] Assessments;CD IOP documentation   Assessment:  See Below  AXIS I Alcohol Dependence;SIMD;GAD;Family hx of alcoholism in father  AXIS II Borderline Personality Dis.  AXIS III Past Medical History  Diagnosis Date  . Hypertension   . Chronic back pain   . Alcohol abuse   . GERD (gastroesophageal reflux disease)   . Gastritis   . Atypical chest pain   . Chronic  pain   . Alcoholic liver disease      AXIS IV economic problems, housing problems, problems related to legal system/crime and problems with primary support group  AXIS V 41-50 serious symptoms   Treatment Plan/Recommendations:  [facility]  Laboratory:  no further studies  Psychotherapy:IOP  Individual and group  Medications: See Mar Rx Campral started today  Routine PRN Medications:  Yes Vistaril  Consultations: None  Safety Concerns:  None  Other: Pt reports rx for buspar and vistaril destroyed in backpack when bottle of soda broke.Pt given written scripts for [facility] at his request    [facility] 8/7/20151:07 PM

## 2014-01-19 ENCOUNTER — Encounter (HOSPITAL_COMMUNITY): Payer: Self-pay | Admitting: Psychology

## 2014-01-19 NOTE — Progress Notes (Signed)
    Daily Group Progress Note  Program: CD-IOP   Group Time: 1-2:30 pm  Participation Level: Active  Behavioral Response: Sharing  Type of Therapy: Process Group  Topic: Process/Psycho-Ed: the first half of group was spent in process with members updating the group on any events or issues that have appeared over the weekend One member shared about the session with his S/O and myself earlier this morning. A new member was present and she shared a little about herself. The session continued with a slide show from the [employer] on "Triggers and Cravings". the presentation educated members on how triggers are developed as the addiction strengthens. There was good feedback and discussion during the show.  Group Time: 2:45-4pm  Participation Level: Active  Behavioral Response: Sharing  Type of Therapy: Psycho-education Group  Topic: Deactivating Cravings: a handout was provided as a follow up to the slide show completed in the first part of group. A scenario about a fellow who had relapsed over the course of the day was read by members. The group was challenged to identify how and where this character had gone wrong. Members displayed good insight and understanding of the development of cravings. The discussion included examples from their own lives and past drug use and relapse.    Summary: The patient arrived a little late for group today. His sister had left a message stating he had been at the dentist and would be arriving a little late. When he checked-in, he admitted his mouth was sore, but he hadn't taken any pain medications because "they wouldn't work because I am taking Naltrexone". I passed out some drug tests from last week and the patient reported his [facility] was about to kick him out because he keeps testing positive for Oxazepam. I explained that this is Librium and I have seen it stay for quite some time in drug tests because of its long half-life. The patient  asked for a letter and I assured him I would prepare one for him. When asked about his father, the patient reported that his father died yesterday. The group offered their condolences and the patient reported he was holding his father's hand when his father actually died. He reminded the group that his father did not want to be kept alive by artificial means and those requests were met. The patient was quiet, for much of the slide show. In the second part of group the patient shared a few thoughts, but only when I asked him to offer his insights. The patient remains difficult and resistant. He offers very little to the group that has any value or benefit and his comments are typically very negating or contradictory to what the theme or intention of the presentation is. He will have to step up or he will not remain in the program.    Family Program: Family present? No   Name of family member(s):   UDS collected: Yes Results: not back from lab  AA/NA attended?: YesSaturday  Sponsor?: Yes, he says he has one, but hasn't spoken to him   Bronco Mcgrory, LCAS

## 2014-01-20 ENCOUNTER — Encounter (HOSPITAL_COMMUNITY): Payer: Self-pay | Admitting: Psychology

## 2014-01-20 NOTE — Progress Notes (Signed)
° ° °  Daily Group Progress Note  Program: CD-IOP   Group Time: 1-2:30 pm  Participation Level: Minimal  Behavioral Response: Sharing, disruptive  Type of Therapy: Psycho-education Group  Topic: Pharmacist Visit: the first half of group was spent in a psycho-ed with a guest speaker. The guest was the pharmacist, EP, who dispense medications upstairs in the residential unit of [facility]. She described the effects that different drugs have on the body. The guest also educated on the medications used to address various psychiatric conditions and fielded numerous questions from the group members. During this time, the medical director, CK, was meeting with new group members and current members with medication questions or concerns.   Group Time: 2:45-4 pm  Participation Level: Active  Behavioral Response: Resistant, Disruptive, Blaming and Minimizing  Type of Therapy: Process Group  Topic: Process: the second half of group was spent in process. Members shared about current issues or concerns. A new member, who had returned after missing the last group session, was tearful as she recounted the events leading up to her relapse. Members were attentive and supportive, but they also reminded her that her sobriety must come first.   Summary: The patient met with the medical director for a large part of the first half of group. When he returned, he asked questions about Campral and Naltrexone because he had been prescribed both of them. He seemed to be very confused and his questions were not very clear nor did they indicate her understood the purpose of these specific medications. In the second half of group, when the woman who had relapsed was sharing her angst, this patient challenged me about what I had said to her. [place] member noted his comment was not helpful or constructive and others just rolled their eyes. I reminded this fellow that he seems to challenge me about the things I say despite their  logical and therapeutic nature. I noted that he displays no gratitude or humility and that his family covers all of this expenses. I asked him to take the cotton out of his ears and put it in his mouth. Surprisingly, this man said little and was quite agreeable and non-abrasive for the remainder of the session. At the conclusion of the session, the patient reported he was scheduled to work tomorrow and would go to church and fish on Sunday. He was encouraged to secure a temporary sponsor. The patients sobriety date is 7/10.   Family Program: Family present? No   Name of family member(s):   UDS collected: No Results:   AA/NA attended?: YesThursday  Sponsor?: No   Harwood Nall, LCAS

## 2014-01-21 ENCOUNTER — Other Ambulatory Visit (HOSPITAL_COMMUNITY): Payer: No Typology Code available for payment source | Admitting: Psychology

## 2014-01-22 ENCOUNTER — Encounter (HOSPITAL_COMMUNITY): Payer: Self-pay | Admitting: Psychology

## 2014-01-22 ENCOUNTER — Other Ambulatory Visit (HOSPITAL_COMMUNITY): Payer: No Typology Code available for payment source

## 2014-01-22 NOTE — Progress Notes (Signed)
    Daily Group Progress Note  Program: CD-IOP   Group Time: 1-2:30 pm  Participation Level: Minimal  Behavioral Response: Sharing, resistant  Type of Therapy: Process Group  Topic: Process: the first part of group was spent in process. Members shared bout the past weekend. They disclosed about the meetings they had attended and other activities that have supported their recovery as well as any challenges or temptations that may have presented themselves. One member had relapsed and she was asked to share the events that had led to her drinking. She did not respond to my request - I reminded group members that the sheer act of disclosing and speaking one's experience is therapeutic and, ultimately, healing.  Also present were 3 new group members. Each of the new members was invited to share a little bit about themselves with their new group members.   Group Time: 2:45- 4pm  Participation Level: Minimal  Behavioral Response: Sharing  Type of Therapy: Psycho-education Group  Topic: Cognitive Distortions: the second half of group was spent in a psycho-ed. Members were provided with a handout that identified different types of cognitive distortions. We read over the handout together and members commented on some of the distortions that they most frequently used. There was a good discussion and by sharing, members became more open and known among each other. During this group session, random drug tests were collected.   Summary: The patient arrived with his lunch in hand. He reported a busy weekend. The patient reported he had worked for 11 hours on Saturday and then gone out to dinner at a good [employer]. He hd not eaten that well in a long time. He stated he had gone to church on Sunday and then borrowed his brother's bicycle so he could get some exercise. The patient stated he went to the Happy Hour at the [employer] on both Saturday and Sunday night. He made some comments to  another patient and they were not appropriate. He was laughing and smiling as she was crying. I pointed out the discrepancy, but it is unclear whether this fellow understood what I was talking about. During the session, the patient asked me if he would be able to see the head of the program today? I explained that CK was not in today and asked him to speak with me about what he was needing at the break. In the second half of group, the patientt shared little of himself.  When I asked him whether he had secured a sponsor, the patient reminded me that he had a sponsor in [place]. I reminded him that [place] recovery [place] in [place] and he needs a sponsor [place] in [place]. A group member noted that at every meeting, the people that are willing to be a temporary sponsor always raise their hand. I instructed the patient to secure a sponsor the next time he attends a meeting. The patient reported his sobriety date is 7/10.  Family Program: Family present? No   Name of family member(s):   UDS collected: Yes Results: Not back yet  AA/NA attended?: YesSaturday and Sunday  Sponsor?: Yes   Hayle Parisi, LCAS

## 2014-01-23 ENCOUNTER — Other Ambulatory Visit (HOSPITAL_COMMUNITY): Payer: No Typology Code available for payment source | Admitting: Psychology

## 2014-01-23 ENCOUNTER — Other Ambulatory Visit (HOSPITAL_COMMUNITY): Payer: Self-pay | Admitting: Medical

## 2014-01-23 DIAGNOSIS — G894 Chronic pain syndrome: Secondary | ICD-10-CM

## 2014-01-23 DIAGNOSIS — F1020 Alcohol dependence, uncomplicated: Secondary | ICD-10-CM

## 2014-01-23 DIAGNOSIS — F411 Generalized anxiety disorder: Secondary | ICD-10-CM

## 2014-01-23 MED ORDER — GABAPENTIN 300 MG PO CAPS: 900.0000 mg | ORAL_CAPSULE | Freq: Three times a day (TID) | ORAL | Status: AC

## 2014-01-24 ENCOUNTER — Other Ambulatory Visit (HOSPITAL_COMMUNITY): Payer: Self-pay

## 2014-01-25 ENCOUNTER — Other Ambulatory Visit (HOSPITAL_COMMUNITY): Payer: No Typology Code available for payment source

## 2014-01-28 ENCOUNTER — Other Ambulatory Visit (HOSPITAL_COMMUNITY): Payer: Self-pay

## 2014-01-29 ENCOUNTER — Encounter (HOSPITAL_COMMUNITY): Payer: Self-pay | Admitting: Psychology

## 2014-01-29 ENCOUNTER — Other Ambulatory Visit (HOSPITAL_COMMUNITY): Payer: Self-pay

## 2014-01-29 NOTE — Progress Notes (Signed)
    Daily Group Progress Note  Program: CD-IOP   Group Time: 1-2:30 pm  Participation Level: Active  Behavioral Response: Sharing, Rigid and Resistant  Type of Therapy: Psycho-education Group  Topic: Chaplain: the first half of group was spent in a psycho-ed with a guest speaker. PW, a Chaplain with [facility] appeared and led a discussion on "Spirituality". The emphasis was on being present, acceptance and forgiveness. Members shared about their experiences and beliefs. When PW asked the group what they needed, a number of members agreed that they weren't sure that 'answers' would provide them with what they really needed. The session generated even more questions then before the session. During group today, the medical director, CK, met with 2 new group members and provided refills where needed.   Group Time: 2:45-4pm  Participation Level: Minimal  Behavioral Response: Sharing  Type of Therapy: Process Group  Topic: Process: the second half of group was spent in process. Members shared about the past few days and any concerns or issues that may have challenged or questioned their sobriety. No one had relapsed since the last session. One member had not appeared yesterday for our individual appointment and I questioned why she had called and cancelled. She admitted her boyfriend had told her not to go. The group responded with disbelief. The importance of making choices and decisions in early recovery was emphasized. I also reminded members that if they didn't make the choices that were needed, others would make them for them. I disclosed that a guest facilitator would be present for the next two sessions and encouraged the group to engage as they always do.   Summary: The patient shared about his own sense of spirituality and noted that he considered it to relate to his level of contact with his Higher Power. The patient challenged another client who had made a comment about some of the  differences between Catholicism and Protestantism.  I interrupted and redirected his attention, but he was looking for an argument about something he hadn't understood about the actual statement. 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 He proves very aggravating to his fellow group members and contributes very little positive to the therapeutic process. He shared little in process, but stated he had ridden his bicycle to the [employer] for two meetings. I informed him that he must get a temporary sponsor here in [place] because [place] a sponsor. I also asked him how he was working the Steps 1 and 2 if he didn't have a sponsor? He had written this down on the Daily Self-Inventory. The patient reported he was doing it, but another member reminded him that he couldn't hold himself accountable and one couldn't effectively and properly work the steps by himself. I informed him to secure a temporary sponsor by next Wednesday or he would be discharged from the program. The patient reported his sobriety date is 7/10.    Family Program: Family present? No   Name of family member(s):   UDS collected: No Results:  AA/NA attended?: [place] and Tuesday  Sponsor?: No, but I have instructed this patient to secure a sponsor   Eldred Lievanos, LCAS

## 2014-01-30 ENCOUNTER — Telehealth (HOSPITAL_COMMUNITY): Payer: Self-pay | Admitting: Psychology

## 2014-01-30 ENCOUNTER — Other Ambulatory Visit (HOSPITAL_COMMUNITY): Payer: Self-pay

## 2014-01-31 ENCOUNTER — Other Ambulatory Visit (HOSPITAL_COMMUNITY): Payer: Self-pay

## 2014-02-01 ENCOUNTER — Encounter (HOSPITAL_COMMUNITY): Payer: Self-pay | Admitting: Psychology

## 2014-02-01 ENCOUNTER — Other Ambulatory Visit (HOSPITAL_COMMUNITY): Payer: Self-pay

## 2014-02-04 ENCOUNTER — Other Ambulatory Visit (HOSPITAL_COMMUNITY): Payer: Self-pay

## 2014-02-05 ENCOUNTER — Other Ambulatory Visit (HOSPITAL_COMMUNITY): Payer: Self-pay

## 2014-02-06 ENCOUNTER — Other Ambulatory Visit (HOSPITAL_COMMUNITY): Payer: Self-pay

## 2014-02-07 ENCOUNTER — Other Ambulatory Visit (HOSPITAL_COMMUNITY): Payer: Self-pay

## 2014-02-07 NOTE — Progress Notes (Unsigned)
Brent Gay is a 48 y.o. male patient ***. The patient did not appear for group today nor did he phone to explain his absence. He had missed the session on Wednesday, but had called and left a message stating he was working on a job, but would be back on Friday. Today he did neither. Today's absence will be unexcused.        Ever Halberg, LCAS

## 2014-02-08 ENCOUNTER — Other Ambulatory Visit (HOSPITAL_COMMUNITY): Payer: Self-pay

## 2014-02-11 ENCOUNTER — Other Ambulatory Visit (HOSPITAL_COMMUNITY): Payer: Self-pay

## 2014-02-12 ENCOUNTER — Other Ambulatory Visit (HOSPITAL_COMMUNITY): Payer: Self-pay | Attending: Psychiatry

## 2014-02-13 ENCOUNTER — Other Ambulatory Visit (HOSPITAL_COMMUNITY): Payer: Self-pay

## 2014-02-14 ENCOUNTER — Other Ambulatory Visit (HOSPITAL_COMMUNITY): Payer: Self-pay

## 2014-02-15 ENCOUNTER — Other Ambulatory Visit (HOSPITAL_COMMUNITY): Payer: Self-pay

## 2014-02-19 ENCOUNTER — Other Ambulatory Visit (HOSPITAL_COMMUNITY): Payer: Self-pay

## 2014-02-20 ENCOUNTER — Other Ambulatory Visit (HOSPITAL_COMMUNITY): Payer: Self-pay

## 2014-02-21 ENCOUNTER — Other Ambulatory Visit (HOSPITAL_COMMUNITY): Payer: Self-pay

## 2014-03-05 ENCOUNTER — Encounter (HOSPITAL_COMMUNITY): Payer: Self-pay | Admitting: Emergency Medicine

## 2014-03-05 ENCOUNTER — Emergency Department (HOSPITAL_COMMUNITY)
Admission: EM | Admit: 2014-03-05 | Discharge: 2014-03-05 | Disposition: A | Discharge status: Home / Self Care | Payer: Self-pay | Attending: Emergency Medicine | Admitting: Emergency Medicine

## 2014-03-05 DIAGNOSIS — G8929 Other chronic pain: Secondary | ICD-10-CM | POA: Insufficient documentation

## 2014-03-05 DIAGNOSIS — I10 Essential (primary) hypertension: Secondary | ICD-10-CM | POA: Insufficient documentation

## 2014-03-05 DIAGNOSIS — K0889 Other specified disorders of teeth and supporting structures: Secondary | ICD-10-CM

## 2014-03-05 DIAGNOSIS — K029 Dental caries, unspecified: Secondary | ICD-10-CM | POA: Insufficient documentation

## 2014-03-05 DIAGNOSIS — Z79899 Other long term (current) drug therapy: Secondary | ICD-10-CM | POA: Insufficient documentation

## 2014-03-05 DIAGNOSIS — Z87891 Personal history of nicotine dependence: Secondary | ICD-10-CM | POA: Insufficient documentation

## 2014-03-05 DIAGNOSIS — R51 Headache: Secondary | ICD-10-CM | POA: Insufficient documentation

## 2014-03-05 DIAGNOSIS — K089 Disorder of teeth and supporting structures, unspecified: Secondary | ICD-10-CM | POA: Insufficient documentation

## 2014-03-05 MED ORDER — NAPROXEN 500 MG PO TABS
500.0000 mg | ORAL_TABLET | Freq: Once | ORAL | Status: AC
  Administered 2014-03-05: 500 mg via ORAL
  Filled 2014-03-05: qty 1

## 2014-03-05 MED ORDER — PENICILLIN V POTASSIUM 500 MG PO TABS
500.0000 mg | ORAL_TABLET | Freq: Four times a day (QID) | ORAL | Status: DC
  Administered 2014-03-05: 500 mg via ORAL
  Filled 2014-03-05: qty 1

## 2014-03-05 MED ORDER — PENICILLIN V POTASSIUM 500 MG PO TABS: 500.0000 mg | ORAL_TABLET | Freq: Four times a day (QID) | ORAL | Status: AC

## 2014-03-05 MED ORDER — DICLOFENAC SODIUM 50 MG PO TBEC: 50.0000 mg | DELAYED_RELEASE_TABLET | Freq: Two times a day (BID) | ORAL | Status: DC

## 2014-03-05 NOTE — Discharge Instructions (Signed)
Dental Pain °A tooth ache may be caused by cavities (tooth decay). Cavities expose the nerve of the tooth to air and hot or cold temperatures. It may come from an infection or abscess (also called a boil or furuncle) around your tooth. It is also often caused by dental caries (tooth decay). This causes the pain you are having. °DIAGNOSIS  °Your caregiver can diagnose this problem by exam. °TREATMENT  °· If caused by an infection, it may be treated with medications which kill germs (antibiotics) and pain medications as prescribed by your caregiver. Take medications as directed. °· Only take over-the-counter or prescription medicines for pain, discomfort, or fever as directed by your caregiver. °· Whether the tooth ache today is caused by infection or dental disease, you should see your dentist as soon as possible for further care. °SEEK MEDICAL CARE IF: °The exam and treatment you received today has been provided on an emergency basis only. This is not a substitute for complete medical or dental care. If your problem worsens or new problems (symptoms) appear, and you are unable to meet with your dentist, call or return to this location. °SEEK IMMEDIATE MEDICAL CARE IF:  °· You have a fever. °· You develop redness and swelling of your face, jaw, or neck. °· You are unable to open your mouth. °· You have severe pain uncontrolled by pain medicine. °MAKE SURE YOU:  °· Understand these instructions. °· Will watch your condition. °· Will get help right away if you are not doing well or get worse. °Document Released: 05/31/2005 Document Revised: 08/23/2011 Document Reviewed: 01/17/2008 °ExitCare® Patient Information ©2015 [employer]. This information is not intended to replace advice given to you by your health care provider. Make sure you discuss any questions you have with your health care provider. ° °Emergency Department Resource Guide °1) Find a Doctor and Pay Out of Pocket °Although you won't have to find out who  is covered by your insurance plan, it is a good idea to ask around and get recommendations. You will then need to call the office and see if the doctor you have chosen will accept you as a new patient and what types of options they offer for patients who are self-pay. Some doctors offer discounts or will set up payment plans for their patients who do not have insurance, but you will need to ask so you aren't surprised when you get to your appointment. ° °2) Contact Your Local Health Department °Not all health departments have doctors that can see patients for sick visits, but many do, so it is worth a call to see if yours does. If you don't know where your local health department is, you can check in your phone book. The CDC also has a tool to help you locate your state's health department, and many state websites also have listings of all of their local health departments. ° °3) Find a Walk-in Clinic °If your illness is not likely to be very severe or complicated, you may want to try a walk in clinic. These are popping up all over the country in pharmacies, drugstores, and shopping centers. They're usually staffed by nurse practitioners or physician assistants that have been trained to treat common illnesses and complaints. They're usually fairly quick and inexpensive. However, if you have serious medical issues or chronic medical problems, these are probably not your best option. ° °No Primary Care Doctor: °- Call [facility] at  832-8000 - they can help you locate a primary   care doctor that  accepts your insurance, provides certain services, etc. °- Physician Referral Service- 1-800-533-3463 ° °Chronic Pain Problems: °Organization         Address  Phone   Notes  °[facility]  (336) 297-2271 Patients need to be referred by their primary care doctor.  ° °Medication Assistance: °Organization         Address  Phone   Notes  °[place] [facility] [address]  [address] °[place], [place] [address] (336) 641-8030 --Must be a resident of [place] °-- Must have NO insurance coverage whatsoever (no Medicaid/ Medicare, etc.) °-- The pt. MUST have a primary care doctor that directs their care regularly and follows them in the community °  °[facility]  (866) 331-1348   °[employer]  (888) 892-1162   ° °Agencies that provide inexpensive medical care: °Organization         Address  Phone   Notes  °[facility]  (336) 832-8035   °[facility]    (336) 832-7272   °[facility] [address] °[place], [place] [address] (336) 832-4777   °[facility] [address], °[place] (336) 271-4999   °[facility]    (336) 373-0678   °[facility]    (336) 272-1050   °[facility] ° [address], [place] Phone:  (336) 832-4444, Fax:  (336) 832-4440 Hours of Operation:  9 am - 6 pm, M-F.  Also accepts Medicaid/Medicare and self-pay.  °[facility] ° [address], [place] Phone: (336) 832-3150, Fax: (336) 832-3151. Hours of Operation:  8:30 am - 5:30 pm, M-F.  Also accepts Medicaid and self-pay.  °[facility] [place] [address], [place] Phone: (336) 878-6027   °[facility] [address], [place], [place] (336)723-1848, Ext. 123 Mondays & Thursdays: 7-9 AM.  First 15 patients are seen on a first come, first serve basis. °  ° °Medicaid-accepting [place] Providers: ° °Organization         Address  Phone   Notes  °[facility] [address], [place] (336) 641-2100 Also accepts self-pay patients.  °[facility] [address], [place] ° (336) 856-9996   °[facility] [address], [place] (336) 288-8857   °[facility] [address] [place] [address], [place] (336) 299-7000   °Veita Bland [address], [place]  ° (336) 373-1557 Only accepts [place] Access Medicaid patients after they have their name applied to their card.  ° °Self-Pay (no insurance) in [place]: ° °Organization         Address  Phone   Notes  °Sickle Cell Patients, [facility] [address], [place] (336) 832-1970   °[facility] [address], [place] (336) 832-4400   °[facility] ° [address], [place] (336) 992-4800   °[facility]/Dr. Osei-Bonsu ° [address] [place] [address], [place] or [address], [place] (336) 841-8500 Phone number for both [place] and [place] locations is the same.  °[facility] [address], [place] (336) 299-0000   °[facility]  [address] [place] [address],  or [address] (336) 852-7530 °(336) 878-2260   °  [facility] [address], [place] (336) 350-1642, phone; (336) 294-5005, fax Sees patients 1st and 3rd Saturday of every month.  Must not qualify for public or private insurance (i.e. Medicaid, Medicare, [place] Health Choice, Veterans' Benefits) • Household income should be no more than 200% of the poverty level •The clinic cannot treat you if you are pregnant or think you are pregnant • Sexually transmitted diseases are not treated at the clinic.  ° ° °Dental Care: °Organization         Address  Phone  Notes  °[place] [facility] [address], [place] (336) 641-6152 Accepts children up to age 21 who are enrolled in Medicaid or [place] Health Choice; pregnant women with a Medicaid card; and children who have applied for Medicaid or [place] Health Choice, but were declined, whose parents can pay a reduced fee at time of service.  °[facility] [place]  [address], [place] (336) 641-7733 Accepts children up to age 21 who are  enrolled in Medicaid or [place] Health Choice; pregnant women with a Medicaid card; and children who have applied for Medicaid or [place] Health Choice, but were declined, whose parents can pay a reduced fee at time of service.  °[facility] ° [address], [place] (336) 641-4533 Patients are seen by appointment only. Walk-ins are not accepted. [facility] will see patients 18 years of age and older. °Monday - Tuesday (8am-5pm) °Most Wednesdays (8:30-5pm) °$30 per visit, cash only  °[facility] ° [address], [place] (336) 641-4533 Patients are seen by appointment only. Walk-ins are not accepted. [facility] will see patients 18 years of age and older. °One Wednesday Evening (Monthly: Volunteer Based).  $30 per visit, cash only  °[facility]  (919) 537-3737 for adults; Children under age 4, call Graduate Pediatric Dentistry at (919) [phone]. Children aged 4-14, please call (919) [phone] to request a pediatric application. ° Dental services are provided in all areas of dental care including fillings, crowns and bridges, complete and partial dentures, implants, gum treatment, root canals, and extractions. Preventive care is also provided. Treatment is provided to both adults and children. °Patients are selected via a lottery and there is often a waiting list. °  °[facility] [address], ° ° (336) 763-8833 [email or web address] °  °[facility] [address], [place], [place] (336)723-1848, Ext. 123 Second and Fourth Thursday of each month, opens at 6:30 AM; Clinic ends at 9 AM.  Patients are seen on a first-come first-served basis, and a limited number are seen during each clinic.  ° °[facility] ° [address], [place], [place] (336) 723-7904   Eligibility Requirements °You must have lived in [place], [place], or [place] counties for at least the last three months. °  You  cannot be eligible for state or federal sponsored healthcare insurance, including Veterans Administration, Medicaid, or Medicare. °  You generally cannot be eligible for healthcare insurance through your employer.  °  How to apply: °Eligibility screenings are held every Tuesday and Wednesday afternoon from 1:00 pm until 4:00 pm. You do not need an appointment for the interview!  °[facility] [address], [place],  336-631-2330   °[facility]  336-342-8273   °[facility]  336-703-3100   °[facility]  336-570-6415   ° °Behavioral Health Resources in the Community: °Intensive Outpatient Programs °Organization         Address  Phone  Notes  °[facility] [address], [place], [place] 336-878-6098   °[facility] [address], [place], [place] 336-832-9800   °[facility] [address], [place], [place] ° 336-882-2125   °[facility] [address],  °[place], [place] 1-800-853-5163 or 336-641-4981   °Substance Abuse Resources °Organization         Address  Phone  Notes  °[facility]  336-882-2125   °[facility]  336-784-9470   °[facility]  336-285-9073   °[facility]  336-845-3988   °Residential & Outpatient Substance Abuse Program  1-800-659-3381   °Psychological Services °Organization         Address  Phone  Notes  °[facility]  336- 832-9600   °[facility]  336- 378-7881   °[facility] [address], [place] 1-800-853-5163 or 336-641-4981   ° °Mobile Crisis Teams °Organization         Address  Phone  Notes  °[facility], Mobile Crisis Care Unit  1-877-626-1772   °Assertive °[facility] ° [address] [place], [place] 336-834-9664   °Sharon DeEsch [address] °[place] 336-554-5454   ° °Self-Help/Support  Groups °Organization         Address  Phone             Notes  °[facility] - variety of support groups  336- 373-1402 Call for more information  °Narcotics Anonymous (NA), [facility] [address], °[place]  2 meetings at this location  ° °Residential Treatment Programs °Organization         Address  Phone  Notes  °[facility] [address],    °[place]  1-866-801-8205   °[facility] ° [address], [place], [place] 704-293-8524   °[facility] [address], [place] 336-845-3988 Admissions: 8am-3pm M-F  °[facility] [address]    °[place], [place] 336-841-1104   °[facility] [address], [place], [place] 336-379-7146   °[facility] [address]  °[place], [place] 336-285-9073   °[facility] - Intensive Outpatient [address], [place], [place] 336-852-3033   °[facility] [address]  °[place], [place] 1-877-615-2722 or 336-784-9470   °[facility] [address] [place], [place] 336-227-7417 Accepts Medicaid  °[facility] [address]  °[place] 1-800-659-3381 Substance Abuse/Addiction Treatment  ° °[place] Behavioral Health Resources °Organization         Address  Phone  Notes  °[facility]  (888) 581-9988   °Julie Brannon, PhD [address] [place], [place]   (336) 349-5553 or (336) 951-0000   °[facility]   [address] °[place], [place] (336) 349-4454   °[facility] [address], [place], [place] (336) 342-8316 Insurance/Medicaid/sponsorship through [facility]  °[facility] [address]                                    [place],  (336) 342-8316 Therapy/tele-psych/case  °[facility]   [address]  ° [place], [place] (336) 349-2233    °Dr. Arfeen  (336) 349-4544   °[facility] [place]  [facility] [place] [facility] 1) [address], [place] °2) [address], [place] °3)  [address], [place] (336) 349-3220 °(336) 342-7768 ° °(336) 342-8140   °[place] [facility] (336) 342-1394 or (336) 342-3537 (After Hours)    ° ° ° °
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
## 2014-03-05 NOTE — ED Notes (Signed)
Per pt, states he had some dental issues over a week ago-saw dentist but wasn't given an antibiotic-still having dental pain and facial swelling-lower left tooth

## 2014-03-08 NOTE — ED Provider Notes (Signed)
Medical screening examination/treatment/procedure(s) were performed by non-physician practitioner and as supervising physician I was immediately available for consultation/collaboration.   EKG Interpretation None        Jamile Sivils, MD 03/08/14 1121 

## 2014-03-14 ENCOUNTER — Encounter (HOSPITAL_COMMUNITY): Payer: Self-pay

## 2014-03-14 NOTE — Progress Notes (Unsigned)
    Daily Group Progress Note  Program: CD-IOP   Group Time: 1-2:30 pm  Participation Level: {CHL AMB BH Group Participation:[id number]}  Behavioral Response: Sharing  Type of Therapy: Psycho-education Group  Topic: Triggers: the first part of group was spent in a psycho-ed on "Triggers". Members identified some of their triggers and a discussion followed on addressing these triggers. There was good disclosure and feedback among group members.  Group Time: 2:45- 4pm  Participation Level: Minimal  Behavioral Response: Resistant  Type of Therapy: Psycho-education Group  Topic: Refusal Skills: the second half of group was spent discussing handouts and refusal skills. Members read from the handouts and discussed the options available when confronted or feeling pressured to use. Members challenged some of the examples, but the ensuing discussion was able to put them into perspective.   Summary: Patient was present for group and participated. Patient reported that he has cravings about drinking alcohol, but he would like to do more "research" before taking a prescription to reduce cravings. It is unclear how he intends to conduct research. In part two of group, the patient participated minimally.  Patient  read from the handout, but offered little of himself and seemed bored and disinterested.    Family Program: Family present? No   Name of family member(s):   UDS collected: No Results:  AA/NA attended?: YesSaturday  Sponsor?: No   [facility]

## 2014-04-30 ENCOUNTER — Emergency Department (HOSPITAL_COMMUNITY): Payer: Self-pay

## 2014-04-30 ENCOUNTER — Emergency Department (HOSPITAL_COMMUNITY)
Admission: EM | Admit: 2014-04-30 | Discharge: 2014-05-01 | Disposition: A | Discharge status: Home / Self Care | Payer: Self-pay | Attending: Emergency Medicine | Admitting: Emergency Medicine

## 2014-04-30 ENCOUNTER — Encounter (HOSPITAL_COMMUNITY): Payer: Self-pay | Admitting: Emergency Medicine

## 2014-04-30 DIAGNOSIS — Z8719 Personal history of other diseases of the digestive system: Secondary | ICD-10-CM | POA: Insufficient documentation

## 2014-04-30 DIAGNOSIS — Z791 Long term (current) use of non-steroidal anti-inflammatories (NSAID): Secondary | ICD-10-CM | POA: Insufficient documentation

## 2014-04-30 DIAGNOSIS — N50812 Left testicular pain: Secondary | ICD-10-CM

## 2014-04-30 DIAGNOSIS — L729 Follicular cyst of the skin and subcutaneous tissue, unspecified: Secondary | ICD-10-CM | POA: Insufficient documentation

## 2014-04-30 DIAGNOSIS — F10120 Alcohol abuse with intoxication, uncomplicated: Secondary | ICD-10-CM | POA: Insufficient documentation

## 2014-04-30 DIAGNOSIS — I10 Essential (primary) hypertension: Secondary | ICD-10-CM | POA: Insufficient documentation

## 2014-04-30 DIAGNOSIS — R079 Chest pain, unspecified: Secondary | ICD-10-CM

## 2014-04-30 DIAGNOSIS — Z79899 Other long term (current) drug therapy: Secondary | ICD-10-CM | POA: Insufficient documentation

## 2014-04-30 DIAGNOSIS — F10920 Alcohol use, unspecified with intoxication, uncomplicated: Secondary | ICD-10-CM

## 2014-04-30 DIAGNOSIS — R0789 Other chest pain: Secondary | ICD-10-CM | POA: Insufficient documentation

## 2014-04-30 DIAGNOSIS — Z7982 Long term (current) use of aspirin: Secondary | ICD-10-CM | POA: Insufficient documentation

## 2014-04-30 DIAGNOSIS — G8929 Other chronic pain: Secondary | ICD-10-CM | POA: Insufficient documentation

## 2014-04-30 DIAGNOSIS — Z87891 Personal history of nicotine dependence: Secondary | ICD-10-CM | POA: Insufficient documentation

## 2014-04-30 DIAGNOSIS — Z8739 Personal history of other diseases of the musculoskeletal system and connective tissue: Secondary | ICD-10-CM | POA: Insufficient documentation

## 2014-04-30 LAB — BASIC METABOLIC PANEL
Anion gap: 24 — ABNORMAL HIGH (ref 5–15)
BUN: 11 mg/dL (ref 6–23)
CALCIUM: 9.3 mg/dL (ref 8.4–10.5)
CO2: 20 mEq/L (ref 19–32)
CREATININE: 0.75 mg/dL (ref 0.50–1.35)
Chloride: 96 mEq/L (ref 96–112)
Glucose, Bld: 98 mg/dL (ref 70–99)
Potassium: 4.4 mEq/L (ref 3.7–5.3)
Sodium: 140 mEq/L (ref 137–147)

## 2014-04-30 LAB — CBC
HCT: 46.5 % (ref 39.0–52.0)
Hemoglobin: 16.5 g/dL (ref 13.0–17.0)
MCH: 31.1 pg (ref 26.0–34.0)
MCHC: 35.5 g/dL (ref 30.0–36.0)
MCV: 87.7 fL (ref 78.0–100.0)
PLATELETS: 234 10*3/uL (ref 150–400)
RBC: 5.3 MIL/uL (ref 4.22–5.81)
RDW: 13.2 % (ref 11.5–15.5)
WBC: 9.5 10*3/uL (ref 4.0–10.5)

## 2014-04-30 LAB — HEPATIC FUNCTION PANEL
ALT: 15 U/L (ref 0–53)
AST: 32 U/L (ref 0–37)
Albumin: 4.3 g/dL (ref 3.5–5.2)
Alkaline Phosphatase: 66 U/L (ref 39–117)
BILIRUBIN TOTAL: 0.3 mg/dL (ref 0.3–1.2)
Total Protein: 8.1 g/dL (ref 6.0–8.3)

## 2014-04-30 LAB — I-STAT TROPONIN, ED: Troponin i, poc: 0.02 ng/mL (ref 0.00–0.08)

## 2014-04-30 LAB — ETHANOL: Alcohol, Ethyl (B): 314 mg/dL — ABNORMAL HIGH (ref 0–11)

## 2014-04-30 LAB — LIPASE, BLOOD: Lipase: 19 U/L (ref 11–59)

## 2014-04-30 MED ORDER — NITROGLYCERIN 0.4 MG SL SUBL
0.4000 mg | SUBLINGUAL_TABLET | SUBLINGUAL | Status: DC | PRN
  Administered 2014-04-30 (×2): 0.4 mg via SUBLINGUAL
  Filled 2014-04-30 (×2): qty 1

## 2014-04-30 MED ORDER — MORPHINE SULFATE 4 MG/ML IJ SOLN
4.0000 mg | Freq: Once | INTRAMUSCULAR | Status: AC
  Administered 2014-05-01: 4 mg via INTRAVENOUS
  Filled 2014-04-30: qty 1

## 2014-04-30 MED ORDER — ASPIRIN 325 MG PO TABS
325.0000 mg | ORAL_TABLET | Freq: Once | ORAL | Status: AC
  Administered 2014-04-30: 325 mg via ORAL
  Filled 2014-04-30: qty 1

## 2014-04-30 MED ORDER — ONDANSETRON HCL 4 MG/2ML IJ SOLN
4.0000 mg | Freq: Once | INTRAMUSCULAR | Status: AC
  Administered 2014-04-30: 4 mg via INTRAVENOUS
  Filled 2014-04-30: qty 2

## 2014-04-30 MED ORDER — THIAMINE HCL 100 MG/ML IJ SOLN
Freq: Once | INTRAVENOUS | Status: AC
  Administered 2014-04-30: 23:00:00 via INTRAVENOUS
  Filled 2014-04-30: qty 1000

## 2014-04-30 NOTE — ED Notes (Signed)
Bed: [id number] Expected date: 04/30/14 Expected time: 8:03 PM Means of arrival: Ambulance Comments: Etoh/chest pain
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
## 2014-04-30 NOTE — ED Notes (Signed)
Pt called EMS out for chest pain since 3 oclock today. States he drank 15+/- beers and some liquor today. Also c/o L testicle pain and a bump that has been growing there x 6 months. Intoxicated at this time. No acute distress. 12 lead unremarkable per EMS.

## 2014-05-01 ENCOUNTER — Emergency Department (HOSPITAL_COMMUNITY)
Admission: EM | Admit: 2014-05-01 | Discharge: 2014-05-01 | Disposition: A | Discharge status: Other Acute Inpatient Hospital | Payer: No Typology Code available for payment source | Attending: Emergency Medicine | Admitting: Emergency Medicine

## 2014-05-01 ENCOUNTER — Encounter (HOSPITAL_COMMUNITY): Payer: Self-pay | Admitting: *Deleted

## 2014-05-01 ENCOUNTER — Observation Stay (HOSPITAL_COMMUNITY)
Admission: AD | Admit: 2014-05-01 | Discharge: 2014-05-02 | Disposition: A | Discharge status: Home / Self Care | Payer: No Typology Code available for payment source | Source: Intra-hospital | Attending: Family | Admitting: Family

## 2014-05-01 DIAGNOSIS — K219 Gastro-esophageal reflux disease without esophagitis: Secondary | ICD-10-CM | POA: Insufficient documentation

## 2014-05-01 DIAGNOSIS — I10 Essential (primary) hypertension: Secondary | ICD-10-CM | POA: Insufficient documentation

## 2014-05-01 DIAGNOSIS — G8929 Other chronic pain: Secondary | ICD-10-CM | POA: Insufficient documentation

## 2014-05-01 DIAGNOSIS — Z7982 Long term (current) use of aspirin: Secondary | ICD-10-CM | POA: Insufficient documentation

## 2014-05-01 DIAGNOSIS — N08 Glomerular disorders in diseases classified elsewhere: Secondary | ICD-10-CM

## 2014-05-01 DIAGNOSIS — M549 Dorsalgia, unspecified: Secondary | ICD-10-CM | POA: Insufficient documentation

## 2014-05-01 DIAGNOSIS — Z87891 Personal history of nicotine dependence: Secondary | ICD-10-CM | POA: Insufficient documentation

## 2014-05-01 DIAGNOSIS — F10230 Alcohol dependence with withdrawal, uncomplicated: Secondary | ICD-10-CM

## 2014-05-01 DIAGNOSIS — F1010 Alcohol abuse, uncomplicated: Secondary | ICD-10-CM

## 2014-05-01 DIAGNOSIS — K709 Alcoholic liver disease, unspecified: Secondary | ICD-10-CM | POA: Insufficient documentation

## 2014-05-01 DIAGNOSIS — Z791 Long term (current) use of non-steroidal anti-inflammatories (NSAID): Secondary | ICD-10-CM | POA: Insufficient documentation

## 2014-05-01 DIAGNOSIS — F1994 Other psychoactive substance use, unspecified with psychoactive substance-induced mood disorder: Secondary | ICD-10-CM

## 2014-05-01 DIAGNOSIS — Z79899 Other long term (current) drug therapy: Secondary | ICD-10-CM | POA: Insufficient documentation

## 2014-05-01 DIAGNOSIS — F411 Generalized anxiety disorder: Secondary | ICD-10-CM | POA: Diagnosis present

## 2014-05-01 DIAGNOSIS — R0789 Other chest pain: Secondary | ICD-10-CM | POA: Insufficient documentation

## 2014-05-01 DIAGNOSIS — E854 Organ-limited amyloidosis: Secondary | ICD-10-CM | POA: Diagnosis present

## 2014-05-01 DIAGNOSIS — F329 Major depressive disorder, single episode, unspecified: Secondary | ICD-10-CM | POA: Insufficient documentation

## 2014-05-01 DIAGNOSIS — F10239 Alcohol dependence with withdrawal, unspecified: Secondary | ICD-10-CM | POA: Diagnosis present

## 2014-05-01 DIAGNOSIS — Z888 Allergy status to other drugs, medicaments and biological substances status: Secondary | ICD-10-CM | POA: Insufficient documentation

## 2014-05-01 DIAGNOSIS — K2970 Gastritis, unspecified, without bleeding: Secondary | ICD-10-CM | POA: Insufficient documentation

## 2014-05-01 DIAGNOSIS — R45851 Suicidal ideations: Secondary | ICD-10-CM

## 2014-05-01 HISTORY — DX: Major depressive disorder, single episode, unspecified: F32.9

## 2014-05-01 HISTORY — DX: Depression, unspecified: F32.A

## 2014-05-01 LAB — RAPID URINE DRUG SCREEN, HOSP PERFORMED
Amphetamines: NOT DETECTED
Barbiturates: NOT DETECTED
Benzodiazepines: NOT DETECTED
Cocaine: NOT DETECTED
Opiates: POSITIVE — AB
Tetrahydrocannabinol: NOT DETECTED

## 2014-05-01 LAB — I-STAT TROPONIN, ED: Troponin i, poc: 0.01 ng/mL (ref 0.00–0.08)

## 2014-05-01 LAB — ETHANOL: ALCOHOL ETHYL (B): 219 mg/dL — AB (ref 0–11)

## 2014-05-01 MED ORDER — ONDANSETRON HCL 4 MG PO TABS
4.0000 mg | ORAL_TABLET | Freq: Three times a day (TID) | ORAL | Status: DC | PRN
  Administered 2014-05-01: 4 mg via ORAL
  Filled 2014-05-01: qty 1

## 2014-05-01 MED ORDER — CHLORDIAZEPOXIDE HCL 25 MG PO CAPS: 25.0000 mg | ORAL_CAPSULE | ORAL | Status: DC

## 2014-05-01 MED ORDER — THIAMINE HCL 100 MG/ML IJ SOLN: 100.0000 mg | Freq: Every day | INTRAMUSCULAR | Status: DC

## 2014-05-01 MED ORDER — HYDROXYZINE HCL 25 MG PO TABS
25.0000 mg | ORAL_TABLET | Freq: Four times a day (QID) | ORAL | Status: DC | PRN
  Administered 2014-05-02 (×2): 25 mg via ORAL
  Filled 2014-05-01 (×2): qty 1

## 2014-05-01 MED ORDER — ALUM & MAG HYDROXIDE-SIMETH 200-200-20 MG/5ML PO SUSP: 30.0000 mL | ORAL | Status: DC | PRN

## 2014-05-01 MED ORDER — CHLORDIAZEPOXIDE HCL 25 MG PO CAPS
25.0000 mg | ORAL_CAPSULE | Freq: Four times a day (QID) | ORAL | Status: AC
  Administered 2014-05-01 – 2014-05-02 (×4): 25 mg via ORAL
  Filled 2014-05-01 (×4): qty 1

## 2014-05-01 MED ORDER — OMEPRAZOLE 20 MG PO CPDR: 20.0000 mg | DELAYED_RELEASE_CAPSULE | Freq: Every day | ORAL | Status: DC

## 2014-05-01 MED ORDER — NICOTINE 21 MG/24HR TD PT24
21.0000 mg | MEDICATED_PATCH | Freq: Every day | TRANSDERMAL | Status: DC
  Filled 2014-05-01 (×3): qty 1

## 2014-05-01 MED ORDER — LOPERAMIDE HCL 2 MG PO CAPS: 2.0000 mg | ORAL_CAPSULE | ORAL | Status: DC | PRN

## 2014-05-01 MED ORDER — IBUPROFEN 200 MG PO TABS
600.0000 mg | ORAL_TABLET | Freq: Three times a day (TID) | ORAL | Status: DC | PRN
  Administered 2014-05-01 (×2): 600 mg via ORAL
  Filled 2014-05-01 (×2): qty 3

## 2014-05-01 MED ORDER — LORAZEPAM 1 MG PO TABS
0.0000 mg | ORAL_TABLET | Freq: Four times a day (QID) | ORAL | Status: DC
  Administered 2014-05-01 (×2): 1 mg via ORAL
  Filled 2014-05-01 (×2): qty 1

## 2014-05-01 MED ORDER — IBUPROFEN 600 MG PO TABS
600.0000 mg | ORAL_TABLET | Freq: Three times a day (TID) | ORAL | Status: DC | PRN
  Filled 2014-05-01: qty 1

## 2014-05-01 MED ORDER — CHLORDIAZEPOXIDE HCL 25 MG PO CAPS: 25.0000 mg | ORAL_CAPSULE | Freq: Every day | ORAL | Status: DC

## 2014-05-01 MED ORDER — ZOLPIDEM TARTRATE 5 MG PO TABS: 5.0000 mg | ORAL_TABLET | Freq: Every evening | ORAL | Status: DC | PRN

## 2014-05-01 MED ORDER — MAGNESIUM HYDROXIDE 400 MG/5ML PO SUSP: 30.0000 mL | Freq: Every day | ORAL | Status: DC | PRN

## 2014-05-01 MED ORDER — VITAMIN B-1 100 MG PO TABS
100.0000 mg | ORAL_TABLET | Freq: Every day | ORAL | Status: DC
  Administered 2014-05-01: 100 mg via ORAL
  Filled 2014-05-01: qty 1

## 2014-05-01 MED ORDER — NICOTINE 21 MG/24HR TD PT24
21.0000 mg | MEDICATED_PATCH | Freq: Every day | TRANSDERMAL | Status: DC
  Filled 2014-05-01: qty 1

## 2014-05-01 MED ORDER — CHLORDIAZEPOXIDE HCL 25 MG PO CAPS
25.0000 mg | ORAL_CAPSULE | Freq: Three times a day (TID) | ORAL | Status: DC
  Administered 2014-05-02: 25 mg via ORAL
  Filled 2014-05-01: qty 1

## 2014-05-01 MED ORDER — CHLORDIAZEPOXIDE HCL 25 MG PO CAPS: 25.0000 mg | ORAL_CAPSULE | Freq: Four times a day (QID) | ORAL | Status: DC | PRN

## 2014-05-01 MED ORDER — ONDANSETRON 4 MG PO TBDP: 8.0000 mg | ORAL_TABLET | Freq: Once | ORAL | Status: DC

## 2014-05-01 MED ORDER — CHLORDIAZEPOXIDE HCL 25 MG PO CAPS
25.0000 mg | ORAL_CAPSULE | Freq: Four times a day (QID) | ORAL | Status: DC
  Administered 2014-05-01: 25 mg via ORAL
  Filled 2014-05-01: qty 1

## 2014-05-01 MED ORDER — CHLORDIAZEPOXIDE HCL 25 MG PO CAPS: 25.0000 mg | ORAL_CAPSULE | Freq: Three times a day (TID) | ORAL | Status: DC

## 2014-05-01 MED ORDER — VITAMIN B-1 100 MG PO TABS
100.0000 mg | ORAL_TABLET | Freq: Every day | ORAL | Status: DC
Start: 2014-05-02 — End: 2014-05-03
  Administered 2014-05-02: 100 mg via ORAL
  Filled 2014-05-01 (×3): qty 1

## 2014-05-01 MED ORDER — LORAZEPAM 1 MG PO TABS: 0.0000 mg | ORAL_TABLET | Freq: Two times a day (BID) | ORAL | Status: DC

## 2014-05-01 MED ORDER — HYDROXYZINE HCL 25 MG PO TABS: 25.0000 mg | ORAL_TABLET | Freq: Four times a day (QID) | ORAL | Status: DC | PRN

## 2014-05-01 MED ORDER — ONDANSETRON HCL 4 MG PO TABS: 4.0000 mg | ORAL_TABLET | Freq: Three times a day (TID) | ORAL | Status: DC | PRN

## 2014-05-01 MED ORDER — NAPROXEN 500 MG PO TABS: 500.0000 mg | ORAL_TABLET | Freq: Two times a day (BID) | ORAL | Status: DC | PRN

## 2014-05-01 NOTE — ED Notes (Signed)
Report given to eric s. rn

## 2014-05-01 NOTE — BH Assessment (Signed)
Reviewed ED notes prior to assessment. Pt was seen earlier in ED for testicle pain and discharge with OP resources, [facility] He then stated he was suicidal [facility] BAL was 314 at earlier visit. Current labs pending.   Requested cart be placed with pt for assessment.   Assessment to commence shortly.    Clista BernhardtNancy Laurel Harnden, [facility] Triage Specialist 05/01/2014 5:02 AM

## 2014-05-01 NOTE — ED Notes (Signed)
Pt was discharged from this facility early this morning; pt was upset about being discharged [facility] that he needs detox from ETOH and that he feels unsafe by himself due to his depression; pt states that he has thought about suicide but no specific plan
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
## 2014-05-01 NOTE — ED Notes (Addendum)
Pt escorted out by security for refusing to leave. Alert, oriented and ambulatory at time of departure.

## 2014-05-01 NOTE — Progress Notes (Signed)
D: Pt mood is depressed and affect is congruent. He denies SI/HI at this time.  A: Support given. Verbalization encouraged. Pt encouraged to come to nurse with any concerns. Medications given as prescribed.  R: Pt is receptive. No complaints of pain or discomfort at this time. Will continue to monitor pt.

## 2014-05-01 NOTE — BH Assessment (Signed)
Began assessment and pt had eyes closed and was some trouble answering questions and then began to vomit profusely.   Informed hospital staff of pt condition.    Clista BernhardtNancy Mylee Falin, [facility] T[facility] Specialist 05/01/2014 5:15 AM

## 2014-05-01 NOTE — Progress Notes (Signed)
Patient ID: Brent HeightJohn F Gay, male   DOB: 07/04/1965, 48 y.o.   MRN: [id number] Patient is a  48 y.o. male admitted to obs for alcohol detox and SI thoughts, no plan or intent to harm self. Pt states he drinks 1/5, daily, last drink was 04/30/14. He has 1/5 of liquor. Pt denies seizures/blackouts, no legal issues. Denies HI, and  AVH. Oriented to unit. Nutrition offered. Education provided regarding safety and falls. Safety checks started every 15 minutes.

## 2014-05-01 NOTE — Consult Note (Signed)
[facility]   Reason for Consult:  Alcohol detox Referring Physician:  EDP  Brent Gay is an 48 y.o. male. Total Time spent with patient: 45 minutes  Assessment: AXIS I:  Alcohol Abuse, Substance Abuse and Substance Induced Mood Disorder AXIS II:  Deferred AXIS III:   Past Medical History  Diagnosis Date  . Hypertension   . Chronic back pain   . Alcohol abuse   . GERD (gastroesophageal reflux disease)   . Gastritis   . Atypical chest pain   . Chronic pain   . Alcoholic liver disease   . Depression    AXIS IV:  other psychosocial or environmental problems and problems related to social environment AXIS V:  41-50 serious symptoms  Plan:  Observation  Subjective:   Brent Gay is a 48 y.o. male [facility]  HPI:  Patient states that he has a problem with alcohol and would like detox.  Sates his last inpatient detox was "July of this year and I relapsed a couple of weeks ago"  Patient denies suicidal ideation stating "I just feel the thoughts a little bit; I don't have a plan.  I just want to get straighten back up."  Patient denies homicidal ideation, psychosis, and paranoia.  Patient states that he drinks fifth liquor a day and last drink was yesterday.  Denies history of seizure or DT's.  HPI Elements:   Location:  Alcohol abuse. Quality:  alcohol detox. Severity:  alcohol withdrawal. Timing:  couple weeks. Review of Systems  Psychiatric/Behavioral: Positive for depression and substance abuse. Negative for suicidal ideas, hallucinations and memory loss. The patient is not nervous/anxious and does not have insomnia.   All other systems reviewed and are negative.  Family History  Problem Relation Age of Onset  . Diabetes Mellitus II Father   . Alcohol abuse Father     Past Psychiatric History: Past Medical History  Diagnosis Date  . Hypertension   . Chronic back pain   . Alcohol abuse   . GERD  (gastroesophageal reflux disease)   . Gastritis   . Atypical chest pain   . Chronic pain   . Alcoholic liver disease   . Depression     reports that he quit smoking about 22 months ago. His smoking use included Cigarettes. He has a .5 pack-year smoking history. He has quit using smokeless tobacco. He reports that he drinks about 18.0 oz of alcohol per week. He reports that he uses illicit drugs (Cocaine). Family History  Problem Relation Age of Onset  . Diabetes Mellitus II Father   . Alcohol abuse Father    Family History Substance Abuse: No Family Supports: No Living Arrangements: Alone Can pt return to current living arrangement?: Yes Abuse/Neglect [facility] Physical Abuse: Denies Verbal Abuse: Denies Sexual Abuse: Denies Allergies:   Allergies  Allergen Reactions  . Acetaminophen     " liver damage"  . Citalopram Swelling    Feet swelling    ACT Assessment Complete:  Yes:    Educational Status    Risk to Self: Risk to self with the past 6 months Suicidal Ideation: Yes-Currently Present Suicidal Intent: No Is patient at risk for suicide?: No Suicidal Plan?: No Access to Means: No What has been your use of drugs/alcohol within the last 12 months?: Abusing: alcohol  Previous Attempts/Gestures: No How many times?: 0 Other Self Harm Risks: None  Triggers for Past Attempts: None known Intentional Self Injurious  Behavior: None Family Suicide History: No Recent stressful life event(s): Other (Comment) (Chronic SA) Persecutory voices/beliefs?: No Depression: Yes Depression Symptoms: Loss of interest in usual pleasures Substance abuse history and/or treatment for substance abuse?: Yes Suicide prevention information given to non-admitted patients: Not applicable  Risk to Others: Risk to Others within the past 6 months Homicidal Ideation: No Thoughts of Harm to Others: No Current Homicidal Intent: No Current Homicidal Plan: No Access to Homicidal Means: No Identified  Victim: None  History of harm to others?: No Assessment of Violence: None Noted Violent Behavior Description: None  Does patient have access to weapons?: No Criminal Charges Pending?: No Does patient have a court date: No  Abuse: Abuse/Neglect Assessment (Assessment to be complete while patient is alone) Physical Abuse: Denies Verbal Abuse: Denies Sexual Abuse: Denies Exploitation of patient/patient's resources: Denies Self-Neglect: Denies  Prior Inpatient Therapy: Prior Inpatient Therapy Prior Inpatient Therapy: Yes Prior Therapy Dates: 2010,2015 [facility] Reason for Treatment: Detox   Prior Outpatient Therapy: Prior Outpatient Therapy Prior Outpatient Therapy: No Prior Therapy Dates: None  Prior Therapy Facilty/Provider(s): None  Reason for Treatment: None   Additional Information: Additional Information 1:1 In Past 12 Months?: No CIRT Risk: No Elopement Risk: No Does patient have medical clearance?: Yes                  Objective: Blood pressure 126/64, pulse 107, temperature 98.6 F (37 C), temperature source Oral, resp. rate 18, SpO2 98 %.There is no weight on file to calculate BMI. Results for orders placed or performed during the hospital encounter of 05/01/14 (from the past 72 hour(s))  Ethanol     Status: Abnormal   Collection Time: 05/01/14  4:32 AM  Result Value Ref Range   Alcohol, Ethyl (B) 219 (H) 0 - 11 mg/dL    Comment:        LOWEST DETECTABLE LIMIT FOR SERUM ALCOHOL IS 11 mg/dL FOR MEDICAL PURPOSES ONLY   Drug screen panel, emergency     Status: Abnormal   Collection Time: 05/01/14  9:59 AM  Result Value Ref Range   Opiates POSITIVE (A) NONE DETECTED   Cocaine NONE DETECTED NONE DETECTED   Benzodiazepines NONE DETECTED NONE DETECTED   Amphetamines NONE DETECTED NONE DETECTED   Tetrahydrocannabinol NONE DETECTED NONE DETECTED   Barbiturates NONE DETECTED NONE DETECTED    Comment:        DRUG SCREEN FOR  MEDICAL PURPOSES ONLY.  IF CONFIRMATION IS NEEDED FOR ANY PURPOSE, NOTIFY LAB WITHIN 5 DAYS.        LOWEST DETECTABLE LIMITS FOR URINE DRUG SCREEN Drug Class       Cutoff (ng/mL) Amphetamine      1000 Barbiturate      200 Benzodiazepine   200 Tricyclics       300 Opiates          300 Cocaine          300 THC              50    Labs are reviewed see abnormal values above.  Medications reviewed.  Librium protocol started for alcohol detox  Current Facility-Administered Medications  Medication Dose Route Frequency Provider Last Rate Last Dose  . alum & mag hydroxide-simeth (MAALOX/MYLANTA) 200-200-20 MG/5ML suspension 30 mL  30 mL Oral PRN Lyanne CoKevin M Campos, MD      . ibuprofen (ADVIL,MOTRIN) tablet 600 mg  600 mg Oral Q8H PRN Lyanne CoKevin M Campos, MD  600 mg at 05/01/14 1302  . LORazepam (ATIVAN) tablet 0-4 mg  0-4 mg Oral 4 times per day Lyanne Co, MD   1 mg at 05/01/14 1131   Followed by  . [START ON 05/03/2014] LORazepam (ATIVAN) tablet 0-4 mg  0-4 mg Oral Q12H Lyanne Co, MD      . nicotine (NICODERM CQ - dosed in mg/24 hours) patch 21 mg  21 mg Transdermal Daily Lyanne Co, MD   21 mg at 05/01/14 1110  . ondansetron (ZOFRAN) tablet 4 mg  4 mg Oral Q8H PRN Lyanne Co, MD   4 mg at 05/01/14 1324  . ondansetron (ZOFRAN-ODT) disintegrating tablet 8 mg  8 mg Oral Once Lyanne Co, MD   8 mg at 05/01/14 4010  . thiamine (VITAMIN B-1) tablet 100 mg  100 mg Oral Daily Lyanne Co, MD   100 mg at 05/01/14 1110   Or  . thiamine (B-1) injection 100 mg  100 mg Intravenous Daily Lyanne Co, MD      . zolpidem [facility]) tablet 5 mg  5 mg Oral QHS PRN Lyanne Co, MD       Current Outpatient Prescriptions  Medication Sig Dispense Refill  . aspirin 325 MG tablet Take 650 mg by mouth daily as needed for moderate pain (pain).    . Multiple Vitamin (MULTIVITAMIN WITH MINERALS) TABS tablet Take 1 tablet by mouth daily.    Marland Kitchen acamprosate (CAMPRAL) 333 MG tablet Take 2 tablets  (666 mg total) by mouth 3 (three) times daily with meals. (Patient not taking: Reported on 05/01/2014) 180 tablet 2  . busPIRone (BUSPAR) 15 MG tablet Take 1 tablet (15 mg total) by mouth 3 (three) times daily. For anxiety (Patient not taking: Reported on 05/01/2014) 90 tablet 0  . diclofenac (VOLTAREN) 50 MG EC tablet Take 1 tablet (50 mg total) by mouth 2 (two) times daily. (Patient not taking: Reported on 05/01/2014) 30 tablet 0  . hydrOXYzine (ATARAX/VISTARIL) 50 MG tablet Take 1 tablet (50 mg) three times daily as needed: for Anxiety/tension (Patient not taking: Reported on 05/01/2014) 30 tablet 0  . ibuprofen (ADVIL,MOTRIN) 200 MG tablet Take 1,000 mg by mouth every 6 (six) hours as needed for moderate pain (pain).    . naltrexone (DEPADE) 50 MG tablet Take 1 tablet (50 mg total) by mouth daily. For alcohol addiction (Patient not taking: Reported on 05/01/2014) 30 tablet 0  . naproxen (NAPROSYN) 500 MG tablet Take 1 tablet (500 mg total) by mouth 2 (two) times daily as needed. (Patient not taking: Reported on 05/01/2014) 8 tablet 0  . omeprazole (PRILOSEC) 20 MG capsule Take 1 capsule (20 mg total) by mouth daily. (Patient not taking: Reported on 05/01/2014) 30 capsule 0    Psychiatric Specialty Exam:     Blood pressure 126/64, pulse 107, temperature 98.6 F (37 C), temperature source Oral, resp. rate 18, SpO2 98 %.There is no weight on file to calculate BMI.  General Appearance: Casual  Eye Contact::  Good  Speech:  Clear and Coherent and Normal Rate  Volume:  Normal  Mood:  Depressed  Affect:  Congruent  Thought Process:  Circumstantial  Orientation:  Full (Time, Place, and Person)  Thought Content:  Rumination  Suicidal Thoughts:  No  Homicidal Thoughts:  No  Memory:  Immediate;   Good Recent;   Good Remote;   Good  Judgement:  Intact  Insight:  Fair  Psychomotor Activity:  Tremor  Concentration:  Fair  Recall:  [place] Major of Knowledge:Good  Language: Good  Akathisia:  No   Handed:  Right  AIMS (if indicated):     Assets:  Communication Skills Desire for Improvement Social Support  Sleep:      Musculoskeletal: Strength & Muscle Tone: within normal limits Gait & Station: normal Patient leans: N/A  Treatment Plan Summary: 24 hour observation and Librium protocol   Patient accepted to [facility] Observation Unit Bed 7  Rankin, Shuvon, FNP-BC 05/01/2014 1:14 PM  Patient seen, evaluated and I agree with notes by Nurse Practitioner. Thedore Mins, MD

## 2014-05-01 NOTE — Progress Notes (Signed)
[facility]. [id number]. RN received documents and will send over with pt.   Kateri PlummerKristin Zakarie Sturdivant, M.S., LPCA, [facility] Licensed Professional Counselor Associate  Triage Specialist  [facility]  Therapeutic Triage Services Phone: [phone] Fax: [phone]

## 2014-05-01 NOTE — Plan of Care (Signed)
[facility] Observation Crisis Plan  Reason for Crisis Plan:  Crisis Stabilization and Substance Abuse   Plan of Care:  Referral for Substance Abuse  Family Support:      Current Living Environment:  Living Arrangements: Alone  Insurance:   Hospital Account    Name Acct ID Class Status Primary Coverage   Brent Gay, Brent Gay [id number] BEHAVIORAL HEALTH OBSERVATION Open None        Guarantor Account (for Hospital Account [id number])    Name Relation to Pt Service Area Active? Acct Type   Brent Gay, [facility]   Address Phone       [address] [place], [place] [address] [phone](H)          Coverage Information (for Hospital Account [id number])    Not on file      Legal Guardian:     Primary Care Provider:  No PCP Per Patient  Current Outpatient Providers:  unknown  Psychiatrist:     Counselor/Therapist:     Compliant with Medications:  No  Additional Information:   Brent Gay, Brent Gay 11/18/20155:04 PM

## 2014-05-01 NOTE — Discharge Instructions (Signed)
°Emergency Department Resource Guide °1) Find a Doctor and Pay Out of Pocket °Although you won't have to find out who is covered by your insurance plan, it is a good idea to ask around and get recommendations. You will then need to call the office and see if the doctor you have chosen will accept you as a new patient and what types of options they offer for patients who are self-pay. Some doctors offer discounts or will set up payment plans for their patients who do not have insurance, but you will need to ask so you aren't surprised when you get to your appointment. ° °2) Contact Your Local Health Department °Not all health departments have doctors that can see patients for sick visits, but many do, so it is worth a call to see if yours does. If you don't know where your local health department is, you can check in your phone book. The CDC also has a tool to help you locate your state's health department, and many state websites also have listings of all of their local health departments. ° °3) Find a Walk-in Clinic °If your illness is not likely to be very severe or complicated, you may want to try a walk in clinic. These are popping up all over the country in pharmacies, drugstores, and shopping centers. They're usually staffed by nurse practitioners or physician assistants that have been trained to treat common illnesses and complaints. They're usually fairly quick and inexpensive. However, if you have serious medical issues or chronic medical problems, these are probably not your best option. ° °No Primary Care Doctor: °- Call [facility] at  832-8000 - they can help you locate a primary care doctor that  accepts your insurance, provides certain services, etc. °- Physician Referral Service- 1-800-533-3463 ° °Chronic Pain Problems: °Organization         Address  Phone   Notes  °[facility]  (336) 297-2271 Patients need to be referred by their primary care doctor.  ° °Medication  Assistance: °Organization         Address  Phone   Notes  °[place] [facility] [address] °[place], [place] [address] (336) 641-8030 --Must be a resident of [place] °-- Must have NO insurance coverage whatsoever (no Medicaid/ Medicare, etc.) °-- The pt. MUST have a primary care doctor that directs their care regularly and follows them in the community °  °[facility]  (866) 331-1348   °[employer]  (888) 892-1162   ° °Agencies that provide inexpensive medical care: °Organization         Address  Phone   Notes  °[facility]  (336) 832-8035   ° Internal Medicine    (336) 832-7272   °[facility] [address] °[place], [place] [address] (336) 832-4777   °[facility] [address], °[place] (336) 271-4999   °[facility]    (336) 373-0678   °[facility]    (336) 272-1050   °[facility] ° [address], [place] Phone:  (336) 832-4444, Fax:  (336) 832-4440 Hours of Operation:  9 am - 6 pm, M-F.  Also accepts Medicaid/Medicare and self-pay.  °[facility] ° [address], [place] Phone: (336) 832-3150, Fax: (336) 832-3151. Hours of Operation:  8:30 am - 5:30 pm, M-F.  Also accepts Medicaid and self-pay.  °[facility] [place] [address], [place] Phone: (336) 878-6027   °[facility] [address], [place], [place] (336)723-1848, Ext. 123 Mondays & Thursdays: 7-9 AM.  First 15 patients are seen on a first come, first serve basis. °  ° °Medicaid-accepting [place] Providers: ° °Organization         Address  Phone   Notes  °[facility] [address], [place] (336) 641-2100 Also accepts self-pay patients.  °[facility] [address], [place] ° (336) 856-9996   °[facility] [address], [place]  (336) 288-8857   °[facility] [address] [place] [address], [place] (336) 299-7000   °Veita Bland [address], [place]  ° (336) 373-1557 Only accepts [place] Access Medicaid patients after they have their name applied to their card.  ° °Self-Pay (no insurance) in [place]: ° °Organization         Address  Phone   Notes  °Sickle Cell Patients, [facility] [address], [place] (336) 832-1970   °[facility] [address], [place] (336) 832-4400   °[facility] ° [address], [place] (336) 992-4800   °[facility]/Dr. Osei-Bonsu ° [address] [place] [address], [place] or [address], [place] (336) 841-8500 Phone number for both [place] and [place] locations is the same.  °[facility] [address], [place] (336) 299-0000   °[facility] [address] [place] [address], [place] or [address] (336) 852-7530 °(336) 878-2260   °[facility] [address], [place] (336) 350-1642, phone; (336) 294-5005, fax Sees patients 1st and 3rd Saturday of every month.  Must not qualify for public or private insurance (i.e. Medicaid, Medicare, [place] Health Choice, Veterans' Benefits) • Household income should be no more than 200% of the poverty level •The clinic cannot treat you if you are pregnant or think you are pregnant • Sexually transmitted diseases are not treated at the clinic.  ° ° °Dental Care: °Organization         Address  Phone  Notes  °[place] [facility] [address], [place] (336) 641-6152 Accepts children up to age 21 who are enrolled in Medicaid or [place] Health Choice; pregnant women with a Medicaid card; and children who have applied for Medicaid or [place] Health Choice, but were declined, whose parents can pay a reduced fee at time of service.  °[place]  [facility] [place]  [address], [place] (336) 641-7733 Accepts children up to age 21 who are enrolled in Medicaid or [place] Health Choice; pregnant women with a Medicaid card; and children who have applied for Medicaid or  Health Choice, but were declined, whose parents can pay a reduced fee at time of service.  °[facility] ° [address],  (336) 641-4533 Patients are seen by appointment only. Walk-ins are not accepted. [facility] will see patients 18 years of age and older. °Monday - Tuesday (8am-5pm) °Most Wednesdays (8:30-5pm) °$30 per visit, cash only  °[facility] ° [address], [place] (336) 641-4533 Patients are seen by appointment only. Walk-ins are not accepted. [facility] will see patients 18 years of age and older. °One   Wednesday Evening (Monthly: Volunteer Based).  $30 per visit, cash only  °[facility]  (919) 537-3737 for adults; Children under age 4, call Graduate Pediatric Dentistry at (919) [phone]. Children aged 4-14, please call (919) [phone] to request a pediatric application. ° Dental services are provided in all areas of dental care including fillings, crowns and bridges, complete and partial dentures, implants, gum treatment, root canals, and extractions. Preventive care is also provided. Treatment is provided to both adults and children. °Patients are selected via a lottery and there is often a waiting list. °  °[facility] [address], °[place] ° (336) 763-8833 [email or web address] °  °[facility] [address], [place], [place] (336)723-1848, Ext. 123 Second and Fourth Thursday of each month, opens at 6:30 AM; Clinic ends at 9 AM.  Patients are seen on a first-come first-served basis, and a limited number are seen during each clinic.  ° °[facility] ° [address], [place], [place] (336) 723-7904    Eligibility Requirements °You must have lived in [place], [place], or [place] counties for at least the last three months. °  You cannot be eligible for state or federal sponsored healthcare insurance, including Veterans Administration, Medicaid, or Medicare. °  You generally cannot be eligible for healthcare insurance through your employer.  °  How to apply: °Eligibility screenings are held every Tuesday and Wednesday afternoon from 1:00 pm until 4:00 pm. You do not need an appointment for the interview!  °[facility] [address], [place], [place] 336-631-2330   °[facility] [facility]  336-342-8273   °[facility]  336-703-3100   °[facility]  336-570-6415   ° °Behavioral Health Resources in the Community: °Intensive Outpatient Programs °Organization         Address  Phone  Notes  °[facility] [address], [place], [place] 336-878-6098   °[facility] [address], [place], [place] 336-832-9800   °[facility] [address], [place], [place] ° 336-882-2125   °[facility] [address],  °[place], [place] 1-800-853-5163 or 336-641-4981   °Substance Abuse Resources °Organization         Address  Phone  Notes  °[facility]  336-882-2125   °[facility]  336-784-9470   °[facility]  336-285-9073   °[facility]  336-845-3988   °Residential & Outpatient Substance Abuse Program  1-800-659-3381   °Psychological Services °Organization         Address  Phone  Notes  °[facility]  336- 832-9600   °[facility]  336- 378-7881   °[facility] [address], [place] 1-800-853-5163 or 336-641-4981   ° °Mobile Crisis Teams °Organization         Address  Phone  Notes  °[facility], Mobile Crisis Care Unit  1-877-626-1772   °Assertive °[facility] ° [address]  [place], [place] 336-834-9664   °Sharon DeEsch [address] °[place] 336-554-5454   ° °Self-Help/Support Groups °Organization         Address  Phone             Notes  °[facility] - variety of support groups  336- 373-1402 Call for more information  °Narcotics Anonymous (NA), [facility] [address], °[place]   2 meetings at this location  ° °  Residential Treatment Programs °Organization         Address  Phone  Notes  °[facility] [address],    °[place]  1-866-801-8205   °[facility] ° [address], [place], [place] 704-293-8524   °[facility] [address], [place] 336-845-3988 Admissions: 8am-3pm M-F  °[facility] [address]    °[place], [place] 336-841-1104   °[facility] [address], [place], [place] 336-379-7146   °[facility] [address]  °[place], [place] 336-285-9073   °[facility] Intensive Outpatient [address], [place], [place] 336-852-3033   °[facility] [address]  °[place], [place] 1-877-615-2722 or 336-784-9470   °[facility] ([facility] [address] [place], [place] 336-227-7417 Accepts Medicaid  °[facility] [address]  °[place] 1-800-659-3381 Substance Abuse/Addiction Treatment  ° °[place] Behavioral Health Resources °Organization         Address  Phone  Notes  °[facility]  (888) 581-9988   °Julie Brannon, PhD [address] [place], [place]   (336) 349-5553 or (336) 951-0000   °[facility]   [address] °[place], [place] (336) 349-4454   °[facility] [address], [place], [place] (336) 342-8316 Insurance/Medicaid/sponsorship through [facility]  °[facility] [address]                                    [place], [place] (336) 342-8316 Therapy/tele-psych/case    °[facility] [address]  ° [place], [place] (336) 349-2233    °Dr. Arfeen  (336) 349-4544   °[facility] [place]  [employer] [place] [facility] 1) [address],  °2) [address], [place] °3)  [address], [place] (336) 349-3220 °(336) 342-7768 ° °(336) 342-8140   °[place] [facility] (336) 342-1394 or (336) 342-3537 (After Hours)    ° ° °

## 2014-05-01 NOTE — ED Provider Notes (Signed)
1:35 AM Clinically sober.  Suspect gastritis.  Home on Prilosec.  Discharge home in good condition.  Lyanne CoKevin M Kareema Keitt, MD 05/01/14 [phone]

## 2014-05-01 NOTE — Progress Notes (Signed)
[facility] INPATIENT:  Family/Significant Other Suicide Prevention Education  Suicide Prevention Education:  Patient Refusal for Family/Significant Other Suicide Prevention Education: The patient Brent Gay has refused to provide written consent for family/significant other to be provided Family/Significant Other Suicide Prevention Education during admission and/or prior to discharge.  Physician notified.  Loren RacerMaggio, Leydi Winstead J 05/01/2014, 5:07 PM

## 2014-05-01 NOTE — ED Notes (Signed)
Pt has in belonging bag:  Brown boots, dark blue jeans, gray hoodie sweater, [place] baseball hat, white socks, brown belt, and black and red book bag.

## 2014-05-01 NOTE — ED Notes (Signed)
Pt concerned about US results and follow up for detox. Dr. Patria Maneampos at bedside twice to talk to patient about both of these things. Pt given outpatient follow up for urology and intensive outpatient psychiatric/detox care.
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
## 2014-05-02 ENCOUNTER — Encounter (HOSPITAL_COMMUNITY): Payer: Self-pay | Admitting: Registered Nurse

## 2014-05-02 DIAGNOSIS — F1010 Alcohol abuse, uncomplicated: Secondary | ICD-10-CM

## 2014-05-02 DIAGNOSIS — F1994 Other psychoactive substance use, unspecified with psychoactive substance-induced mood disorder: Secondary | ICD-10-CM

## 2014-05-02 MED ORDER — ADULT MULTIVITAMIN W/MINERALS CH: 1.0000 | ORAL_TABLET | Freq: Every day | ORAL | Status: DC

## 2014-05-02 MED ORDER — OMEPRAZOLE 20 MG PO CPDR: 20.0000 mg | DELAYED_RELEASE_CAPSULE | Freq: Every day | ORAL | Status: DC

## 2014-05-02 MED ORDER — HYDROXYZINE HCL 25 MG PO TABS: 25.0000 mg | ORAL_TABLET | Freq: Four times a day (QID) | ORAL | Status: DC | PRN

## 2014-05-02 MED ORDER — IBUPROFEN 600 MG PO TABS
600.0000 mg | ORAL_TABLET | Freq: Three times a day (TID) | ORAL | Status: DC | PRN
Start: 2014-05-02 — End: 2014-05-03
  Administered 2014-05-02: 600 mg via ORAL

## 2014-05-02 NOTE — Progress Notes (Signed)
Pt alert, oriented and cooperative. Affect/mood anxious. Pt await discharge to rehab. C/o headache see MAR. -SI/HI, -A/[place]. Medication given as ordered. Will continue to monitor closely and evaluate for stabilization.

## 2014-05-02 NOTE — Discharge Instructions (Signed)
To help you maintain a sober lifestyle, a substance abuse treatment program may be beneficial to you.  [facility] after 10:00 pm tonight.  You will be transported directly there from the Observation Unit via the [facility] transport agency:       [facility]      [address]      [place], [place] [address]      [phone]  To help you maintain your sobriety after going through detox, a screening appointment has been scheduled [facility] on Monday 05/13/2014 at 8:00 am.  They will be considering you for admission to their residential rehabilitation program at that time.  While screening does not guarantee admission, you should be prepared to be admitted immediately after the screening.  If for any reason you decide not to take advantage of this option, please call them so that they can make the appointment available to another client:       [facility]      [address]      [place], [place] [address]      [phone]

## 2014-05-02 NOTE — Plan of Care (Signed)
[facility] Observation Crisis Plan  Reason for Crisis Plan:  Substance Abuse   Plan of Care:  Referral for Substance Abuse  Family Support:    Unknown; pt is evasive in reply to this question; family members will only provide emotional support, not material.;   Current Living Environment:  Living Arrangements: Alone; pt has been evicted from [facility] where he was living.  Insurance:  Self Pay Hospital Account    Name Acct ID Class Status Primary Coverage   Brent Gay, Brent Gay [id number] BEHAVIORAL HEALTH OBSERVATION Open None        Guarantor Account (for Hospital Account [id number])    Name Relation to Pt Service Area Active? Acct Type   Brent Gay, Brent Gay Self [facility] Yes Behavioral Health   Address Phone       [address] [place], [place] [id number] [phone])          Coverage Information (for Hospital Account [id number])    Not on file      Legal Guardian:   Self  Primary Care Provider:  No PCP Per Patient  Current Outpatient Providers:  None  Psychiatrist:   None  Counselor/Therapist:   None  Compliant with Medications:  No; pt has run out of all medications and cannot afford to refill prescriptions  Additional Information: After consulting with Claudette Headonrad Withrow, NP it has been determined that pt does not present a life threatening danger to himself or others, and that psychiatric hospitalization is not indicated for him at this time.  He would, however, benefit from admission to a substance abuse treatment facility or program.  Pt would prefer residential treatment, and Renata CapriceConrad is agreeable to this provided placement can be found in a timely fashion.  Pt signed Consent to Release Information to [facility], [facility] and [facility].  [facility] reports that they are currently at capacity.  At 14:29 I [facility] [place] [facility], who scheduled pt for a screening appointment on Monday, 05/13/2014 at 08:00.  However, referral had already been sent to [facility], and at 17:40 Andrey CampanileSandy  called to report that they have agreed to accept pt to their facility.  He cannot be transferred until after 22:00, and he will need a week's supply of any home medications that he needs to have.  After speaking with pt and Renata CapriceConrad it has been determined that since pt has been off all medications for some time, he will not need any over the course of the next week.  I called back to Dollar Bay[place]andy to notify her, and she reports that as long as the pt understands that they will not be providing home medications, they will still agree to accept him.  Please call [facility] at [phone] immediately before sending him.  His discharge instructions will contain contact information for [facility], as well as [facility] for the screening appointment scheduled for 05/13/2014.  Doylene Canninghomas Amarri Satterly, MA Triage Specialist Raphael GibneyHughes, Javyon Fontan Patrick 11/19/20155:50 PM

## 2014-05-02 NOTE — Progress Notes (Signed)
Pt resting in bed with eyes closed. No s/s of distress noted. Will continue to monitor closely and evaluate for stabilization.

## 2014-05-02 NOTE — Progress Notes (Signed)
T/C to StraffordSandy, RN  @[facility]  confirms acceptance to rehab.

## 2014-05-02 NOTE — Discharge Summary (Signed)
[facility]  Brent Gay is an 48 y.o. male. Total Time spent with patient: 45 minutes  Assessment: AXIS I:  Alcohol Abuse, Substance Abuse and Substance Induced Mood Disorder AXIS II:  Deferred AXIS III:   Past Medical History  Diagnosis Date  . Hypertension   . Chronic back pain   . Alcohol abuse   . GERD (gastroesophageal reflux disease)   . Gastritis   . Atypical chest pain   . Chronic pain   . Alcoholic liver disease   . Depression    AXIS IV:  other psychosocial or environmental problems and problems related to social environment AXIS V:  51-60 moderate symptoms   Subjective:   Brent Gay is a 48 y.o. male [facility]. Pt currently denies any withdrawal symptoms and his CIWA scores have been consistently below 5. Pt denies SI, HI, and AVH and contracts for safety. Pt does express an interest in inpatient residential rehab and [name] will work to assist him with securing such followup treatment options. However, pt is aware [facility] a sub-24 hour treatment option and that we must discharge him to followup on his own if we do not secure placement by approximately 4:00PM today.   HPI:  Patient states that he has a problem with alcohol and would like detox.  Sates his last inpatient detox was "July of this year and I relapsed a couple of weeks ago"  Patient denies suicidal ideation stating "I just feel the thoughts a little bit; I don't have a plan.  I just want to get straighten back up."  Patient denies homicidal ideation, psychosis, and paranoia.  Patient states that he drinks fifth liquor a day and last drink was yesterday.  Denies history of seizure or DT's.  HPI Elements:   Location:  Alcohol abuse. Quality:  Improving. Severity:  Moderate. Timing:  Constant. Review of Systems  Constitutional: Negative.   HENT: Negative.   Eyes: Negative.    Respiratory: Negative.   Cardiovascular: Negative.   Gastrointestinal: Negative.   Genitourinary: Negative.   Musculoskeletal: Negative.   Skin: Negative.   Neurological: Negative.   Endo/Heme/Allergies: Negative.   Psychiatric/Behavioral: Positive for depression and substance abuse. Negative for suicidal ideas, hallucinations and memory loss. The patient is not nervous/anxious and does not have insomnia.   All other systems reviewed and are negative.  Family History  Problem Relation Age of Onset  . Diabetes Mellitus II Father   . Alcohol abuse Father     Past Psychiatric History: Past Medical History  Diagnosis Date  . Hypertension   . Chronic back pain   . Alcohol abuse   . GERD (gastroesophageal reflux disease)   . Gastritis   . Atypical chest pain   . Chronic pain   . Alcoholic liver disease   . Depression     reports that he quit smoking about 22 months ago. His smoking use included Cigarettes. He has a .5 pack-year smoking history. He has quit using smokeless tobacco. He reports that he drinks about 18.0 oz of alcohol per week. He reports that he uses illicit drugs (Cocaine). Family History  Problem Relation Age of Onset  . Diabetes Mellitus II Father   . Alcohol abuse Father      Living Arrangements: Alone   Abuse/Neglect [facility]) Physical Abuse: Denies Verbal Abuse: Denies Sexual Abuse: Denies Allergies:  Allergies  Allergen Reactions  . Acetaminophen     " liver damage"  . Citalopram Swelling    Feet swelling    ACT Assessment Complete:  Yes:    Educational Status    Risk to Self: Risk to self with the past 6 months Is patient at risk for suicide?: No  Risk to Others:    Abuse: Abuse/Neglect Assessment (Assessment to be complete while patient is alone) Physical Abuse: Denies Verbal Abuse: Denies Sexual Abuse: Denies Exploitation of patient/patient's resources: Denies Self-Neglect: Denies  Prior Inpatient Therapy:    Prior Outpatient Therapy:     Additional Information:                    Objective: Blood pressure 135/86, pulse 88, temperature 98.3 F (36.8 C), temperature source Oral, resp. rate 18, height 5' 7.5" (1.715 m), weight 92.534 kg (204 lb), SpO2 96 %.Body mass index is 31.46 kg/(m^2). Results for orders placed or performed during the hospital encounter of 05/01/14 (from the past 72 hour(s))  Ethanol     Status: Abnormal   Collection Time: 05/01/14  4:32 AM  Result Value Ref Range   Alcohol, Ethyl (B) 219 (H) 0 - 11 mg/dL    Comment:        LOWEST DETECTABLE LIMIT FOR SERUM ALCOHOL IS 11 mg/dL FOR MEDICAL PURPOSES ONLY   Drug screen panel, emergency     Status: Abnormal   Collection Time: 05/01/14  9:59 AM  Result Value Ref Range   Opiates POSITIVE (A) NONE DETECTED   Cocaine NONE DETECTED NONE DETECTED   Benzodiazepines NONE DETECTED NONE DETECTED   Amphetamines NONE DETECTED NONE DETECTED   Tetrahydrocannabinol NONE DETECTED NONE DETECTED   Barbiturates NONE DETECTED NONE DETECTED    Comment:        DRUG SCREEN FOR MEDICAL PURPOSES ONLY.  IF CONFIRMATION IS NEEDED FOR ANY PURPOSE, NOTIFY LAB WITHIN 5 DAYS.        LOWEST DETECTABLE LIMITS FOR URINE DRUG SCREEN Drug Class       Cutoff (ng/mL) Amphetamine      1000 Barbiturate      200 Benzodiazepine   200 Tricyclics       300 Opiates          300 Cocaine          300 THC              50    Labs are reviewed see abnormal values above.  Medications reviewed.  Librium protocol started for alcohol detox  Current Facility-Administered Medications  Medication Dose Route Frequency Provider Last Rate Last Dose  . alum & mag hydroxide-simeth (MAALOX/MYLANTA) 200-200-20 MG/5ML suspension 30 mL  30 mL Oral PRN Shuvon Rankin, NP      . chlordiazePOXIDE (LIBRIUM) capsule 25 mg  25 mg Oral Q6H PRN Shuvon Rankin, NP      . chlordiazePOXIDE (LIBRIUM) capsule 25 mg  25 mg Oral QID Shuvon Rankin, NP   25 mg at 05/02/14 16100826   Followed by  .  chlordiazePOXIDE (LIBRIUM) capsule 25 mg  25 mg Oral TID Shuvon Rankin, NP       Followed by  . [START ON 05/03/2014] chlordiazePOXIDE (LIBRIUM) capsule 25 mg  25 mg Oral BH-qamhs Shuvon Rankin, NP       Followed by  . [START ON 05/05/2014] chlordiazePOXIDE (LIBRIUM) capsule 25 mg  25 mg Oral Daily Shuvon Rankin, NP      . hydrOXYzine (ATARAX/VISTARIL) tablet 25  mg  25 mg Oral Q6H PRN Shuvon Rankin, NP   25 mg at 05/02/14 0512  . ibuprofen (ADVIL,MOTRIN) tablet 600 mg  600 mg Oral Q8H PRN Shuvon Rankin, NP      . loperamide (IMODIUM) capsule 2-4 mg  2-4 mg Oral PRN Shuvon Rankin, NP      . magnesium hydroxide (MILK OF MAGNESIA) suspension 30 mL  30 mL Oral Daily PRN Shuvon Rankin, NP      . nicotine (NICODERM CQ - dosed in mg/24 hours) patch 21 mg  21 mg Transdermal Daily Shuvon Rankin, NP   21 mg at 05/02/14 0824  . ondansetron (ZOFRAN) tablet 4 mg  4 mg Oral Q8H PRN Shuvon Rankin, NP      . thiamine (VITAMIN B-1) tablet 100 mg  100 mg Oral Daily Shuvon Rankin, NP   100 mg at 05/02/14 3016   Or  . thiamine (B-1) injection 100 mg  100 mg Intravenous Daily Shuvon Rankin, NP        Psychiatric Specialty Exam:     Blood pressure 135/86, pulse 88, temperature 98.3 F (36.8 C), temperature source Oral, resp. rate 18, height 5' 7.5" (1.715 m), weight 92.534 kg (204 lb), SpO2 96 %.Body mass index is 31.46 kg/(m^2).  General Appearance: Casual  Eye Contact::  Good  Speech:  Clear and Coherent and Normal Rate  Volume:  Normal  Mood:  Depressed  Affect:  Congruent  Thought Process:  Circumstantial  Orientation:  Full (Time, Place, and Person)  Thought Content:  Rumination about alcohol abuse  Suicidal Thoughts:  No  Homicidal Thoughts:  No  Memory:  Immediate;   Good Recent;   Good Remote;   Good  Judgement:  Intact  Insight:  Fair  Psychomotor Activity:  Tremor mild  Concentration:  Fair  Recall:  Good  Fund of Knowledge:Good  Language: Good  Akathisia:  No  Handed:  Right  AIMS  (if indicated):     Assets:  Communication Skills Desire for Improvement Social Support  Sleep:      Musculoskeletal: Strength & Muscle Tone: within normal limits Gait & Station: normal Patient leans: N/A  Treatment Plan Summary:  Discharge home with outpatient resources for followup if inpatient rehab (residential) not secured by approximately 4:00PM. If placement is secured, pt will discharge to the appropriate facility.   *See TTS Note regarding placement status vs. Outpatient referral. Regardless, pt will be discharged from OBS Unit at or before 4:30PM   Cervando, Durnin, FNP-BC 05/02/2014 12:00 PM

## 2014-05-02 NOTE — H&P (Signed)
[facility] OBS UNIT H&P  Brent Gay is an 48 y.o. male. Total Time spent with patient: 45 minutes  Assessment: AXIS I:  Alcohol Abuse, Substance Abuse and Substance Induced Mood Disorder AXIS II:  Deferred AXIS III:   Past Medical History  Diagnosis Date  . Hypertension   . Chronic back pain   . Alcohol abuse   . GERD (gastroesophageal reflux disease)   . Gastritis   . Atypical chest pain   . Chronic pain   . Alcoholic liver disease   . Depression    AXIS IV:  other psychosocial or environmental problems and problems related to social environment AXIS V:  51-60 moderate symptoms  Plan:  Discharge home with outpatient resources for followup if inpatient rehab (residential) not secured by approximately 4:00PM. If placement is secured, pt will discharge to the appropriate facility.   Subjective:   Brent Gay is a 48 y.o. male patient presents to [facility] requesting alcohol detox.Pt spent the night in the [facility] OBS Unit without incident. Pt currently denies any withdrawal symptoms and his CIWA scores have been consistently below 5. Pt denies SI, HI, and AVH and contracts for safety. Pt does express an interest in inpatient residential rehab and [name] with [facility]TS will work to assist him with securing such followup treatment options. However, pt is aware that [facility] OBS is a sub-24 hour treatment option and that we must discharge him to followup on his own if we do not secure placement by approximately 4:00PM today.   HPI:  Patient states that he has a problem with alcohol and would like detox.  Sates his last inpatient detox was "July of this year and I relapsed a couple of weeks ago"  Patient denies suicidal ideation stating "I just feel the thoughts a little bit; I don't have a plan.  I just want to get straighten back up."  Patient denies homicidal ideation, psychosis, and paranoia.  Patient states that he drinks fifth liquor a day and last drink was yesterday.  Denies history of seizure or  DT's.  HPI Elements:   Location:  Alcohol abuse. Quality:  Improving. Severity:  Moderate. Timing:  Constant. Review of Systems  Constitutional: Negative.   HENT: Negative.   Eyes: Negative.   Respiratory: Negative.   Cardiovascular: Negative.   Gastrointestinal: Negative.   Genitourinary: Negative.   Musculoskeletal: Negative.   Skin: Negative.   Neurological: Negative.   Endo/Heme/Allergies: Negative.   Psychiatric/Behavioral: Positive for depression and substance abuse. Negative for suicidal ideas, hallucinations and memory loss. The patient is not nervous/anxious and does not have insomnia.   All other systems reviewed and are negative.  Family History  Problem Relation Age of Onset  . Diabetes Mellitus II Father   . Alcohol abuse Father     Past Psychiatric History: Past Medical History  Diagnosis Date  . Hypertension   . Chronic back pain   . Alcohol abuse   . GERD (gastroesophageal reflux disease)   . Gastritis   . Atypical chest pain   . Chronic pain   . Alcoholic liver disease   . Depression     reports that he quit smoking about 22 months ago. His smoking use included Cigarettes. He has a .5 pack-year smoking history. He has quit using smokeless tobacco. He reports that he drinks about 18.0 oz of alcohol per week. He reports that he uses illicit drugs (Cocaine). Family History  Problem Relation Age of Onset  . Diabetes Mellitus II  Father   . Alcohol abuse Father      Living Arrangements: Alone   Abuse/Neglect [facility]) Physical Abuse: Denies Verbal Abuse: Denies Sexual Abuse: Denies Allergies:   Allergies  Allergen Reactions  . Acetaminophen     " liver damage"  . Citalopram Swelling    Feet swelling    ACT Assessment Complete:  Yes:    Educational Status    Risk to Self: Risk to self with the past 6 months Is patient at risk for suicide?: No  Risk to Others:    Abuse: Abuse/Neglect Assessment (Assessment to be complete while patient is  alone) Physical Abuse: Denies Verbal Abuse: Denies Sexual Abuse: Denies Exploitation of patient/patient's resources: Denies Self-Neglect: Denies  Prior Inpatient Therapy:    Prior Outpatient Therapy:    Additional Information:                    Objective: Blood pressure 135/86, pulse 88, temperature 98.3 F (36.8 C), temperature source Oral, resp. rate 18, height 5' 7.5" (1.715 m), weight 92.534 kg (204 lb), SpO2 96 %.Body mass index is 31.46 kg/(m^2). Results for orders placed or performed during the hospital encounter of 05/01/14 (from the past 72 hour(s))  Ethanol     Status: Abnormal   Collection Time: 05/01/14  4:32 AM  Result Value Ref Range   Alcohol, Ethyl (B) 219 (H) 0 - 11 mg/dL    Comment:        LOWEST DETECTABLE LIMIT FOR SERUM ALCOHOL IS 11 mg/dL FOR MEDICAL PURPOSES ONLY   Drug screen panel, emergency     Status: Abnormal   Collection Time: 05/01/14  9:59 AM  Result Value Ref Range   Opiates POSITIVE (A) NONE DETECTED   Cocaine NONE DETECTED NONE DETECTED   Benzodiazepines NONE DETECTED NONE DETECTED   Amphetamines NONE DETECTED NONE DETECTED   Tetrahydrocannabinol NONE DETECTED NONE DETECTED   Barbiturates NONE DETECTED NONE DETECTED    Comment:        DRUG SCREEN FOR MEDICAL PURPOSES ONLY.  IF CONFIRMATION IS NEEDED FOR ANY PURPOSE, NOTIFY LAB WITHIN 5 DAYS.        LOWEST DETECTABLE LIMITS FOR URINE DRUG SCREEN Drug Class       Cutoff (ng/mL) Amphetamine      1000 Barbiturate      200 Benzodiazepine   200 Tricyclics       300 Opiates          300 Cocaine          300 THC              50    Labs are reviewed see abnormal values above.  Medications reviewed.  Librium protocol started for alcohol detox  Current Facility-Administered Medications  Medication Dose Route Frequency Provider Last Rate Last Dose  . alum & mag hydroxide-simeth (MAALOX/MYLANTA) 200-200-20 MG/5ML suspension 30 mL  30 mL Oral PRN Shuvon Rankin, NP      .  chlordiazePOXIDE (LIBRIUM) capsule 25 mg  25 mg Oral Q6H PRN Shuvon Rankin, NP      . chlordiazePOXIDE (LIBRIUM) capsule 25 mg  25 mg Oral QID Shuvon Rankin, NP   25 mg at 05/02/14 16100826   Followed by  . chlordiazePOXIDE (LIBRIUM) capsule 25 mg  25 mg Oral TID Shuvon Rankin, NP       Followed by  . [START ON 05/03/2014] chlordiazePOXIDE (LIBRIUM) capsule 25 mg  25 mg Oral BH-qamhs Shuvon Rankin, NP  Followed by  . [START ON 05/05/2014] chlordiazePOXIDE (LIBRIUM) capsule 25 mg  25 mg Oral Daily Shuvon Rankin, NP      . hydrOXYzine (ATARAX/VISTARIL) tablet 25 mg  25 mg Oral Q6H PRN Shuvon Rankin, NP   25 mg at 05/02/14 0512  . ibuprofen (ADVIL,MOTRIN) tablet 600 mg  600 mg Oral Q8H PRN Shuvon Rankin, NP      . loperamide (IMODIUM) capsule 2-4 mg  2-4 mg Oral PRN Shuvon Rankin, NP      . magnesium hydroxide (MILK OF MAGNESIA) suspension 30 mL  30 mL Oral Daily PRN Shuvon Rankin, NP      . nicotine (NICODERM CQ - dosed in mg/24 hours) patch 21 mg  21 mg Transdermal Daily Shuvon Rankin, NP   21 mg at 05/02/14 0824  . ondansetron (ZOFRAN) tablet 4 mg  4 mg Oral Q8H PRN Shuvon Rankin, NP      . thiamine (VITAMIN B-1) tablet 100 mg  100 mg Oral Daily Shuvon Rankin, NP   100 mg at 05/02/14 16100824   Or  . thiamine (B-1) injection 100 mg  100 mg Intravenous Daily Shuvon Rankin, NP        Psychiatric Specialty Exam:     Blood pressure 135/86, pulse 88, temperature 98.3 F (36.8 C), temperature source Oral, resp. rate 18, height 5' 7.5" (1.715 m), weight 92.534 kg (204 lb), SpO2 96 %.Body mass index is 31.46 kg/(m^2).  General Appearance: Casual  Eye Contact::  Good  Speech:  Clear and Coherent and Normal Rate  Volume:  Normal  Mood:  Depressed  Affect:  Congruent  Thought Process:  Circumstantial  Orientation:  Full (Time, Place, and Person)  Thought Content:  Rumination about alcohol abuse  Suicidal Thoughts:  No  Homicidal Thoughts:  No  Memory:  Immediate;   Good Recent;   Good Remote;    Good  Judgement:  Intact  Insight:  Fair  Psychomotor Activity:  Tremor mild  Concentration:  Fair  Recall:  Good  Fund of Knowledge:Good  Language: Good  Akathisia:  No  Handed:  Right  AIMS (if indicated):     Assets:  Communication Skills Desire for Improvement Social Support  Sleep:      Musculoskeletal: Strength & Muscle Tone: within normal limits Gait & Station: normal Patient leans: N/A  Treatment Plan Summary:  Discharge home with outpatient resources for followup if inpatient rehab (residential) not secured by approximately 4:00PM. If placement is secured, pt will discharge to the appropriate facility.    Beau FannyWithrow, Welden C, FNP-BC 05/02/2014 10:54 AM

## 2014-05-02 NOTE — Progress Notes (Signed)
D:  Patient has slept off and on today.  Tremors and sweats have steadily decreased throughout the day.  He continues on Librium taper.  Taking fluids well.  Appetite is good.  After speaking with [facility] he has decided to go to rehab.  He is currently awaiting word from [name] in [facility] about bed availability.   A:  Encouraged fluids.  Spoke with patient about options and encouraged him to think about what he wants to do, then to let Elijah Birkom know when he comes in.  Medications given as ordered.   R:  Cooperative with staff.  Interacting well with peers.  Tolerating ETOH detox.

## 2014-05-03 NOTE — Progress Notes (Signed)
Pt discharged to [facility] via [facility] transportation. Pt denies pain or discomfort. VSS. -SI/HI, -A/[place]. Discharge instructions reviewed and prescriptions given.

## 2014-06-06 ENCOUNTER — Inpatient Hospital Stay (HOSPITAL_COMMUNITY)
Admission: EM | Admit: 2014-06-06 | Discharge: 2014-06-09 | DRG: 313 | Disposition: A | Discharge status: Home / Self Care | Payer: Self-pay | Attending: Cardiology | Admitting: Cardiology

## 2014-06-06 DIAGNOSIS — F10129 Alcohol abuse with intoxication, unspecified: Secondary | ICD-10-CM | POA: Diagnosis present

## 2014-06-06 DIAGNOSIS — K219 Gastro-esophageal reflux disease without esophagitis: Secondary | ICD-10-CM | POA: Diagnosis present

## 2014-06-06 DIAGNOSIS — R61 Generalized hyperhidrosis: Secondary | ICD-10-CM | POA: Diagnosis present

## 2014-06-06 DIAGNOSIS — Z833 Family history of diabetes mellitus: Secondary | ICD-10-CM

## 2014-06-06 DIAGNOSIS — F329 Major depressive disorder, single episode, unspecified: Secondary | ICD-10-CM | POA: Diagnosis present

## 2014-06-06 DIAGNOSIS — Z79899 Other long term (current) drug therapy: Secondary | ICD-10-CM

## 2014-06-06 DIAGNOSIS — F10929 Alcohol use, unspecified with intoxication, unspecified: Secondary | ICD-10-CM

## 2014-06-06 DIAGNOSIS — G8929 Other chronic pain: Secondary | ICD-10-CM | POA: Diagnosis present

## 2014-06-06 DIAGNOSIS — M549 Dorsalgia, unspecified: Secondary | ICD-10-CM | POA: Diagnosis present

## 2014-06-06 DIAGNOSIS — R197 Diarrhea, unspecified: Secondary | ICD-10-CM | POA: Diagnosis not present

## 2014-06-06 DIAGNOSIS — Z888 Allergy status to other drugs, medicaments and biological substances status: Secondary | ICD-10-CM

## 2014-06-06 DIAGNOSIS — E876 Hypokalemia: Secondary | ICD-10-CM

## 2014-06-06 DIAGNOSIS — K709 Alcoholic liver disease, unspecified: Secondary | ICD-10-CM | POA: Diagnosis present

## 2014-06-06 DIAGNOSIS — F17210 Nicotine dependence, cigarettes, uncomplicated: Secondary | ICD-10-CM | POA: Diagnosis present

## 2014-06-06 DIAGNOSIS — R072 Precordial pain: Principal | ICD-10-CM | POA: Diagnosis present

## 2014-06-06 DIAGNOSIS — R079 Chest pain, unspecified: Secondary | ICD-10-CM | POA: Diagnosis present

## 2014-06-06 DIAGNOSIS — I10 Essential (primary) hypertension: Secondary | ICD-10-CM | POA: Diagnosis present

## 2014-06-06 DIAGNOSIS — R0602 Shortness of breath: Secondary | ICD-10-CM | POA: Diagnosis present

## 2014-06-06 DIAGNOSIS — R9431 Abnormal electrocardiogram [ECG] [EKG]: Secondary | ICD-10-CM

## 2014-06-06 DIAGNOSIS — Z23 Encounter for immunization: Secondary | ICD-10-CM

## 2014-06-07 ENCOUNTER — Encounter (HOSPITAL_COMMUNITY): Payer: Self-pay | Admitting: Emergency Medicine

## 2014-06-07 ENCOUNTER — Emergency Department (HOSPITAL_COMMUNITY): Payer: Self-pay

## 2014-06-07 LAB — COMPREHENSIVE METABOLIC PANEL
ALT: 16 U/L (ref 0–53)
ANION GAP: 9 (ref 5–15)
AST: 21 U/L (ref 0–37)
Albumin: 3.6 g/dL (ref 3.5–5.2)
Alkaline Phosphatase: 63 U/L (ref 39–117)
BUN: 7 mg/dL (ref 6–23)
CALCIUM: 7.8 mg/dL — AB (ref 8.4–10.5)
CO2: 23 mmol/L (ref 19–32)
CREATININE: 0.67 mg/dL (ref 0.50–1.35)
Chloride: 107 mEq/L (ref 96–112)
Glucose, Bld: 100 mg/dL — ABNORMAL HIGH (ref 70–99)
Potassium: 3.9 mmol/L (ref 3.5–5.1)
SODIUM: 139 mmol/L (ref 135–145)
TOTAL PROTEIN: 6.2 g/dL (ref 6.0–8.3)
Total Bilirubin: 0.6 mg/dL (ref 0.3–1.2)

## 2014-06-07 LAB — CBC WITH DIFFERENTIAL/PLATELET
Basophils Absolute: 0 10*3/uL (ref 0.0–0.1)
Basophils Absolute: 0.1 10*3/uL (ref 0.0–0.1)
Basophils Relative: 0 % (ref 0–1)
Basophils Relative: 1 % (ref 0–1)
Eosinophils Absolute: 0.3 10*3/uL (ref 0.0–0.7)
Eosinophils Absolute: 0.3 10*3/uL (ref 0.0–0.7)
Eosinophils Relative: 3 % (ref 0–5)
Eosinophils Relative: 5 % (ref 0–5)
HCT: 44.3 % (ref 39.0–52.0)
HEMATOCRIT: 38.2 % — AB (ref 39.0–52.0)
HEMOGLOBIN: 15.1 g/dL (ref 13.0–17.0)
Hemoglobin: 13 g/dL (ref 13.0–17.0)
LYMPHS ABS: 4.1 10*3/uL — AB (ref 0.7–4.0)
LYMPHS PCT: 47 % — AB (ref 12–46)
Lymphocytes Relative: 40 % (ref 12–46)
Lymphs Abs: 3.3 10*3/uL (ref 0.7–4.0)
MCH: 30.8 pg (ref 26.0–34.0)
MCH: 30.8 pg (ref 26.0–34.0)
MCHC: 34 g/dL (ref 30.0–36.0)
MCHC: 34.1 g/dL (ref 30.0–36.0)
MCV: 90.2 fL (ref 78.0–100.0)
MCV: 90.5 fL (ref 78.0–100.0)
MONOS PCT: 7 % (ref 3–12)
MONOS PCT: 7 % (ref 3–12)
Monocytes Absolute: 0.5 10*3/uL (ref 0.1–1.0)
Monocytes Absolute: 0.7 10*3/uL (ref 0.1–1.0)
NEUTROS ABS: 2.8 10*3/uL (ref 1.7–7.7)
NEUTROS ABS: 5.1 10*3/uL (ref 1.7–7.7)
NEUTROS PCT: 49 % (ref 43–77)
Neutrophils Relative %: 41 % — ABNORMAL LOW (ref 43–77)
Platelets: 132 10*3/uL — ABNORMAL LOW (ref 150–400)
Platelets: 200 10*3/uL (ref 150–400)
RBC: 4.22 MIL/uL (ref 4.22–5.81)
RBC: 4.91 MIL/uL (ref 4.22–5.81)
RDW: 12.7 % (ref 11.5–15.5)
RDW: 13 % (ref 11.5–15.5)
WBC: 10.2 10*3/uL (ref 4.0–10.5)
WBC: 7 10*3/uL (ref 4.0–10.5)

## 2014-06-07 LAB — BASIC METABOLIC PANEL
Anion gap: 15 (ref 5–15)
BUN: 8 mg/dL (ref 6–23)
CO2: 22 mmol/L (ref 19–32)
Calcium: 8.9 mg/dL (ref 8.4–10.5)
Chloride: 100 mEq/L (ref 96–112)
Creatinine, Ser: 0.72 mg/dL (ref 0.50–1.35)
GFR calc non Af Amer: >90 mL/min (ref >90)
GLUCOSE: 130 mg/dL — AB (ref 70–99)
POTASSIUM: 3.3 mmol/L — AB (ref 3.5–5.1)
Sodium: 137 mmol/L (ref 135–145)

## 2014-06-07 LAB — TROPONIN I
Troponin I: <0.03 ng/mL (ref <0.031)
Troponin I: <0.03 ng/mL (ref <0.031)

## 2014-06-07 LAB — I-STAT TROPONIN, ED: Troponin i, poc: 0 ng/mL (ref 0.00–0.08)

## 2014-06-07 LAB — RAPID URINE DRUG SCREEN, HOSP PERFORMED
Amphetamines: NOT DETECTED
BARBITURATES: NOT DETECTED
Benzodiazepines: NOT DETECTED
COCAINE: NOT DETECTED
Opiates: NOT DETECTED
TETRAHYDROCANNABINOL: NOT DETECTED

## 2014-06-07 LAB — TSH: TSH: 1.443 u[IU]/mL (ref 0.350–4.500)

## 2014-06-07 LAB — HEMOGLOBIN A1C
Hgb A1c MFr Bld: 5.4 % (ref <5.7)
Mean Plasma Glucose: 108 mg/dL (ref <117)

## 2014-06-07 LAB — ETHANOL: ALCOHOL ETHYL (B): 299 mg/dL — AB (ref 0–9)

## 2014-06-07 MED ORDER — SODIUM CHLORIDE 0.9 % IV BOLUS (SEPSIS)
500.0000 mL | Freq: Once | INTRAVENOUS | Status: AC
  Administered 2014-06-07: 500 mL via INTRAVENOUS

## 2014-06-07 MED ORDER — VITAMIN B-1 100 MG PO TABS
100.0000 mg | ORAL_TABLET | Freq: Every day | ORAL | Status: DC
  Administered 2014-06-07 – 2014-06-09 (×3): 100 mg via ORAL
  Filled 2014-06-07 (×3): qty 1

## 2014-06-07 MED ORDER — SODIUM CHLORIDE 0.9 % IV BOLUS (SEPSIS)
1000.0000 mL | Freq: Once | INTRAVENOUS | Status: AC
  Administered 2014-06-07: 1000 mL via INTRAVENOUS

## 2014-06-07 MED ORDER — ASPIRIN EC 81 MG PO TBEC
81.0000 mg | DELAYED_RELEASE_TABLET | Freq: Every day | ORAL | Status: DC
  Administered 2014-06-08 – 2014-06-09 (×2): 81 mg via ORAL
  Filled 2014-06-07 (×2): qty 1

## 2014-06-07 MED ORDER — ASPIRIN 300 MG RE SUPP
300.0000 mg | RECTAL | Status: AC
  Filled 2014-06-07: qty 1

## 2014-06-07 MED ORDER — ACETAMINOPHEN 325 MG PO TABS: 650.0000 mg | ORAL_TABLET | ORAL | Status: DC | PRN

## 2014-06-07 MED ORDER — OXYCODONE HCL 5 MG PO TABS
5.0000 mg | ORAL_TABLET | Freq: Four times a day (QID) | ORAL | Status: DC | PRN
  Administered 2014-06-07 – 2014-06-09 (×6): 5 mg via ORAL
  Filled 2014-06-07 (×6): qty 1

## 2014-06-07 MED ORDER — METOPROLOL TARTRATE 25 MG PO TABS
12.5000 mg | ORAL_TABLET | Freq: Two times a day (BID) | ORAL | Status: DC
  Administered 2014-06-07 – 2014-06-09 (×5): 12.5 mg via ORAL
  Filled 2014-06-07 (×5): qty 1

## 2014-06-07 MED ORDER — ATORVASTATIN CALCIUM 40 MG PO TABS
40.0000 mg | ORAL_TABLET | Freq: Every day | ORAL | Status: DC
  Administered 2014-06-07 – 2014-06-08 (×2): 40 mg via ORAL
  Filled 2014-06-07 (×3): qty 1

## 2014-06-07 MED ORDER — LORAZEPAM 1 MG PO TABS
1.0000 mg | ORAL_TABLET | Freq: Four times a day (QID) | ORAL | Status: DC | PRN
  Administered 2014-06-07 – 2014-06-09 (×8): 1 mg via ORAL
  Filled 2014-06-07 (×9): qty 1

## 2014-06-07 MED ORDER — IBUPROFEN 200 MG PO TABS
200.0000 mg | ORAL_TABLET | Freq: Three times a day (TID) | ORAL | Status: DC | PRN
  Administered 2014-06-07 – 2014-06-08 (×3): 200 mg via ORAL
  Filled 2014-06-07 (×3): qty 1

## 2014-06-07 MED ORDER — NITROGLYCERIN 2 % TD OINT
1.0000 [in_us] | TOPICAL_OINTMENT | Freq: Once | TRANSDERMAL | Status: AC
  Administered 2014-06-07: 1 [in_us] via TOPICAL
  Filled 2014-06-07: qty 30

## 2014-06-07 MED ORDER — INFLUENZA VAC SPLIT QUAD 0.5 ML IM SUSY
0.5000 mL | PREFILLED_SYRINGE | INTRAMUSCULAR | Status: AC
  Administered 2014-06-08: 0.5 mL via INTRAMUSCULAR
  Filled 2014-06-07 (×2): qty 0.5

## 2014-06-07 MED ORDER — POTASSIUM CHLORIDE ER 10 MEQ PO TBCR
40.0000 meq | EXTENDED_RELEASE_TABLET | Freq: Once | ORAL | Status: AC
  Administered 2014-06-07: 40 meq via ORAL
  Filled 2014-06-07: qty 4

## 2014-06-07 MED ORDER — LORAZEPAM 2 MG/ML IJ SOLN
1.0000 mg | Freq: Four times a day (QID) | INTRAMUSCULAR | Status: DC | PRN
  Administered 2014-06-08: 1 mg via INTRAVENOUS
  Filled 2014-06-07: qty 1

## 2014-06-07 MED ORDER — OXYCODONE-ACETAMINOPHEN 5-325 MG PO TABS: 2.0000 | ORAL_TABLET | Freq: Four times a day (QID) | ORAL | Status: DC | PRN

## 2014-06-07 MED ORDER — FOLIC ACID 1 MG PO TABS
1.0000 mg | ORAL_TABLET | Freq: Every day | ORAL | Status: DC
  Administered 2014-06-07 – 2014-06-09 (×3): 1 mg via ORAL
  Filled 2014-06-07 (×3): qty 1

## 2014-06-07 MED ORDER — NITROGLYCERIN 0.4 MG SL SUBL: 0.4000 mg | SUBLINGUAL_TABLET | SUBLINGUAL | Status: DC | PRN

## 2014-06-07 MED ORDER — ONDANSETRON HCL 4 MG/2ML IJ SOLN
4.0000 mg | Freq: Once | INTRAMUSCULAR | Status: AC
  Administered 2014-06-07: 4 mg via INTRAVENOUS
  Filled 2014-06-07: qty 2

## 2014-06-07 MED ORDER — PNEUMOCOCCAL VAC POLYVALENT 25 MCG/0.5ML IJ INJ
0.5000 mL | INJECTION | INTRAMUSCULAR | Status: AC
  Administered 2014-06-08: 0.5 mL via INTRAMUSCULAR
  Filled 2014-06-07 (×2): qty 0.5

## 2014-06-07 MED ORDER — HEPARIN SODIUM (PORCINE) 5000 UNIT/ML IJ SOLN
5000.0000 [IU] | Freq: Three times a day (TID) | INTRAMUSCULAR | Status: DC
  Administered 2014-06-07 – 2014-06-09 (×5): 5000 [IU] via SUBCUTANEOUS
  Filled 2014-06-07 (×9): qty 1

## 2014-06-07 MED ORDER — SODIUM CHLORIDE 0.9 % IV SOLN
INTRAVENOUS | Status: DC
  Administered 2014-06-07 – 2014-06-08 (×3): via INTRAVENOUS

## 2014-06-07 MED ORDER — THIAMINE HCL 100 MG/ML IJ SOLN
100.0000 mg | Freq: Every day | INTRAMUSCULAR | Status: DC
  Filled 2014-06-07 (×3): qty 1

## 2014-06-07 MED ORDER — ADULT MULTIVITAMIN W/MINERALS CH
1.0000 | ORAL_TABLET | Freq: Every day | ORAL | Status: DC
  Administered 2014-06-07 – 2014-06-09 (×3): 1 via ORAL
  Filled 2014-06-07 (×3): qty 1

## 2014-06-07 MED ORDER — POTASSIUM CHLORIDE CRYS ER 20 MEQ PO TBCR
40.0000 meq | EXTENDED_RELEASE_TABLET | Freq: Once | ORAL | Status: AC
  Administered 2014-06-07: 40 meq via ORAL
  Filled 2014-06-07: qty 2

## 2014-06-07 MED ORDER — PANTOPRAZOLE SODIUM 40 MG PO TBEC
40.0000 mg | DELAYED_RELEASE_TABLET | Freq: Every day | ORAL | Status: DC
  Administered 2014-06-07 – 2014-06-09 (×3): 40 mg via ORAL
  Filled 2014-06-07 (×3): qty 1

## 2014-06-07 MED ORDER — ASPIRIN 81 MG PO CHEW
324.0000 mg | CHEWABLE_TABLET | ORAL | Status: AC
  Administered 2014-06-07: 324 mg via ORAL
  Filled 2014-06-07: qty 4

## 2014-06-07 MED ORDER — ONDANSETRON HCL 4 MG/2ML IJ SOLN
4.0000 mg | Freq: Four times a day (QID) | INTRAMUSCULAR | Status: DC | PRN
  Administered 2014-06-08 (×2): 4 mg via INTRAVENOUS
  Filled 2014-06-07 (×2): qty 2

## 2014-06-07 MED ORDER — ASPIRIN 81 MG PO CHEW
324.0000 mg | CHEWABLE_TABLET | Freq: Once | ORAL | Status: AC
  Administered 2014-06-07: 324 mg via ORAL
  Filled 2014-06-07: qty 4
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
## 2014-06-07 NOTE — Progress Notes (Signed)
Patient was in possession of an empty bottle of vodka on arrival from the ED.  Patient stood up from stretcher and dropped bottle in trash on the way to the bed.  Will inform MD.
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
## 2014-06-07 NOTE — ED Notes (Signed)
EKG given to EDP,Knapp,Iva,MD., for review. 

## 2014-06-07 NOTE — ED Notes (Signed)
Pt arrived to the ED with a complaint of chest pain.  Pt is presently withdrawing from alcohol.  Pt states the chest pain is located center chest with radiation to the left arm.  Pt states that the pain also has cause back pain.  Pt's pain began at 1800 hrs 12/24.  Pt has had nausea and emesis

## 2014-06-08 ENCOUNTER — Observation Stay (HOSPITAL_COMMUNITY)
Admission: EM | Admit: 2014-06-08 | Discharge: 2014-06-08 | Disposition: A | Discharge status: Home / Self Care | Payer: Self-pay | Source: Home / Self Care | Attending: Cardiology | Admitting: Cardiology

## 2014-06-08 ENCOUNTER — Other Ambulatory Visit: Payer: Self-pay

## 2014-06-08 LAB — LIPID PANEL
Cholesterol: 117 mg/dL (ref 0–200)
HDL: 54 mg/dL (ref >39)
LDL CALC: 46 mg/dL (ref 0–99)
Total CHOL/HDL Ratio: 2.2 RATIO
Triglycerides: 86 mg/dL (ref <150)
VLDL: 17 mg/dL (ref 0–40)

## 2014-06-08 LAB — BASIC METABOLIC PANEL
Anion gap: 5 (ref 5–15)
BUN: 11 mg/dL (ref 6–23)
CALCIUM: 7.9 mg/dL — AB (ref 8.4–10.5)
CHLORIDE: 104 meq/L (ref 96–112)
CO2: 28 mmol/L (ref 19–32)
Creatinine, Ser: 0.97 mg/dL (ref 0.50–1.35)
GFR calc Af Amer: >90 mL/min (ref >90)
GFR calc non Af Amer: >90 mL/min (ref >90)
Glucose, Bld: 108 mg/dL — ABNORMAL HIGH (ref 70–99)
Potassium: 3.7 mmol/L (ref 3.5–5.1)
Sodium: 137 mmol/L (ref 135–145)

## 2014-06-08 LAB — CBC
HCT: 38.2 % — ABNORMAL LOW (ref 39.0–52.0)
HEMOGLOBIN: 12.7 g/dL — AB (ref 13.0–17.0)
MCH: 30.8 pg (ref 26.0–34.0)
MCHC: 33.2 g/dL (ref 30.0–36.0)
MCV: 92.7 fL (ref 78.0–100.0)
Platelets: 134 10*3/uL — ABNORMAL LOW (ref 150–400)
RBC: 4.12 MIL/uL — ABNORMAL LOW (ref 4.22–5.81)
RDW: 12.7 % (ref 11.5–15.5)
WBC: 8.8 10*3/uL (ref 4.0–10.5)

## 2014-06-08 MED ORDER — METOPROLOL TARTRATE 12.5 MG HALF TABLET: 12.5000 mg | ORAL_TABLET | Freq: Two times a day (BID) | ORAL | Status: DC

## 2014-06-08 MED ORDER — TECHNETIUM TC 99M SESTAMIBI GENERIC - CARDIOLITE
10.0000 | Freq: Once | INTRAVENOUS | Status: AC | PRN
  Administered 2014-06-08: 10 via INTRAVENOUS

## 2014-06-08 MED ORDER — REGADENOSON 0.4 MG/5ML IV SOLN
INTRAVENOUS | Status: AC
  Administered 2014-06-08: 0.4 mg via INTRAVENOUS
  Filled 2014-06-08: qty 5

## 2014-06-08 MED ORDER — PANTOPRAZOLE SODIUM 40 MG PO TBEC: 40.0000 mg | DELAYED_RELEASE_TABLET | Freq: Every day | ORAL | Status: DC

## 2014-06-08 MED ORDER — THIAMINE HCL 100 MG PO TABS: 100.0000 mg | ORAL_TABLET | Freq: Every day | ORAL | Status: DC

## 2014-06-08 MED ORDER — TECHNETIUM TC 99M SESTAMIBI GENERIC - CARDIOLITE
30.0000 | Freq: Once | INTRAVENOUS | Status: AC | PRN
  Administered 2014-06-08: 30 via INTRAVENOUS

## 2014-06-08 NOTE — Progress Notes (Signed)
Dc instructions given, pt verb understanding but ride cannot come until 9pm

## 2014-06-08 NOTE — Discharge Summary (Signed)
Physician Discharge Summary  Patient ID: Brent HeightJohn F Gay MRN: [id number] DOB/AGE: 50/07/1965 50 y.o.  Admit date: 06/06/2014 Discharge date: 06/08/2014  Admission Diagnoses: Recurrent chest pain (with minor EKG changes worrisome for angina) Rule out MI Hypertension GERD History of alcoholic liver disease EtOH abuse Tobacco use Depression  Discharge Diagnoses:  Principle Problem: * Chest pain * Hypertension GERD History of alcoholic liver disease EtOH abuse Tobacco use Depression  Discharged Condition: fair  Hospital Course: 48 year old male with past medical history significant for hypertension, alcoholic liver disease alcohol abuse, GERD, depression, tobacco use disorder, questionable history of angina, came to the ER complaining of retrosternal chest pain described as a weight on the chest radiating to left arm and left side of the chest associated with mild shortness of breath diaphoresis nausea and vomiting since day befgore admission. EKG initially done in the ER showed ST-T wave changes in inferolateral leads received sublingual nitroglycerin with normalization of EKG and relief of chest pain. Nuclear stress test was without reversible ischemia with EF of 59 %. Patient was advised to refrain from alcohol as soon as possible.  Consults: cardiology  Significant Diagnostic Studies: labs: Normal lipid panel, Near normal CBC and CMET.  Nuclear stress test without reversible ischemia.  Chest X-ray-No acute cardiopulmonary process.  Treatments: cardiac meds: metoprolol. GI Med-Pantoprazole.  Discharge Exam: Blood pressure 140/87, pulse 77, temperature 97.4 F (36.3 C), temperature source Oral, resp. rate 18, Gay 5\' 9"  (1.753 m), weight 93.895 kg (207 lb), SpO2 99 %. Physical Exam  Constitutional: He is oriented to person, place, and time.  HENT: Head: Normocephalic and atraumatic. Eyes: Conjunctivae are normal. Pupils are equal, round, and reactive to light.   Neck:  Normal range of motion. Neck supple. No JVD present. No tracheal deviation present. No thyromegaly present.  Cardiovascular: Normal rate and regular rhythm. Exam reveals no friction rub.No murmur heard. Respiratory: Effort normal and breath sounds normal. No respiratory distress. He has no wheezes.   GI: Soft. Bowel sounds are normal. He exhibits no distension. There is no tenderness. There is no rebound.  Musculoskeletal: He exhibits no edema or tenderness.  Neurological: He is alert and oriented to person, place, and time.  Disposition: 01-Home or Self Care     Medication List    TAKE these medications        metoprolol tartrate 12.5 mg Tabs tablet  Commonly known as:  LOPRESSOR  Take 0.5 tablets (12.5 mg total) by mouth 2 (two) times daily.     multivitamin with minerals Tabs tablet  Take 1 tablet by mouth daily.     pantoprazole 40 MG tablet  Commonly known as:  PROTONIX  Take 1 tablet (40 mg total) by mouth daily.     thiamine 100 MG tablet  Take 1 tablet (100 mg total) by mouth daily.           Follow-up Information    Follow up with Robynn PaneHARWANI,MOHAN N, MD. Schedule an appointment as soon as possible for a visit in 1 week.   Specialty:  Cardiology   Contact information:   [address] [place] [address] [phone]       Signed: Ricki RodriguezKADAKIA,Brent Gay 06/08/2014, 6:01 PM

## 2014-06-08 NOTE — Progress Notes (Signed)
Spoke to Dr. Algie CofferKadakia regarding patient's complaints of having diarrhea. Vital signs checked and relayed to Dr. Algie CofferKadakia. Patient is still for discharge tonight as per Dr. Algie CofferKadakia. Instructed patient to stay on liquid diet tonight when he goes home, instructed to take imodium for every loose BM and to call the doctor's office if diarrhea persisted. Patient undestood the instruction. Waiting for a family to get him tonight.

## 2014-06-08 NOTE — Progress Notes (Signed)
UR completed 

## 2014-06-08 NOTE — Progress Notes (Signed)
Patient feels like he is not stable to go home, he is complaining of dizziness and weakness. Vital signs stable. Dr. Algie CofferKadakia was made aware and agreed for the patient to stay overnight. We will continue to monitor patient.

## 2014-06-09 NOTE — Clinical Social Work Note (Signed)
CSW reviewed consults on 06/09/14 and consult was given on this pt on 06/07/14 for substance abuse  CSW pulled up pt chart for review and chart reflected discharge summary dated today 06/09/14  .Elray Bubaegina Adja Ruff, LCSW [facility] Clinical Social Worker - Weekend Coverage cell #: [phone]

## 2014-06-09 NOTE — Discharge Summary (Signed)
Physician Discharge Summary  Patient ID: Brent Gay HeightJohn F Gay MRN: [id number] DOB/AGE: 50/07/1965 50 y.o.  Admit date: 06/06/2014 Discharge date: 06/09/2014  Admission Diagnoses: Recurrent chest pain (with minor EKG changes worrisome for angina) Rule out MI Hypertension GERD History of alcoholic liver disease EtOH abuse Tobacco use Depression  Discharge Diagnoses:  Principle Problem: * Chest pain * Hypertension GERD History of alcoholic liver disease EtOH abuse Tobacco use Depression  Discharged Condition: fair  Hospital Course: 48 year old male with past medical history significant for hypertension, alcoholic liver disease alcohol abuse, GERD, depression, tobacco use disorder, questionable history of angina, came to the ER complaining of retrosternal chest pain described as a weight on the chest radiating to left arm and left side of the chest associated with mild shortness of breath diaphoresis nausea and vomiting since day befgore admission. EKG initially done in the ER showed ST-T wave changes in inferolateral leads received sublingual nitroglycerin with normalization of EKG and relief of chest pain. Nuclear stress test was without reversible ischemia with EF of 59 %. Patient was advised to refrain from alcohol as soon as possible. His diarrhea improved overnight. He will see primary care in 1 week.  Consults: cardiology  Significant Diagnostic Studies: labs: Normal lipid panel, Near normal CBC and CMET.  Nuclear stress test without reversible ischemia.  Chest X-ray-No acute cardiopulmonary process.   Treatments: cardiac meds: metoprolol. GI: Pantoprazole  Discharge Exam: Blood pressure 148/80, pulse 70, temperature 98.1 F (36.7 C), temperature source Oral, resp. rate 18, height 5\' 9"  (1.753 m), weight 93.895 kg (207 lb), SpO2 100 %.  Physical Exam  Constitutional: He is oriented to person, place, and time.  HENT: Head: Normocephalic and atraumatic. Eyes: Conjunctivae are  normal. Pupils are equal, round, and reactive to light.  Neck: Normal range of motion. Neck supple. No JVD present. No tracheal deviation present. No thyromegaly present.  Cardiovascular: Normal rate and regular rhythm. Exam reveals no friction rub.No murmur heard. Respiratory: Effort normal and breath sounds normal. No respiratory distress. He has no wheezes.  GI: Soft. Bowel sounds are normal. He exhibits no distension. There is no tenderness. There is no rebound.  Musculoskeletal: He exhibits no edema or tenderness.  Neurological: He is alert and oriented to person, place, and time.  Disposition: 01-Home or Self Care     Medication List    TAKE these medications        metoprolol tartrate 12.5 mg Tabs tablet  Commonly known as:  LOPRESSOR  Take 0.5 tablets (12.5 mg total) by mouth 2 (two) times daily.     multivitamin with minerals Tabs tablet  Take 1 tablet by mouth daily.     pantoprazole 40 MG tablet  Commonly known as:  PROTONIX  Take 1 tablet (40 mg total) by mouth daily.     thiamine 100 MG tablet  Take 1 tablet (100 mg total) by mouth daily.           Follow-up Information    Follow up with Robynn PaneHARWANI,MOHAN N, MD. Schedule an appointment as soon as possible for a visit in 1 week.   Specialty:  Cardiology   Contact information:   [address] [place] [address] [phone]       Signed: Ricki RodriguezKADAKIA,Gwyn Mehring S 06/09/2014, 12:37 PM

## 2014-06-11 DIAGNOSIS — I10 Essential (primary) hypertension: Secondary | ICD-10-CM | POA: Insufficient documentation

## 2014-06-11 DIAGNOSIS — K219 Gastro-esophageal reflux disease without esophagitis: Secondary | ICD-10-CM | POA: Insufficient documentation

## 2014-06-11 DIAGNOSIS — F329 Major depressive disorder, single episode, unspecified: Secondary | ICD-10-CM | POA: Insufficient documentation

## 2014-06-11 DIAGNOSIS — F10120 Alcohol abuse with intoxication, uncomplicated: Secondary | ICD-10-CM | POA: Insufficient documentation

## 2014-06-11 DIAGNOSIS — Z87891 Personal history of nicotine dependence: Secondary | ICD-10-CM | POA: Insufficient documentation

## 2014-06-11 DIAGNOSIS — G8929 Other chronic pain: Secondary | ICD-10-CM | POA: Insufficient documentation

## 2014-06-11 DIAGNOSIS — Z79899 Other long term (current) drug therapy: Secondary | ICD-10-CM | POA: Insufficient documentation

## 2014-06-12 ENCOUNTER — Encounter (HOSPITAL_COMMUNITY): Payer: Self-pay

## 2014-06-12 ENCOUNTER — Emergency Department (HOSPITAL_COMMUNITY)
Admission: EM | Admit: 2014-06-12 | Discharge: 2014-06-12 | Disposition: A | Discharge status: Home / Self Care | Payer: MEDICAID | Attending: Emergency Medicine | Admitting: Emergency Medicine

## 2014-06-12 ENCOUNTER — Other Ambulatory Visit: Payer: Self-pay

## 2014-06-12 ENCOUNTER — Emergency Department (HOSPITAL_COMMUNITY)
Admission: EM | Admit: 2014-06-12 | Discharge: 2014-06-12 | Disposition: A | Discharge status: Home / Self Care | Payer: Self-pay | Attending: Emergency Medicine | Admitting: Emergency Medicine

## 2014-06-12 ENCOUNTER — Encounter (HOSPITAL_COMMUNITY): Payer: Self-pay | Admitting: Emergency Medicine

## 2014-06-12 ENCOUNTER — Emergency Department (HOSPITAL_COMMUNITY): Payer: Self-pay

## 2014-06-12 DIAGNOSIS — I10 Essential (primary) hypertension: Secondary | ICD-10-CM | POA: Insufficient documentation

## 2014-06-12 DIAGNOSIS — F10920 Alcohol use, unspecified with intoxication, uncomplicated: Secondary | ICD-10-CM

## 2014-06-12 DIAGNOSIS — Z79899 Other long term (current) drug therapy: Secondary | ICD-10-CM | POA: Insufficient documentation

## 2014-06-12 DIAGNOSIS — G8929 Other chronic pain: Secondary | ICD-10-CM | POA: Insufficient documentation

## 2014-06-12 DIAGNOSIS — Z8659 Personal history of other mental and behavioral disorders: Secondary | ICD-10-CM | POA: Insufficient documentation

## 2014-06-12 DIAGNOSIS — R1013 Epigastric pain: Secondary | ICD-10-CM | POA: Insufficient documentation

## 2014-06-12 DIAGNOSIS — F10120 Alcohol abuse with intoxication, uncomplicated: Secondary | ICD-10-CM | POA: Insufficient documentation

## 2014-06-12 DIAGNOSIS — R079 Chest pain, unspecified: Secondary | ICD-10-CM | POA: Insufficient documentation

## 2014-06-12 DIAGNOSIS — F419 Anxiety disorder, unspecified: Secondary | ICD-10-CM | POA: Insufficient documentation

## 2014-06-12 DIAGNOSIS — Z87891 Personal history of nicotine dependence: Secondary | ICD-10-CM | POA: Insufficient documentation

## 2014-06-12 DIAGNOSIS — K219 Gastro-esophageal reflux disease without esophagitis: Secondary | ICD-10-CM | POA: Insufficient documentation

## 2014-06-12 LAB — CBC WITH DIFFERENTIAL/PLATELET
Basophils Absolute: 0 10*3/uL (ref 0.0–0.1)
Basophils Relative: 1 % (ref 0–1)
EOS ABS: 0.4 10*3/uL (ref 0.0–0.7)
Eosinophils Relative: 5 % (ref 0–5)
HEMATOCRIT: 42.9 % (ref 39.0–52.0)
Hemoglobin: 14.8 g/dL (ref 13.0–17.0)
LYMPHS ABS: 3.4 10*3/uL (ref 0.7–4.0)
LYMPHS PCT: 48 % — AB (ref 12–46)
MCH: 31 pg (ref 26.0–34.0)
MCHC: 34.5 g/dL (ref 30.0–36.0)
MCV: 89.9 fL (ref 78.0–100.0)
MONO ABS: 0.4 10*3/uL (ref 0.1–1.0)
MONOS PCT: 6 % (ref 3–12)
Neutro Abs: 2.8 10*3/uL (ref 1.7–7.7)
Neutrophils Relative %: 40 % — ABNORMAL LOW (ref 43–77)
Platelets: 155 10*3/uL (ref 150–400)
RBC: 4.77 MIL/uL (ref 4.22–5.81)
RDW: 13.3 % (ref 11.5–15.5)
WBC: 7 10*3/uL (ref 4.0–10.5)

## 2014-06-12 LAB — ETHANOL: ALCOHOL ETHYL (B): 355 mg/dL — AB (ref 0–9)

## 2014-06-12 LAB — I-STAT TROPONIN, ED
TROPONIN I, POC: 0 ng/mL (ref 0.00–0.08)
Troponin i, poc: 0 ng/mL (ref 0.00–0.08)

## 2014-06-12 LAB — BASIC METABOLIC PANEL
Anion gap: 10 (ref 5–15)
CALCIUM: 8.5 mg/dL (ref 8.4–10.5)
CO2: 25 mmol/L (ref 19–32)
CREATININE: 0.91 mg/dL (ref 0.50–1.35)
Chloride: 111 mEq/L (ref 96–112)
GFR calc Af Amer: >90 mL/min (ref >90)
GLUCOSE: 97 mg/dL (ref 70–99)
Potassium: 3.8 mmol/L (ref 3.5–5.1)
Sodium: 146 mmol/L — ABNORMAL HIGH (ref 135–145)

## 2014-06-12 MED ORDER — LORAZEPAM 1 MG PO TABS
1.0000 mg | ORAL_TABLET | Freq: Once | ORAL | Status: AC
  Administered 2014-06-12: 1 mg via ORAL
  Filled 2014-06-12: qty 1

## 2014-06-12 MED ORDER — ONDANSETRON HCL 4 MG/2ML IJ SOLN
4.0000 mg | Freq: Once | INTRAMUSCULAR | Status: AC
  Administered 2014-06-12: 4 mg via INTRAVENOUS
  Filled 2014-06-12: qty 2

## 2014-06-12 MED ORDER — SODIUM CHLORIDE 0.9 % IV BOLUS (SEPSIS)
1000.0000 mL | Freq: Once | INTRAVENOUS | Status: AC
  Administered 2014-06-12: 1000 mL via INTRAVENOUS

## 2014-06-12 MED ORDER — PANTOPRAZOLE SODIUM 40 MG IV SOLR
40.0000 mg | Freq: Once | INTRAVENOUS | Status: AC
  Administered 2014-06-12: 40 mg via INTRAVENOUS
  Filled 2014-06-12: qty 40

## 2014-06-12 MED ORDER — OMEPRAZOLE 20 MG PO CPDR: 20.0000 mg | DELAYED_RELEASE_CAPSULE | Freq: Every day | ORAL | Status: DC

## 2014-06-12 MED ORDER — IBUPROFEN 400 MG PO TABS
600.0000 mg | ORAL_TABLET | Freq: Once | ORAL | Status: AC
  Administered 2014-06-12: 600 mg via ORAL
  Filled 2014-06-12 (×2): qty 1

## 2014-06-12 NOTE — ED Notes (Signed)
Called to triage room x3, no answer from lobby.

## 2014-06-12 NOTE — ED Notes (Signed)
Pt requesting alcohol detox. Pt states he drank one-half gallon of "hard liquor" today. Denies SI/HI

## 2014-06-12 NOTE — ED Notes (Signed)
Pt O2 saturation level down to 84% while asleep. Pt placed on 3L nasal cannula. O2 saturation now 96%.

## 2014-06-12 NOTE — ED Notes (Signed)
Pt alert and oriented. Pt continues to have auditory hallucinations.

## 2014-06-12 NOTE — ED Notes (Signed)
Patient transported to X-ray 

## 2014-06-12 NOTE — ED Notes (Signed)
[facility] and possible withdrawal. Pt requesting detox, last drink was yesterday around 2pm. Pt reporting nausea. 12 lead unremarkable with EMS. Pt has hx of angina.

## 2014-06-12 NOTE — Discharge Instructions (Signed)
Aspirin and Your Heart Aspirin affects the way your blood clots and helps "thin" the blood. Aspirin has many uses in heart disease. It may be used as a primary prevention to help reduce the risk of heart related events. It also can be used as a secondary measure to prevent more heart attacks or to prevent additional damage from blood clots.  ASPIRIN MAY HELP IF YOU:  Have had a heart attack or chest pain.  Have undergone open heart surgery such as CABG (Coronary Artery Bypass Surgery).  Have had coronary angioplasty with or without stents.  Have experienced a stroke or TIA (transient ischemic attack).  Have peripheral vascular disease (PAD).  Have chronic heart rhythm problems such as atrial fibrillation.  Are at risk for heart disease. BEFORE STARTING ASPIRIN Before you start taking aspirin, your caregiver will need to review your medical history. Many things will need to be taken into consideration, such as:  Smoking status.  Blood pressure.  Diabetes.  Gender.  Weight.  Cholesterol level. ASPIRIN DOSES  Aspirin should only be taken on the advice of your caregiver. Talk to your caregiver about how much aspirin you should take. Aspirin comes in different doses such as:  81 mg.  162 mg.  325 mg.  The aspirin dose you take may be affected by many factors, some of which include:  Your current medications, especially if your are taking blood-thinners or anti-platelet medicine.  Liver function.  Heart disease risk.  Age.  Aspirin comes in two forms:  Non-enteric-coated. This type of aspirin does not have a coating and is absorbed faster. Non-enteric coated aspirin is recommended for patients experiencing chest pain symptoms. This type of aspirin also comes in a chewable form.  Enteric-coated. This means the aspirin has a special coating that releases the medicine very slowly. Enteric-coated aspirin causes less stomach upset. This type of aspirin should not be chewed  or crushed. ASPIRIN SIDE EFFECTS Daily use of aspirin can increase your risk of serious side effects. Some of these include:  Increased bleeding. This can range from a cut that does not stop bleeding to more serious problems such as stomach bleeding or bleeding into the brain (Intracerebral bleeding).  Increased bruising.  Stomach upset.  An allergic reaction such as red, itchy skin.  Increased risk of bleeding when combined with non-steroidal anti-inflammatory medicine (NSAIDS).  Alcohol should be drank in moderation when taking aspirin. Alcohol can increase the risk of stomach bleeding when taken with aspirin.  Aspirin should not be given to children less than 29 years of age due to the association of Reye syndrome. Reye syndrome is a serious illness that can affect the brain and liver. Studies have linked Reye syndrome with aspirin use in children.  People that have nasal polyps have an increased risk of developing an aspirin allergy. SEEK MEDICAL CARE IF:   You develop an allergic reaction such as:  Hives.  Itchy skin.  Swelling of the lips, tongue or face.  You develop stomach pain.  You have unusual bleeding or bruising.  You have ringing in your ears. SEEK IMMEDIATE MEDICAL CARE IF:   You have severe chest pain, especially if the pain is crushing or pressure-like and spreads to the arms, back, neck, or jaw. THIS IS AN EMERGENCY. Do not wait to see if the pain will go away. Get medical help at once. Call your local emergency services (911 in the [place] DO NOT drive yourself to the hospital.  You have stroke-like symptoms  such as:  Loss of vision.  Difficulty talking.  Numbness or weakness on one side of your body.  Numbness or weakness in your arm or leg.  Not thinking clearly or feeling confused.  Your bowel movements are bloody, dark red or black in color.  You vomit or cough up blood.  You have blood in your urine.  You have shortness of breath,  coughing or wheezing. MAKE SURE YOU:   Understand these instructions.  Will monitor your condition.  Seek immediate medical care if necessary. Document Released: 05/13/2008 Document Revised: 09/25/2012 Document Reviewed: 09/05/2013 [facility] Patient Information 2015 [employer] [place]. This information is not intended to replace advice given to you by your health care provider. Make sure you discuss any questions you have with your health care provider. Peptic Ulcer A peptic ulcer is a sore in the lining of your esophagus (esophageal ulcer), stomach (gastric ulcer), or in the first part of your small intestine (duodenal ulcer). The ulcer causes erosion into the deeper tissue. CAUSES  Normally, the lining of the stomach and the small intestine protects itself from the acid that digests food. The protective lining can be damaged by:  An infection caused by a bacterium called Helicobacter pylori (H. pylori).  Regular use of nonsteroidal anti-inflammatory drugs (NSAIDs), such as ibuprofen or aspirin.  Smoking tobacco. Other risk factors include being older than 50, drinking alcohol excessively, and having a family history of ulcer disease.  SYMPTOMS   Burning pain or gnawing in the area between the chest and the belly button.  Heartburn.  Nausea and vomiting.  Bloating. The pain can be worse on an empty stomach and at night. If the ulcer results in bleeding, it can cause:  Black, tarry stools.  Vomiting of bright red blood.  Vomiting of coffee-ground-looking materials. DIAGNOSIS  A diagnosis is usually made based upon your history and an exam. Other tests and procedures may be performed to find the cause of the ulcer. Finding a cause will help determine the best treatment. Tests and procedures may include:  Blood tests, stool tests, or breath tests to check for the bacterium H. pylori.  An upper gastrointestinal (GI) series of the esophagus, stomach, and small intestine.  An  endoscopy to examine the esophagus, stomach, and small intestine.  A biopsy. TREATMENT  Treatment may include:  Eliminating the cause of the ulcer, such as smoking, NSAIDs, or alcohol.  Medicines to reduce the amount of acid in your digestive tract.  Antibiotic medicines if the ulcer is caused by the H. pylori bacterium.  An upper endoscopy to treat a bleeding ulcer.  Surgery if the bleeding is severe or if the ulcer created a hole somewhere in the digestive system. HOME CARE INSTRUCTIONS   Avoid tobacco, alcohol, and caffeine. Smoking can increase the acid in the stomach, and continued smoking will impair the healing of ulcers.  Avoid foods and drinks that seem to cause discomfort or aggravate your ulcer.  Only take medicines as directed by your caregiver. Do not substitute over-the-counter medicines for prescription medicines without talking to your caregiver.  Keep any follow-up appointments and tests as directed. SEEK MEDICAL CARE IF:   Your do not improve within 7 days of starting treatment.  You have ongoing indigestion or heartburn. SEEK IMMEDIATE MEDICAL CARE IF:   You have sudden, sharp, or persistent abdominal pain.  You have bloody or dark black, tarry stools.  You vomit blood or vomit that looks like coffee grounds.  You become light-headed, weak,  or feel faint.  You become sweaty or clammy. MAKE SURE YOU:   Understand these instructions.  Will watch your condition.  Will get help right away if you are not doing well or get worse. Document Released: 05/28/2000 Document Revised: 10/15/2013 Document Reviewed: 12/29/2011 [facility] Patient Information 2015 [employer], [place]. This information is not intended to replace advice given to you by your health care provider. Make sure you discuss any questions you have with your health care provider.

## 2014-06-12 NOTE — Discharge Instructions (Signed)
Alcohol Intoxication °Alcohol intoxication occurs when you drink enough alcohol that it affects your ability to function. It can be mild or very severe. Drinking a lot of alcohol in a short time is called binge drinking. This can be very harmful. Drinking alcohol can also be more dangerous if you are taking medicines or other drugs. Some of the effects caused by alcohol may include: °· Loss of coordination. °· Changes in mood and behavior. °· Unclear thinking. °· Trouble talking (slurred speech). °· Throwing up (vomiting). °· Confusion. °· Slowed breathing. °· Twitching and shaking (seizures). °· Loss of consciousness. °HOME CARE °· Do not drive after drinking alcohol. °· Drink enough water and fluids to keep your pee (urine) clear or pale yellow. Avoid caffeine. °· Only take medicine as told by your doctor. °GET HELP IF: °· You throw up (vomit) many times. °· You do not feel better after a few days. °· You frequently have alcohol intoxication. Your doctor can help decide if you should see a substance use treatment counselor. °GET HELP RIGHT AWAY IF: °· You become shaky when you stop drinking. °· You have twitching and shaking. °· You throw up blood. It may look bright red or like coffee grounds. °· You notice blood in your poop (bowel movements). °· You become lightheaded or pass out (faint). °MAKE SURE YOU:  °· Understand these instructions. °· Will watch your condition. °· Will get help right away if you are not doing well or get worse. °Document Released: 11/17/2007 Document Revised: 01/31/2013 Document Reviewed: 11/03/2012 °ExitCare® Patient Information ©2015 [employer]. This information is not intended to replace advice given to you by your health care provider. Make sure you discuss any questions you have with your health care provider. ° ° °Emergency Department Resource Guide °1) Find a Doctor and Pay Out of Pocket °Although you won't have to find out who is covered by your insurance plan, it is a good  idea to ask around and get recommendations. You will then need to call the office and see if the doctor you have chosen will accept you as a new patient and what types of options they offer for patients who are self-pay. Some doctors offer discounts or will set up payment plans for their patients who do not have insurance, but you will need to ask so you aren't surprised when you get to your appointment. ° °2) Contact Your Local Health Department °Not all health departments have doctors that can see patients for sick visits, but many do, so it is worth a call to see if yours does. If you don't know where your local health department is, you can check in your phone book. The CDC also has a tool to help you locate your state's health department, and many state websites also have listings of all of their local health departments. ° °3) Find a Walk-in Clinic °If your illness is not likely to be very severe or complicated, you may want to try a walk in clinic. These are popping up all over the country in pharmacies, drugstores, and shopping centers. They're usually staffed by nurse practitioners or physician assistants that have been trained to treat common illnesses and complaints. They're usually fairly quick and inexpensive. However, if you have serious medical issues or chronic medical problems, these are probably not your best option. ° °No Primary Care Doctor: °- Call [facility] at  832-8000 - they can help you locate a primary care doctor that  accepts your insurance, provides   certain services, etc. °- Physician Referral Service- 1-800-533-3463 ° °Chronic Pain Problems: °Organization         Address  Phone   Notes  °[facility]  (336) 297-2271 Patients need to be referred by their primary care doctor.  ° °Medication Assistance: °Organization         Address  Phone   Notes  °[place] [facility] [address] °[place], [place] [address] (336) 641-8030  --Must be a resident of [place] °-- Must have NO insurance coverage whatsoever (no Medicaid/ Medicare, etc.) °-- The pt. MUST have a primary care doctor that directs their care regularly and follows them in the community °  °[facility]  (866) 331-1348   °[employer]  (888) 892-1162   ° °Agencies that provide inexpensive medical care: °Organization         Address  Phone   Notes  °[facility]  (336) 832-8035   °[facility]    (336) 832-7272   °[facility] [address] °[place], [place] [address] (336) 832-4777   °[facility] [address], °[place] (336) 271-4999   °[facility]    (336) 373-0678   °[facility]    (336) 272-1050   °[facility] ° [address], [place] Phone:  (336) 832-4444, Fax:  (336) 832-4440 Hours of Operation:  9 am - 6 pm, M-F.  Also accepts Medicaid/Medicare and self-pay.  °[facility] ° [address], [place] Phone: (336) 832-3150, Fax: (336) 832-3151. Hours of Operation:  8:30 am - 5:30 pm, M-F.  Also accepts Medicaid and self-pay.  °[facility] [place] [address], [place] Phone: (336) 878-6027   °[facility] [address], [place], [place] (336)723-1848, Ext. 123 Mondays & Thursdays: 7-9 AM.  First 15 patients are seen on a first come, first serve basis. °  ° °Medicaid-accepting [place] Providers: ° °Organization         Address  Phone   Notes  °[facility] [address], [place] (336) 641-2100 Also accepts self-pay patients.  °[facility] [address], [place] ° (336) 856-9996   °[facility] [address], [place] (336) 288-8857   °[facility] [address] [place] [address], [place] (336) 299-7000   °Veita Bland [address], [place]  ° (336) 373-1557 Only  accepts [place] Access Medicaid patients after they have their name applied to their card.  ° °Self-Pay (no insurance) in [place]: ° °Organization         Address  Phone   Notes  °Sickle Cell Patients, [facility] [address], [place] (336) 832-1970   °[facility] [address], [place] (336) 832-4400   °[facility] ° [address], [place] (336) 992-4800   °[facility]/Dr. Osei-Bonsu ° [address] [place] [address], [place] or [address], [place] (336) 841-8500 Phone number for both [place] and [place] locations is the same.  °[facility] [address], [place] (336) 299-0000   °[facility] [address] [place] [address], [place] or [address] (336) 852-7530 °(336) 878-2260   °[facility] [address], [place] (336) 350-1642, phone; (336) 294-5005, fax Sees patients 1st and 3rd Saturday of every month.  Must not qualify for public or private insurance (i.e. Medicaid, Medicare, [place] Health Choice, Veterans' Benefits) • Household income should be no more than 200% of the poverty level •The clinic cannot treat you if you are pregnant or think you are pregnant • Sexually transmitted diseases are not treated at the clinic.  ° ° °Dental Care: °Organization         Address  Phone  Notes  °[facility] [address], [place] (336) 641-6152 Accepts children up to age 21 who are enrolled in Medicaid or [place] Health Choice; pregnant women with a Medicaid card; and children who have applied for Medicaid or [place] Health Choice, but were declined, whose parents can pay a reduced fee at time of service.  °[facility] [place]  [address], [place] (336) 641-7733 Accepts children up to age 21 who are enrolled in Medicaid or [place] Health Choice; pregnant  women with a Medicaid card; and children who have applied for Medicaid or [place] Health Choice, but were declined, whose parents can pay a reduced fee at time of service.  °[facility] ° [address], [place] (336) 641-4533 Patients are seen by appointment only. Walk-ins are not accepted. [facility] will see patients 18 years of age and older. °Monday - Tuesday (8am-5pm) °Most Wednesdays (8:30-5pm) °$30 per visit, cash only  °[facility] ° [address], [place] (336) 641-4533 Patients are seen by appointment only. Walk-ins are not accepted. [facility] will see patients 18 years of age and older. °One Wednesday Evening (Monthly: Volunteer Based).  $30 per visit, cash only  °[facility]  (919) 537-3737 for adults; Children under age 4, call Graduate Pediatric Dentistry at (919) [phone]. Children aged 4-14, please call (919) [phone] to request a pediatric application. ° Dental services are provided in all areas of dental care including fillings, crowns and bridges, complete and partial dentures, implants, gum treatment, root canals, and extractions. Preventive care is also provided. Treatment is provided to both adults and children. °Patients are selected via a lottery and there is often a waiting list. °  °[facility] [address], °[place] ° (336) 763-8833 [email or web address] °  °[facility] [address], [place], [place] (336)723-1848, Ext. 123 Second and Fourth Thursday of each month, opens at 6:30 AM; Clinic ends at 9 AM.  Patients are seen on a first-come first-served basis, and a limited number are seen during each clinic.  ° °[facility] ° [address], [place], [place] (336) 723-7904   Eligibility Requirements °You must have lived in [place], or [place] counties for at least the last three months. °  You cannot be eligible for state or federal sponsored  healthcare insurance, including Veterans Administration, Medicaid, or Medicare. °  You generally cannot be eligible for healthcare insurance through your employer.  °  How to apply: °Eligibility screenings are held every Tuesday and Wednesday afternoon from 1:00 pm until 4:00 pm. You do not need an appointment for the interview!  °[facility] [address], [place], [place] 336-631-2330   °[facility]  336-342-8273   °[facility]  336-703-3100   °[facility]  336-570-6415   ° °  Behavioral Health Resources in the Community: °Intensive Outpatient Programs °Organization         Address  Phone  Notes  °[facility] [address], [place], [place] 336-878-6098   °[facility] [address], [place], [place] 336-832-9800   °[facility] [address], [place], [place] ° 336-882-2125   °[facility] [address],  °[place], [place] 1-800-853-5163 or 336-641-4981   °Substance Abuse Resources °Organization         Address  Phone  Notes  °[facility]  336-882-2125   °[facility]  336-784-9470   °[facility]  336-285-9073   °[facility]  336-845-3988   °Residential & Outpatient Substance Abuse Program  1-800-659-3381   °Psychological Services °Organization         Address  Phone  Notes  °[facility]  336- 832-9600   °[facility]  336- 378-7881   °[facility] [address], [place] 1-800-853-5163 or 336-641-4981   ° °Mobile Crisis Teams °Organization         Address  Phone  Notes  °[facility], Mobile Crisis Care Unit  1-877-626-1772   °Assertive °[facility] ° [address] [place], [place] 336-834-9664   °Sharon DeEsch [address] °[place] 336-554-5454   ° °Self-Help/Support Groups °Organization         Address  Phone              Notes  °[facility] [place] [facility] - variety of support groups  336- 373-1402 Call for more information  °Narcotics Anonymous (NA), [facility] [address], °[place]  2 meetings at this location  ° °Residential Treatment Programs °Organization         Address  Phone  Notes  °[facility] [address],    °[place]  1-866-801-8205   °[facility] ° [address], [place], [place] 704-293-8524   °[facility] [address], [place] 336-845-3988 Admissions: 8am-3pm M-F  °[facility] [address]    °[place], [place] 336-841-1104   °[facility] [address], [place], [place] 336-379-7146   °[facility] [address]  °[place], [place] 336-285-9073   °[facility] Intensive Outpatient [address], [place], [place] 336-852-3033   °[facility] [address]  °[place], [place] 1-877-615-2722 or 336-784-9470   °[facility] [address] [place], [place] 336-227-7417 Accepts Medicaid  °[facility] [address]  °[place] 1-800-659-3381 Substance Abuse/Addiction Treatment  ° °[place] Behavioral Health Resources °Organization         Address  Phone  Notes  °[facility]  (888) 581-9988   °Julie Brannon, PhD [address] [place], [place]   (336) 349-5553 or (336) 951-0000   °[facility]   [address] °[place], [place] (336) 349-4454   °[facility] [address], [place],  (336) 342-8316 Insurance/Medicaid/sponsorship through [facility]  °[facility] [address]                                    [place],  (336) 342-8316 Therapy/tele-psych/case  °[facility] [address]  ° [place],   [place] (336) 349-2233    °Dr. Arfeen  (336) 349-4544   °[facility] [place]  [employer] [place] [facility] 1) [address], [place] °2) [address], [place] °3)  [address], [place] (336) 349-3220 °(336) 342-7768 ° °(336) 342-8140   °[place] [facility] (336) 342-1394 or (336) 342-3537 (After Hours)    ° ° ° °

## 2014-06-12 NOTE — ED Notes (Signed)
Pt refused to leave states "You all have done nothing for me..you don't even know what my PBC is..." Pt also states " Give me that thing I can blow into so you will know my PBC." Pt informed that there are no available beds for detox and continues to refuse to leave. MD at bedside with pt. Pt told security will have to be called if he continues refusing to leave. Pt states " I will leave but I am going to readmit myself." Second bottle of alcohol found in pt pocket upon discharge. Charge nurse and MD aware.

## 2014-06-12 NOTE — ED Notes (Signed)
Pt noted drinking a bottle of Vodka. Bottle was close to empty when noticed. Pt also had bottle of Gatorade with unknown liquid. Both bottles discarded in the trash can. MD aware.
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
## 2014-06-12 NOTE — ED Notes (Signed)
Bed: [facility] Expected date:  Expected time:  Means of arrival:  Comments: 

## 2014-06-13 ENCOUNTER — Encounter (HOSPITAL_COMMUNITY): Payer: Self-pay | Admitting: *Deleted

## 2014-06-13 ENCOUNTER — Emergency Department (HOSPITAL_COMMUNITY)
Admission: EM | Admit: 2014-06-13 | Discharge: 2014-06-15 | Disposition: A | Discharge status: Other Acute Inpatient Hospital | Payer: No Typology Code available for payment source | Attending: Emergency Medicine | Admitting: Emergency Medicine

## 2014-06-13 DIAGNOSIS — G8929 Other chronic pain: Secondary | ICD-10-CM | POA: Insufficient documentation

## 2014-06-13 DIAGNOSIS — Z79899 Other long term (current) drug therapy: Secondary | ICD-10-CM | POA: Insufficient documentation

## 2014-06-13 DIAGNOSIS — F1024 Alcohol dependence with alcohol-induced mood disorder: Secondary | ICD-10-CM | POA: Insufficient documentation

## 2014-06-13 DIAGNOSIS — Z87891 Personal history of nicotine dependence: Secondary | ICD-10-CM | POA: Insufficient documentation

## 2014-06-13 DIAGNOSIS — K219 Gastro-esophageal reflux disease without esophagitis: Secondary | ICD-10-CM | POA: Insufficient documentation

## 2014-06-13 DIAGNOSIS — M549 Dorsalgia, unspecified: Secondary | ICD-10-CM | POA: Insufficient documentation

## 2014-06-13 DIAGNOSIS — I10 Essential (primary) hypertension: Secondary | ICD-10-CM | POA: Insufficient documentation

## 2014-06-13 DIAGNOSIS — F1010 Alcohol abuse, uncomplicated: Secondary | ICD-10-CM

## 2014-06-13 DIAGNOSIS — F1994 Other psychoactive substance use, unspecified with psychoactive substance-induced mood disorder: Secondary | ICD-10-CM | POA: Diagnosis present

## 2014-06-13 DIAGNOSIS — F10239 Alcohol dependence with withdrawal, unspecified: Secondary | ICD-10-CM | POA: Diagnosis present

## 2014-06-13 LAB — COMPREHENSIVE METABOLIC PANEL
ALBUMIN: 4.3 g/dL (ref 3.5–5.2)
ALT: 32 U/L (ref 0–53)
ANION GAP: 11 (ref 5–15)
AST: 35 U/L (ref 0–37)
Alkaline Phosphatase: 77 U/L (ref 39–117)
BUN: 7 mg/dL (ref 6–23)
CHLORIDE: 109 meq/L (ref 96–112)
CO2: 26 mmol/L (ref 19–32)
CREATININE: 0.8 mg/dL (ref 0.50–1.35)
Calcium: 8.5 mg/dL (ref 8.4–10.5)
GLUCOSE: 139 mg/dL — AB (ref 70–99)
POTASSIUM: 3.9 mmol/L (ref 3.5–5.1)
Sodium: 146 mmol/L — ABNORMAL HIGH (ref 135–145)
Total Bilirubin: 0.5 mg/dL (ref 0.3–1.2)
Total Protein: 7.3 g/dL (ref 6.0–8.3)

## 2014-06-13 LAB — RAPID URINE DRUG SCREEN, HOSP PERFORMED
AMPHETAMINES: NOT DETECTED
Barbiturates: NOT DETECTED
Benzodiazepines: NOT DETECTED
Cocaine: NOT DETECTED
Opiates: NOT DETECTED
Tetrahydrocannabinol: NOT DETECTED

## 2014-06-13 LAB — CBC
HCT: 44.7 % (ref 39.0–52.0)
Hemoglobin: 14.9 g/dL (ref 13.0–17.0)
MCH: 31 pg (ref 26.0–34.0)
MCHC: 33.3 g/dL (ref 30.0–36.0)
MCV: 92.9 fL (ref 78.0–100.0)
Platelets: 167 10*3/uL (ref 150–400)
RBC: 4.81 MIL/uL (ref 4.22–5.81)
RDW: 13.4 % (ref 11.5–15.5)
WBC: 8.1 10*3/uL (ref 4.0–10.5)

## 2014-06-13 LAB — SALICYLATE LEVEL: Salicylate Lvl: <4 mg/dL (ref 2.8–20.0)

## 2014-06-13 LAB — ETHANOL: Alcohol, Ethyl (B): 376 mg/dL — ABNORMAL HIGH (ref 0–9)

## 2014-06-13 LAB — ACETAMINOPHEN LEVEL

## 2014-06-13 MED ORDER — ONDANSETRON HCL 4 MG PO TABS
4.0000 mg | ORAL_TABLET | Freq: Three times a day (TID) | ORAL | Status: DC | PRN
  Administered 2014-06-14 (×2): 4 mg via ORAL
  Filled 2014-06-13 (×2): qty 1

## 2014-06-13 MED ORDER — LORAZEPAM 1 MG PO TABS
0.0000 mg | ORAL_TABLET | Freq: Four times a day (QID) | ORAL | Status: AC
  Administered 2014-06-14 – 2014-06-15 (×8): 1 mg via ORAL
  Filled 2014-06-13 (×8): qty 1

## 2014-06-13 MED ORDER — IBUPROFEN 200 MG PO TABS
600.0000 mg | ORAL_TABLET | Freq: Three times a day (TID) | ORAL | Status: DC | PRN
  Administered 2014-06-14 – 2014-06-15 (×3): 600 mg via ORAL
  Filled 2014-06-13 (×3): qty 3

## 2014-06-13 MED ORDER — LORAZEPAM 1 MG PO TABS: 0.0000 mg | ORAL_TABLET | Freq: Two times a day (BID) | ORAL | Status: DC

## 2014-06-13 NOTE — ED Notes (Signed)
PT states that he is having depression, suicidal thoughts and ETOH abuse; pt states that he has been more depressed over the last 2 weeks; pt states that he has drank a fifth of vodka today and yesterday; pt states that he was going to jump off [place] to end his life; pt reports that "a lot has changed in the last 2 weeks" and that has what has caused his increased depression, increased ETOH use and SI
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
## 2014-06-14 DIAGNOSIS — F1020 Alcohol dependence, uncomplicated: Secondary | ICD-10-CM

## 2014-06-14 DIAGNOSIS — F1994 Other psychoactive substance use, unspecified with psychoactive substance-induced mood disorder: Secondary | ICD-10-CM | POA: Diagnosis present

## 2014-06-14 DIAGNOSIS — F322 Major depressive disorder, single episode, severe without psychotic features: Secondary | ICD-10-CM | POA: Insufficient documentation

## 2014-06-14 DIAGNOSIS — F332 Major depressive disorder, recurrent severe without psychotic features: Secondary | ICD-10-CM

## 2014-06-14 NOTE — BH Assessment (Signed)
Binnie Rail, [facility] at [facility], confirmed adult unit is currently at capacity. Contacted the following facilities for placement:  BED AVAILABLE, PT UNDER REVIEW: [facility], per [facility], per [facility]  AT CAPACITY: [facility], per [facility], per [facility], per [facility], per [facility], per [facility], per [facility], per [facility], per [facility], per [facility], per [facility], per [facility], per [facility], per [facility] RESPONSE: [facility]   [address] [place], [place], [facility] Triage Specialist [phone]
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
## 2014-06-14 NOTE — BH Assessment (Signed)
[facility] Assessment Progress Note  The following attempts have been made to place this pt with results as noted:  *[place]: at capacity  *[place]: at capacity  *[place]: at capacity  [facility]: beds are available, referral faxed with decision pending  Doylene Canning, MA  Triage Specialist  06/14/2014 @ 16:41

## 2014-06-14 NOTE — [facility] Counselor (Signed)
TTS Counselor spoke with pt's RN at [facility], [place], and informed her of disposition.  [facility]ord in TTS has already faxed referrals for pt to multiple hospitals in [place].  TTS Counselor will inform pt of disposition in person if he is awake before end of shift at 7am.     Cyndie Mull, MS, [facility] Triage Specialist - [facility]

## 2014-06-14 NOTE — BH Assessment (Signed)
Brent Gay, AC at [facility], confirms adult unit is currently at capacity. Contacted the following facilities for placement:  BED AVAILABLE, FAXED CLINICAL INFORMATION: [facility], per [facility], per [facility], per [facility], per Donna [facility], per [facility], per Hannah [facility], per Lacey  AT CAPACITY: [facility], per [facility], per [facility], per [facility], per Bill [facility], per [facility], per Sharon [facility], per [facility], per [facility], per G[facility], per Barbara  NO RESPONSE: [facility]  PT DECLINED: [facility]  Brent Gay [name], LPC, N[facility] Triage Specialist [phone] 
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
## 2014-06-14 NOTE — [facility]HH Counselor (Signed)
TTS Counselor completed assessment with pt in his [facility] room.   Per [name], NP, pt meets criteria for [facility] but there are currently no [facility] beds available for detox/mood disorder. Therefore, TTS will seek placement elsewhere for pt.   TTS Counselor also informed [name], NP that pt is concerned that he was beginning to suffer from withdrawals, such as nausea and chills, so NP Catha Nottingham) contacted [facility] nursing staff to implement a protocol to help ease the pt's withdrawal/detox.     Cyndie Mull, [facility] Triage Specialist

## 2014-06-14 NOTE — [facility]HH Counselor (Addendum)
TTS Counselor informed pt's RN, Rashell, that she would be attempting pt's behavioral health assessment shortly. Counselor reviewed pt's past notes, assessments, and labs in preparation for assessment. Pt was cooperative and motivated for treatment.    Cyndie Mull, [facility] Triage Specialist  [facility]

## 2014-06-15 ENCOUNTER — Encounter (HOSPITAL_COMMUNITY): Payer: Self-pay

## 2014-06-15 ENCOUNTER — Observation Stay (HOSPITAL_COMMUNITY)
Admission: AD | Admit: 2014-06-15 | Discharge: 2014-06-16 | Disposition: A | Discharge status: Home / Self Care | Payer: No Typology Code available for payment source | Source: Intra-hospital | Attending: Psychiatry | Admitting: Psychiatry

## 2014-06-15 DIAGNOSIS — Y908 Blood alcohol level of 240 mg/100 ml or more: Secondary | ICD-10-CM | POA: Insufficient documentation

## 2014-06-15 DIAGNOSIS — F10239 Alcohol dependence with withdrawal, unspecified: Secondary | ICD-10-CM | POA: Diagnosis present

## 2014-06-15 DIAGNOSIS — F329 Major depressive disorder, single episode, unspecified: Secondary | ICD-10-CM | POA: Insufficient documentation

## 2014-06-15 DIAGNOSIS — G8929 Other chronic pain: Secondary | ICD-10-CM | POA: Insufficient documentation

## 2014-06-15 DIAGNOSIS — K219 Gastro-esophageal reflux disease without esophagitis: Secondary | ICD-10-CM | POA: Insufficient documentation

## 2014-06-15 DIAGNOSIS — M549 Dorsalgia, unspecified: Secondary | ICD-10-CM | POA: Insufficient documentation

## 2014-06-15 DIAGNOSIS — F10230 Alcohol dependence with withdrawal, uncomplicated: Secondary | ICD-10-CM

## 2014-06-15 DIAGNOSIS — Y909 Presence of alcohol in blood, level not specified: Secondary | ICD-10-CM

## 2014-06-15 DIAGNOSIS — F10939 Alcohol use, unspecified with withdrawal, unspecified: Secondary | ICD-10-CM | POA: Diagnosis present

## 2014-06-15 DIAGNOSIS — I10 Essential (primary) hypertension: Secondary | ICD-10-CM | POA: Insufficient documentation

## 2014-06-15 DIAGNOSIS — F1914 Other psychoactive substance abuse with psychoactive substance-induced mood disorder: Secondary | ICD-10-CM | POA: Insufficient documentation

## 2014-06-15 MED ORDER — METOPROLOL TARTRATE 25 MG PO TABS
12.5000 mg | ORAL_TABLET | Freq: Two times a day (BID) | ORAL | Status: DC
  Administered 2014-06-15 – 2014-06-16 (×3): 12.5 mg via ORAL
  Filled 2014-06-15 (×7): qty 1

## 2014-06-15 MED ORDER — TRAZODONE HCL 50 MG PO TABS
50.0000 mg | ORAL_TABLET | Freq: Every evening | ORAL | Status: DC | PRN
  Administered 2014-06-15 (×2): 50 mg via ORAL
  Filled 2014-06-15 (×6): qty 1

## 2014-06-15 MED ORDER — PANTOPRAZOLE SODIUM 40 MG PO TBEC
40.0000 mg | DELAYED_RELEASE_TABLET | Freq: Every day | ORAL | Status: DC
  Administered 2014-06-16: 40 mg via ORAL
  Filled 2014-06-15 (×3): qty 1

## 2014-06-15 MED ORDER — HYDROXYZINE HCL 25 MG PO TABS
25.0000 mg | ORAL_TABLET | Freq: Four times a day (QID) | ORAL | Status: DC | PRN
  Administered 2014-06-15 – 2014-06-16 (×2): 25 mg via ORAL
  Filled 2014-06-15 (×2): qty 1

## 2014-06-15 MED ORDER — ADULT MULTIVITAMIN W/MINERALS CH
1.0000 | ORAL_TABLET | Freq: Every day | ORAL | Status: DC
  Administered 2014-06-16: 1 via ORAL
  Filled 2014-06-15 (×3): qty 1

## 2014-06-15 MED ORDER — LOPERAMIDE HCL 2 MG PO CAPS: 2.0000 mg | ORAL_CAPSULE | ORAL | Status: DC | PRN

## 2014-06-15 MED ORDER — MAGNESIUM HYDROXIDE 400 MG/5ML PO SUSP: 30.0000 mL | Freq: Every day | ORAL | Status: DC | PRN

## 2014-06-15 MED ORDER — ONDANSETRON 4 MG PO TBDP: 4.0000 mg | ORAL_TABLET | Freq: Four times a day (QID) | ORAL | Status: DC | PRN

## 2014-06-15 MED ORDER — CHLORDIAZEPOXIDE HCL 25 MG PO CAPS: 25.0000 mg | ORAL_CAPSULE | Freq: Four times a day (QID) | ORAL | Status: DC | PRN

## 2014-06-15 MED ORDER — VITAMIN B-1 100 MG PO TABS
100.0000 mg | ORAL_TABLET | Freq: Every day | ORAL | Status: DC
  Administered 2014-06-16: 100 mg via ORAL
  Filled 2014-06-15 (×3): qty 1

## 2014-06-15 MED ORDER — ALUM & MAG HYDROXIDE-SIMETH 200-200-20 MG/5ML PO SUSP: 30.0000 mL | ORAL | Status: DC | PRN

## 2014-06-15 NOTE — Consult Note (Signed)
[place] Psychiatry Consult   Reason for Consult:  Depression and alcohol use Referring Physician:  ED physician  Brent Gay is an 49 y.o. male. Total Time spent with patient: 30 minutes  Assessment: AXIS I:  Alcohol dependence with uncomplicated withdrawal; substance induced mood disorder. AXIS II:  Deferred AXIS III:   Past Medical History  Diagnosis Date  . Hypertension   . Chronic back pain   . Alcohol abuse   . GERD (gastroesophageal reflux disease)   . Gastritis   . Atypical chest pain   . Chronic pain   . Alcoholic liver disease   . Depression    AXIS IV:  other psychosocial or environmental problems and problems with access to health care services AXIS V:  51-60; moderate symptoms  Plan:  Recommend [facility] observation unit or one more day of detox to discharge.  Subjective:   Brent Gay is a 49 y.o. male patient admitted with alcohol detox.  HPI:  The patient reports being "better", denies suicidal/homicidal ideations, hallucinations, and recent drug abuse.  The patient is content watching television in his room and does not appear to be having any withdrawal symptoms but requests Ativan frequently.  His last drink was two days ago.  Appetite has improved and no signs or symptoms of depression.  No past suicide attempts.  Past Psychiatric History: Past Medical History  Diagnosis Date  . Hypertension   . Chronic back pain   . Alcohol abuse   . GERD (gastroesophageal reflux disease)   . Gastritis   . Atypical chest pain   . Chronic pain   . Alcoholic liver disease   . Depression     reports that he quit smoking about 2 years ago. His smoking use included Cigarettes. He has a .5 pack-year smoking history. He has quit using smokeless tobacco. He reports that he drinks about 18.0 oz of alcohol per week. He reports that he uses illicit drugs (Cocaine). Family History  Problem Relation Age of Onset  . Diabetes Mellitus II Father   . Alcohol abuse Father     Family History Substance Abuse: No Family Supports: Yes, List: ("entire family loves and supports me") Living Arrangements: Spouse/significant other, Children (Girlfriend and her 76 y.o. son) Can pt return to current living arrangement?: No (Not unless pt stops drinking)   Allergies:   Allergies  Allergen Reactions  . Acetaminophen     " liver damage"  . Citalopram Swelling    Feet swelling    ACT Assessment Complete:  Yes:    Educational Status    Risk to Self: Risk to self with the past 6 months Suicidal Ideation: Yes-Currently Present Suicidal Intent: No-Not Currently/Within Last 6 Months Is patient at risk for suicide?: Yes Suicidal Plan?: Yes-Currently Present Specify Current Suicidal Plan: OD on pills, jump off bridge, alcohol, etc Access to Means: Yes Specify Access to Suicidal Means: Pt has pills and access to roads & bridges What has been your use of drugs/alcohol within the last 12 months?: Vodka 2 or 3 times a day, sometimes more Previous Attempts/Gestures: No How many times?: 1 Other Self Harm Risks: Alcoholism, Homelessness Triggers for Past Attempts: None known Intentional Self Injurious Behavior: None Family Suicide History: Unknown Recent stressful life event(s): Conflict (Comment), Loss (Comment), Job Loss (Conflict w/girlfriend, dad's death, no work d/t weather) Persecutory voices/beliefs?: No Depression: Yes Depression Symptoms: Insomnia, Isolating, Fatigue, Guilt, Feeling angry/irritable Substance abuse history and/or treatment for substance abuse?: Yes Suicide prevention information given  to non-admitted patients: Not applicable  Risk to Others: Risk to Others within the past 6 months Homicidal Ideation: No Thoughts of Harm to Others: No Current Homicidal Intent: No Current Homicidal Plan: No Access to Homicidal Means: No Identified Victim: na History of harm to others?: No Assessment of Violence: In distant past Violent Behavior Description:  Fights as a kid; Not violent now Does patient have access to weapons?: No Criminal Charges Pending?: No Does patient have a court date: No  Abuse:    Prior Inpatient Therapy: Prior Inpatient Therapy Prior Inpatient Therapy: Yes Prior Therapy Dates: 2014 Prior Therapy Facilty/Provider(s): [facility] Reason for Treatment: Alcoholism, Depression  Prior Outpatient Therapy: Prior Outpatient Therapy Prior Outpatient Therapy: No Prior Therapy Dates: na Prior Therapy Facilty/Provider(s): na Reason for Treatment: na  Additional Information: Additional Information 1:1 In Past 12 Months?: No CIRT Risk: No Elopement Risk: No Does patient have medical clearance?: No                  Objective: Blood pressure 150/93, pulse 89, temperature 98.4 F (36.9 C), temperature source Oral, resp. rate 16, height 5' 9" (1.753 m), weight 200 lb (90.719 kg), SpO2 100 %.Body mass index is 29.52 kg/(m^2). Results for orders placed or performed during the hospital encounter of 06/13/14 (from the past 72 hour(s))  Acetaminophen level     Status: Abnormal   Collection Time: 06/13/14  8:47 PM  Result Value Ref Range   Acetaminophen (Tylenol), Serum <10.0 (L) 10 - 30 ug/mL    Comment:        THERAPEUTIC CONCENTRATIONS VARY SIGNIFICANTLY. A RANGE OF 10-30 ug/mL MAY BE AN EFFECTIVE CONCENTRATION FOR MANY PATIENTS. HOWEVER, SOME ARE BEST TREATED AT CONCENTRATIONS OUTSIDE THIS RANGE. ACETAMINOPHEN CONCENTRATIONS >150 ug/mL AT 4 HOURS AFTER INGESTION AND >50 ug/mL AT 12 HOURS AFTER INGESTION ARE OFTEN ASSOCIATED WITH TOXIC REACTIONS.   CBC     Status: None   Collection Time: 06/13/14  8:47 PM  Result Value Ref Range   WBC 8.1 4.0 - 10.5 K/uL   RBC 4.81 4.22 - 5.81 MIL/uL   Hemoglobin 14.9 13.0 - 17.0 g/dL   HCT 44.7 39.0 - 52.0 %   MCV 92.9 78.0 - 100.0 fL   MCH 31.0 26.0 - 34.0 pg   MCHC 33.3 30.0 - 36.0 g/dL   RDW 13.4 11.5 - 15.5 %   Platelets 167 150 - 400 K/uL  Comprehensive  metabolic panel     Status: Abnormal   Collection Time: 06/13/14  8:47 PM  Result Value Ref Range   Sodium 146 (H) 135 - 145 mmol/L    Comment: Please note change in reference range. DELTA CHECK NOTED    Potassium 3.9 3.5 - 5.1 mmol/L    Comment: Please note change in reference range.   Chloride 109 96 - 112 mEq/L   CO2 26 19 - 32 mmol/L   Glucose, Bld 139 (H) 70 - 99 mg/dL   BUN 7 6 - 23 mg/dL   Creatinine, Ser 0.80 0.50 - 1.35 mg/dL   Calcium 8.5 8.4 - 10.5 mg/dL   Total Protein 7.3 6.0 - 8.3 g/dL   Albumin 4.3 3.5 - 5.2 g/dL   AST 35 0 - 37 U/L   ALT 32 0 - 53 U/L   Alkaline Phosphatase 77 39 - 117 U/L   Total Bilirubin 0.5 0.3 - 1.2 mg/dL   GFR calc non Af Amer >90 >90 mL/min   GFR calc Af Amer >90 >90 mL/min  Comment: (NOTE) The eGFR has been calculated using the CKD EPI equation. This calculation has not been validated in all clinical situations. eGFR's persistently <90 mL/min signify possible Chronic Kidney Disease.    Anion gap 11 5 - 15  Ethanol (ETOH)     Status: Abnormal   Collection Time: 06/13/14  8:47 PM  Result Value Ref Range   Alcohol, Ethyl (B) 376 (H) 0 - 9 mg/dL    Comment:        LOWEST DETECTABLE LIMIT FOR SERUM ALCOHOL IS 11 mg/dL FOR MEDICAL PURPOSES ONLY   Salicylate level     Status: None   Collection Time: 06/13/14  8:47 PM  Result Value Ref Range   Salicylate Lvl <8.9 2.8 - 20.0 mg/dL  Urine Drug Screen     Status: None   Collection Time: 06/13/14 10:23 PM  Result Value Ref Range   Opiates NONE DETECTED NONE DETECTED   Cocaine NONE DETECTED NONE DETECTED   Benzodiazepines NONE DETECTED NONE DETECTED   Amphetamines NONE DETECTED NONE DETECTED   Tetrahydrocannabinol NONE DETECTED NONE DETECTED   Barbiturates NONE DETECTED NONE DETECTED    Comment:        DRUG SCREEN FOR MEDICAL PURPOSES ONLY.  IF CONFIRMATION IS NEEDED FOR ANY PURPOSE, NOTIFY LAB WITHIN 5 DAYS.        LOWEST DETECTABLE LIMITS FOR URINE DRUG SCREEN Drug Class        Cutoff (ng/mL) Amphetamine      1000 Barbiturate      200 Benzodiazepine   373 Tricyclics       428 Opiates          300 Cocaine          300 THC              50    Labs are reviewed and are pertinent for high alcohol level.  Current Facility-Administered Medications  Medication Dose Route Frequency Provider Last Rate Last Dose  . ibuprofen (ADVIL,MOTRIN) tablet 600 mg  600 mg Oral Q8H PRN Shari A Upstill, PA-C   600 mg at 06/15/14 0945  . LORazepam (ATIVAN) tablet 0-4 mg  0-4 mg Oral 4 times per day Dewaine Oats, PA-C   1 mg at 06/15/14 1233   Followed by  . [START ON 06/16/2014] LORazepam (ATIVAN) tablet 0-4 mg  0-4 mg Oral Q12H Shari A Upstill, PA-C      . ondansetron (ZOFRAN) tablet 4 mg  4 mg Oral Q8H PRN Gwen Her Upstill, PA-C   4 mg at 06/14/14 7681   Current Outpatient Prescriptions  Medication Sig Dispense Refill  . metoprolol tartrate (LOPRESSOR) 12.5 mg TABS tablet Take 0.5 tablets (12.5 mg total) by mouth 2 (two) times daily. 30 tablet 3  . Multiple Vitamin (MULTIVITAMIN WITH MINERALS) TABS tablet Take 1 tablet by mouth daily. (Patient not taking: Reported on 06/07/2014)    . omeprazole (PRILOSEC) 20 MG capsule Take 1 capsule (20 mg total) by mouth daily. (Patient not taking: Reported on 06/13/2014) 30 capsule 0  . pantoprazole (PROTONIX) 40 MG tablet Take 1 tablet (40 mg total) by mouth daily. 30 tablet 1  . thiamine 100 MG tablet Take 1 tablet (100 mg total) by mouth daily. 30 tablet 1    Psychiatric Specialty Exam:     Blood pressure 150/93, pulse 89, temperature 98.4 F (36.9 C), temperature source Oral, resp. rate 16, height 5' 9" (1.753 m), weight 200 lb (90.719 kg), SpO2 100 %.Body mass index is  29.52 kg/(m^2).  General Appearance: Casual  Eye Contact::  Good  Speech:  Normal  Volume:  Normal  Mood:  Euthymic  Affect:  Congruent  Thought Process:  Coherent  Orientation:  Full (Time, Place, and Person)  Thought Content:  Clear and coherent  Suicidal  Thoughts:  No  Homicidal Thoughts:  No  Memory:  Good  Judgement:  Fair  Insight:  Fair  Psychomotor Activity:  Normal  Concentration:  Good  Recall:  Good  Fund of Knowledge:  Good  Language:Good  Akathisia:  Negative  Handed:  Right  AIMS (if indicated):     Assets:  Communication Skills Desire for Improvement  Sleep:      Musculoskeletal: Strength & Muscle Tone: within normal limits Gait & Station: normal Patient leans: N/A  Treatment Plan Summary: Admit to [facility] observation unit for stabilization or re-evaluate in the am for discharge as alcohol detox will be completed.  Waylan Boga, [place] 06/15/2014 2:14 PM  I have personally seen the patient and agreed with the findings and involved in the treatment plan. Merian Capron, MD

## 2014-06-15 NOTE — [facility] Counselor (Signed)
Per Berneice Heinrich [facility], pt accepted to bed #3 in OBS unit at [facility]. Pt to be transported after 7:30 per Inetta Fermo. Writer faxed completed consent for treatment form to [facility] and gave original to pt's RN Tanika  Evette Cristal, LCSWA Assessment Counselor

## 2014-06-15 NOTE — Plan of Care (Signed)
[facility] Observation Crisis Plan  Reason for Crisis Plan:  Substance Abuse   Plan of Care:  Referral for Substance Abuse  Family Support:  per pt. tough love    Current Living Environment:  Living Arrangements: Other (Comment) (unsure where he will go presently homeless)  Insurance:   Hospital Account    Name Acct ID Class Status Primary Coverage   Georgi, Navarrete [id number] BEHAVIORAL HEALTH OBSERVATION Open [facility] FOR MH/DD/SAS - [place] [employer]        Guarantor Account (for Hospital Account [id number])    Name Relation to Pt Service Area Active? Acct Type   [name] Yes Behavioral Health   Address Phone       [address]  [place], [place] [address] [phone])          Coverage Information (for Hospital Account [id number])    F/O Payor/Plan Precert #   [facility] FOR MH/DD/SAS/[place] [employer]    Subscriber Subscriber #   Daimon, Kean [id number]   Address Phone   [address] [place], [place] [address] [phone]      Legal Guardian: Pt.    Primary Care Provider:  No PCP Per Patient  Current Outpatient Providers:  NA  Psychiatrist:   NA  Counselor/Therapist:   NA  Compliant with Medications:  Yes  Additional Information:   Cooper Render 1/2/20169:42 PM2

## 2014-06-15 NOTE — Progress Notes (Addendum)
Pt. Presents from Psych ED with a priimary complaint of substance abuse. Pt. Reports he drinks 1/2 to a gallon of vodka daily.  Pt. Has past history of cocaine abuse, stating that was more than 3 years ago.  Pt. 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  Pt. Would like to speak to the MD about being admitted to the unit for longer term treatment.  Pt. Spent 24 hours in the obs unit in Nov. 2015 and received inpatient treatment in July 2015.  Pt. States he is vested this time.  Pt. Has been trying to reach his boss d/t concern over his job.  Pt. Appears most concerned that he has nowhere to live at present time, and or what medications are available to him, requesting ativan before we even complete the admission.  Writer informed pt. He just received the ativan prior to his transport as well as ibuprofen.  Pt. Continues to request medications and has been informed he only has the trazodone for sleep this pm.  .  Pt. Still ask why he cannot have ativan although he had stated earlier that the  Ativan only gave short term relief from his withdrawal symptoms. Hx liver disease, HTN, depression, and atypical chest pain.

## 2014-06-15 NOTE — Progress Notes (Signed)
[facility] INPATIENT:  Family/Significant Other Suicide Prevention Education  Suicide Prevention Education:  Patient Refusal for Family/Significant Other Suicide Prevention Education: The patient Brent Gay has refused to provide written consent for family/significant other to be provided Family/Significant Other Suicide Prevention Education during admission and/or prior to discharge.  Physician notified.  Cooper Render 06/15/2014, 10:15 PM

## 2014-06-16 DIAGNOSIS — F1994 Other psychoactive substance use, unspecified with psychoactive substance-induced mood disorder: Secondary | ICD-10-CM

## 2014-06-16 DIAGNOSIS — F10230 Alcohol dependence with withdrawal, uncomplicated: Secondary | ICD-10-CM

## 2014-06-16 MED ORDER — ADULT MULTIVITAMIN W/MINERALS CH: 1.0000 | ORAL_TABLET | Freq: Every day | ORAL | Status: AC

## 2014-06-16 MED ORDER — CHLORDIAZEPOXIDE HCL 25 MG PO CAPS
25.0000 mg | ORAL_CAPSULE | Freq: Three times a day (TID) | ORAL | Status: DC | PRN
  Administered 2014-06-16: 25 mg via ORAL
  Filled 2014-06-16: qty 1

## 2014-06-16 MED ORDER — THIAMINE HCL 100 MG PO TABS: 100.0000 mg | ORAL_TABLET | Freq: Every day | ORAL | Status: DC

## 2014-06-16 MED ORDER — METOPROLOL TARTRATE 12.5 MG HALF TABLET: 12.5000 mg | ORAL_TABLET | Freq: Two times a day (BID) | ORAL | Status: AC

## 2014-06-16 MED ORDER — OMEPRAZOLE 20 MG PO CPDR: 20.0000 mg | DELAYED_RELEASE_CAPSULE | Freq: Every day | ORAL | Status: AC

## 2014-06-16 MED ORDER — IBUPROFEN 600 MG PO TABS
600.0000 mg | ORAL_TABLET | Freq: Four times a day (QID) | ORAL | Status: DC | PRN
  Administered 2014-06-16: 600 mg via ORAL
  Filled 2014-06-16: qty 1

## 2014-06-16 NOTE — Progress Notes (Signed)
D: Patient verbalizes readiness for discharge: Denies SI/HI, is not psychotic or delusional.   A: Discharge instructions read and discussed with patient. All belongings returned to pt.   R: Pt verbalized understanding of discharge instructions. Signed for return of belongings.   A: Escorted to the lobby.    

## 2014-06-16 NOTE — Progress Notes (Signed)
Pt is awake and requesting motrin for his headache , backache and joint pain. Pt is pleasant and cooperative. He contracts for safety and denies SI and HI. NP made aware pt has a CIWA of 11. Pts boss and GF phoned to check on him. Pt awake and returning the phone calls.

## 2014-06-16 NOTE — Discharge Summary (Signed)
[facility] OBS UNIT DISCHARGE SUMMARY   Brent Gay is an 49 y.o. male. Total Time spent with patient: 45 minutes  Assessment: AXIS I: Alcohol dependence with uncomplicated withdrawal; substance induced mood disorder. AXIS II: Deferred AXIS III:  Past Medical History  Diagnosis Date  . Hypertension   . Chronic back pain   . Alcohol abuse   . GERD (gastroesophageal reflux disease)   . Gastritis   . Atypical chest pain   . Chronic pain   . Alcoholic liver disease   . Depression    AXIS IV: other psychosocial or environmental problems and problems with access to health care services AXIS V: 51-60; moderate symptoms  Plan: Discharge home with outpatient resources for alcoholism and substance abuse.   Subjective:  Brent Gay is a 49 y.o. male patient admitted with alcohol detox. He denies SI, HI, and AVH, contracts for safety. He spent the night in the [place] without incident. His CIWA scores have been 9, and 12 most recently with a hypotensive event 90's/50's with extreme diaphoresis and moderate tremor. However, pt's BP is trending upwards and stabilizing, tremor is now barely evident and no diaphoresis is noted.  Pt stable for discharge. Pt will discharge to home.   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 He currently lives with his girlfriend and the gf's 46 y.o. son. His family and friends brought him to [facility] 3 days ago due to his suicidal ideation with plan, chronic alcohol abuse, and worsening depression over the past 2 weeks. Pt presents with depressed, anxious affect and mood, fair eye-contact, and fair-to-good insight into his alcohol use disorder and depression. His speech is logical and coherent and thought processes are normal with no evidence of delusional content. Pt reports that he believes that some of the triggers for his worsening depression and alcohol consumption in recent weeks  include his father's death in 02/07/2023, relationship problems with his girlfriend and her disapproval of his drinking (and threats to throw him out of the house), and the rainy weather making it impossible for him to work as a Games developer and contribute any income to the household.   The pt has struggled with depression since his late 20's with his girlfriend at the time aborted their baby, which he did not want her to do. In recent weeks, pt has been experiencing social isolation, fatigue, insomnia, increased irritability, guilt, etc. The pt has considered plans of ways to end his life, such as jumping off a bridge, alcohol poisoning, overdosing on pills, etc. However, he said he has not acted on these impulses because he does value his life and is not completely hopeless. Some of the pt's strengths include ability for increased insight, capable of independent-living and completion of ADL's, communication skills, work skills, and a strong social support network.  Patent continues to remain depressed and concern of his alcohol use. Significant recent stressors have added to his increase use of alochol. Somewhat tremulousness noticeable.  Past Psychiatric History: Past Medical History  Diagnosis Date  . Hypertension   . Chronic back pain   . Alcohol abuse   . GERD (gastroesophageal reflux disease)   . Gastritis   . Atypical chest pain   . Chronic pain   . Alcoholic liver disease   . Depression    reports that he quit smoking about 2 years ago. His smoking use included Cigarettes. He has a .5 pack-year smoking history. He  has quit using smokeless tobacco. He reports that he drinks about 18.0 oz of alcohol per week. He reports that he uses illicit drugs (Cocaine). Family History  Problem Relation Age of Onset  . Diabetes Mellitus II Father   . Alcohol abuse Father     Living Arrangements: Other (Comment) (unsure where he will go presently  homeless)  Abuse/Neglect [facility]) Physical Abuse: Denies Verbal Abuse: Denies Sexual Abuse: Denies Allergies:  Allergies  Allergen Reactions  . Acetaminophen     " liver damage"  . Citalopram Swelling    Feet swelling    ACT Assessment Complete: Yes:  Educational Status   Risk to Self:   Risk to Others:   Abuse: Abuse/Neglect Assessment (Assessment to be complete while patient is alone) Physical Abuse: Denies Verbal Abuse: Denies Sexual Abuse: Denies Exploitation of patient/patient's resources: Denies Self-Neglect: Denies  Prior Inpatient Therapy:   Prior Outpatient Therapy:   Additional Information:                   Objective: Blood pressure 93/55, pulse 60, temperature 98 F (36.7 C), temperature source Oral, height '5\' 9"'  (1.753 m), weight 95.709 kg (211 lb).Body mass index is 31.15 kg/(m^2). Results for orders placed or performed during the hospital encounter of 06/13/14 (from the past 72 hour(s))  Acetaminophen level Status: Abnormal   Collection Time: 06/13/14 8:47 PM  Result Value Ref Range   Acetaminophen (Tylenol), Serum <10.0 (L) 10 - 30 ug/mL    Comment:   THERAPEUTIC CONCENTRATIONS VARY SIGNIFICANTLY. A RANGE OF 10-30 ug/mL MAY BE AN EFFECTIVE CONCENTRATION FOR MANY PATIENTS. HOWEVER, SOME ARE BEST TREATED AT CONCENTRATIONS OUTSIDE THIS RANGE. ACETAMINOPHEN CONCENTRATIONS >150 ug/mL AT 4 HOURS AFTER INGESTION AND >50 ug/mL AT 12 HOURS AFTER INGESTION ARE OFTEN ASSOCIATED WITH TOXIC REACTIONS.   CBC Status: None   Collection Time: 06/13/14 8:47 PM  Result Value Ref Range   WBC 8.1 4.0 - 10.5 K/uL   RBC 4.81 4.22 - 5.81 MIL/uL   Hemoglobin 14.9 13.0 - 17.0 g/dL   HCT 44.7 39.0 - 52.0 %   MCV 92.9 78.0 - 100.0 fL   MCH 31.0 26.0 - 34.0 pg   MCHC 33.3 30.0 - 36.0 g/dL   RDW 13.4 11.5 - 15.5 %   Platelets 167 150 - 400  K/uL  Comprehensive metabolic panel Status: Abnormal   Collection Time: 06/13/14 8:47 PM  Result Value Ref Range   Sodium 146 (H) 135 - 145 mmol/L    Comment: Please note change in reference range. DELTA CHECK NOTED    Potassium 3.9 3.5 - 5.1 mmol/L    Comment: Please note change in reference range.   Chloride 109 96 - 112 mEq/L   CO2 26 19 - 32 mmol/L   Glucose, Bld 139 (H) 70 - 99 mg/dL   BUN 7 6 - 23 mg/dL   Creatinine, Ser 0.80 0.50 - 1.35 mg/dL   Calcium 8.5 8.4 - 10.5 mg/dL   Total Protein 7.3 6.0 - 8.3 g/dL   Albumin 4.3 3.5 - 5.2 g/dL   AST 35 0 - 37 U/L   ALT 32 0 - 53 U/L   Alkaline Phosphatase 77 39 - 117 U/L   Total Bilirubin 0.5 0.3 - 1.2 mg/dL   GFR calc non Af Amer >90 >90 mL/min   GFR calc Af Amer >90 >90 mL/min    Comment: (NOTE) The eGFR has been calculated using the CKD EPI equation. This calculation has not been validated in  all clinical situations. eGFR's persistently <90 mL/min signify possible Chronic Kidney Disease.    Anion gap 11 5 - 15  Ethanol (ETOH) Status: Abnormal   Collection Time: 06/13/14 8:47 PM  Result Value Ref Range   Alcohol, Ethyl (B) 376 (H) 0 - 9 mg/dL    Comment:   LOWEST DETECTABLE LIMIT FOR SERUM ALCOHOL IS 11 mg/dL FOR MEDICAL PURPOSES ONLY   Salicylate level Status: None   Collection Time: 06/13/14 8:47 PM  Result Value Ref Range   Salicylate Lvl <7.2 2.8 - 20.0 mg/dL  Urine Drug Screen Status: None   Collection Time: 06/13/14 10:23 PM  Result Value Ref Range   Opiates NONE DETECTED NONE DETECTED   Cocaine NONE DETECTED NONE DETECTED   Benzodiazepines NONE DETECTED NONE DETECTED   Amphetamines NONE DETECTED NONE DETECTED   Tetrahydrocannabinol NONE DETECTED NONE DETECTED   Barbiturates NONE DETECTED NONE DETECTED    Comment:   DRUG SCREEN FOR  MEDICAL PURPOSES ONLY. IF CONFIRMATION IS NEEDED FOR ANY PURPOSE, NOTIFY LAB WITHIN 5 DAYS.   LOWEST DETECTABLE LIMITS FOR URINE DRUG SCREEN Drug Class Cutoff (ng/mL) Amphetamine 1000 Barbiturate 200 Benzodiazepine 536 Tricyclics 644 Opiates 034 Cocaine 300 THC 50    Labs are reviewed and are pertinent for high alcohol level.  Current Facility-Administered Medications  Medication Dose Route Frequency Provider Last Rate Last Dose  . alum & mag hydroxide-simeth (MAALOX/MYLANTA) 200-200-20 MG/5ML suspension 30 mL 30 mL Oral Q4H PRN Dara Hoyer, PA-C    . chlordiazePOXIDE (LIBRIUM) capsule 25 mg 25 mg Oral Q6H PRN Dara Hoyer, PA-C    . hydrOXYzine (ATARAX/VISTARIL) tablet 25 mg 25 mg Oral Q6H PRN Dara Hoyer, PA-C  25 mg at 06/15/14 2336  . loperamide (IMODIUM) capsule 2-4 mg 2-4 mg Oral PRN Dara Hoyer, PA-C    . magnesium hydroxide (MILK OF MAGNESIA) suspension 30 mL 30 mL Oral Daily PRN Dara Hoyer, PA-C    . metoprolol tartrate (LOPRESSOR) tablet 12.5 mg 12.5 mg Oral BID Dara Hoyer, PA-C  12.5 mg at 06/15/14 2321  . multivitamin with minerals tablet 1 tablet 1 tablet Oral Daily Dara Hoyer, PA-C    . ondansetron (ZOFRAN-ODT) disintegrating tablet 4 mg 4 mg Oral Q6H PRN Dara Hoyer, PA-C    . pantoprazole (PROTONIX) EC tablet 40 mg 40 mg Oral Daily Dara Hoyer, PA-C    . thiamine (VITAMIN B-1) tablet 100 mg 100 mg Oral Daily Dara Hoyer, PA-C    . traZODone (DESYREL) tablet 50 mg 50 mg Oral QHS,MR X 1 Dara Hoyer, PA-C  50 mg at 06/15/14 2335    Psychiatric Specialty Exam:     Blood pressure 93/55, pulse 60, temperature 98 F (36.7 C), temperature source Oral, height '5\' 9"'  (1.753 m), weight 95.709 kg (211 lb).Body mass index is 31.15 kg/(m^2).   General Appearance: Casual  Eye Contact:: Good  Speech: Normal  Volume: Normal  Mood: Euthymic  Affect: Congruent  Thought Process: Coherent  Orientation: Full (Time, Place, and Person)  Thought Content: Clear and coherent  Suicidal Thoughts: No  Homicidal Thoughts: No  Memory: Good  Judgement: Fair  Insight: Fair  Psychomotor Activity: Normal  Concentration: Good  Recall: Good  Fund of Knowledge: Good  Language:Good  Akathisia: Negative  Handed: Right  AIMS (if indicated):   Assets: Communication Skills Desire for Improvement  Sleep:    Musculoskeletal: Strength & Muscle Tone: within normal limits Gait & Station: normal Patient leans:  N/A  Treatment Plan Summary: -Discharge home with outpatient resources. Pt to followup with AA meetings per his request.   Ares, Cardozo C,FNP-BC 06/16/2014 4:15 PM   Patient seen, evaluated and I agree with notes by Nurse Practitioner. Corena Pilgrim, MD

## 2014-06-16 NOTE — H&P (Signed)
[facility] OBS UNIT H&P   Brent Gay is an 49 y.o. male. Total Time spent with patient: 45 minutes  Assessment: AXIS I:  Alcohol dependence with uncomplicated withdrawal; substance induced mood disorder. AXIS II:  Deferred AXIS III:   Past Medical History  Diagnosis Date  . Hypertension   . Chronic back pain   . Alcohol abuse   . GERD (gastroesophageal reflux disease)   . Gastritis   . Atypical chest pain   . Chronic pain   . Alcoholic liver disease   . Depression    AXIS IV:  other psychosocial or environmental problems and problems with access to health care services AXIS V:  51-60; moderate symptoms  Plan:  Discharge home with outpatient resources for alcoholism and substance abuse.   Subjective:   Brent Gay is a 49 y.o. male patient admitted with alcohol detox. He denies SI, HI, and AVH, contracts for safety. He spent the night in the [place] without incident. His CIWA scores have been 9, and 12 most recently with a hypotensive event 90's/50's with extreme diaphoresis and moderate tremor. However, pt's BP is trending upwards and stabilizing, tremor is now barely evident and no diaphoresis is noted.  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 He currently lives with his girlfriend and the gf's 44 y.o. son. His family and friends brought him to [facility] 3 days ago due to his suicidal ideation with plan, chronic alcohol abuse, and worsening depression over the past 2 weeks. Pt presents with depressed, anxious affect and mood, fair eye-contact, and fair-to-good insight into his alcohol use disorder and depression. His speech is logical and coherent and thought processes are normal with no evidence of delusional content. 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   The pt has struggled with depression since his late 20's with his girlfriend at the time aborted their baby, which he did not want her to do. In recent weeks, pt has been experiencing social isolation, fatigue, insomnia, increased irritability, guilt, etc. The pt has considered plans of ways to end his life, such as jumping off a bridge, alcohol poisoning, overdosing on pills, etc. However, he said he has not acted on these impulses because he does value his life and is not completely hopeless. Some of the pt's strengths include ability for increased insight, capable of independent-living and completion of ADL's, communication skills, work skills, and a strong social support network.  Patent continues to remain depressed and concern of his alcohol use. Significant recent stressors have added to his increase use of alochol. Somewhat tremulousness noticeable.  Past Psychiatric History: Past Medical History  Diagnosis Date  . Hypertension   . Chronic back pain   . Alcohol abuse   . GERD (gastroesophageal reflux disease)   . Gastritis   . Atypical chest pain   . Chronic pain   . Alcoholic liver disease   . Depression     reports that he quit smoking about 2 years ago. His smoking use included Cigarettes. He has a .5 pack-year smoking history. He has quit using  smokeless tobacco. He reports that he drinks about 18.0 oz of alcohol per week. He reports that he uses illicit drugs (Cocaine). Family History  Problem Relation Age of Onset  . Diabetes Mellitus II Father   . Alcohol abuse Father      Living Arrangements: Other (Comment) (unsure where he will go presently homeless)   Abuse/Neglect [facility]) Physical Abuse: Denies Verbal Abuse: Denies Sexual Abuse: Denies Allergies:   Allergies  Allergen Reactions  .  Acetaminophen     " liver damage"  . Citalopram Swelling    Feet swelling    ACT Assessment Complete:  Yes:    Educational Status    Risk to Self:    Risk to Others:    Abuse: Abuse/Neglect Assessment (Assessment to be complete while patient is alone) Physical Abuse: Denies Verbal Abuse: Denies Sexual Abuse: Denies Exploitation of patient/patient's resources: Denies Self-Neglect: Denies  Prior Inpatient Therapy:    Prior Outpatient Therapy:    Additional Information:                    Objective: Blood pressure 93/55, pulse 60, temperature 98 F (36.7 C), temperature source Oral, height _0  (1.753 m), weight 95.709 kg (211 lb).Body mass index is 31.15 kg/(m^2). Results for orders placed or performed during the hospital encounter of 06/13/14 (from the past 72 hour(s))  Acetaminophen level     Status: Abnormal   Collection Time: 06/13/14  8:47 PM  Result Value Ref Range   Acetaminophen (Tylenol), Serum <10.0 (L) 10 - 30 ug/mL    Comment:        THERAPEUTIC CONCENTRATIONS VARY SIGNIFICANTLY. A RANGE OF 10-30 ug/mL MAY BE AN EFFECTIVE CONCENTRATION FOR MANY PATIENTS. HOWEVER, SOME ARE BEST TREATED AT CONCENTRATIONS OUTSIDE THIS RANGE. ACETAMINOPHEN CONCENTRATIONS >150 ug/mL AT 4 HOURS AFTER INGESTION AND >50 ug/mL AT 12 HOURS AFTER INGESTION ARE OFTEN ASSOCIATED WITH TOXIC REACTIONS.   CBC     Status: None   Collection Time: 06/13/14  8:47 PM  Result Value Ref Range   WBC 8.1 4.0 - 10.5 K/uL   RBC 4.81 4.22 - 5.81 MIL/uL   Hemoglobin 14.9 13.0 - 17.0 g/dL   HCT 44.7 39.0 - 52.0 %   MCV 92.9 78.0 - 100.0 fL   MCH 31.0 26.0 - 34.0 pg   MCHC 33.3 30.0 - 36.0 g/dL   RDW 13.4 11.5 - 15.5 %   Platelets 167 150 - 400 K/uL  Comprehensive metabolic panel     Status: Abnormal   Collection Time: 06/13/14  8:47 PM  Result Value Ref Range   Sodium 146 (H) 135 - 145 mmol/L    Comment: Please note change in reference range. DELTA CHECK NOTED    Potassium  3.9 3.5 - 5.1 mmol/L    Comment: Please note change in reference range.   Chloride 109 96 - 112 mEq/L   CO2 26 19 - 32 mmol/L   Glucose, Bld 139 (H) 70 - 99 mg/dL   BUN 7 6 - 23 mg/dL   Creatinine, Ser 0.80 0.50 - 1.35 mg/dL   Calcium 8.5 8.4 - 10.5 mg/dL   Total Protein 7.3 6.0 - 8.3 g/dL   Albumin 4.3 3.5 - 5.2 g/dL   AST 35 0 - 37 U/L   ALT 32 0 - 53 U/L   Alkaline Phosphatase 77 39 - 117 U/L   Total Bilirubin 0.5 0.3 - 1.2 mg/dL   GFR calc non Af Amer >90 >90 mL/min  GFR calc Af Amer >90 >90 mL/min    Comment: (NOTE) The eGFR has been calculated using the CKD EPI equation. This calculation has not been validated in all clinical situations. eGFR's persistently <90 mL/min signify possible Chronic Kidney Disease.    Anion gap 11 5 - 15  Ethanol (ETOH)     Status: Abnormal   Collection Time: 06/13/14  8:47 PM  Result Value Ref Range   Alcohol, Ethyl (B) 376 (H) 0 - 9 mg/dL    Comment:        LOWEST DETECTABLE LIMIT FOR SERUM ALCOHOL IS 11 mg/dL FOR MEDICAL PURPOSES ONLY   Salicylate level     Status: None   Collection Time: 06/13/14  8:47 PM  Result Value Ref Range   Salicylate Lvl <3.5 2.8 - 20.0 mg/dL  Urine Drug Screen     Status: None   Collection Time: 06/13/14 10:23 PM  Result Value Ref Range   Opiates NONE DETECTED NONE DETECTED   Cocaine NONE DETECTED NONE DETECTED   Benzodiazepines NONE DETECTED NONE DETECTED   Amphetamines NONE DETECTED NONE DETECTED   Tetrahydrocannabinol NONE DETECTED NONE DETECTED   Barbiturates NONE DETECTED NONE DETECTED    Comment:        DRUG SCREEN FOR MEDICAL PURPOSES ONLY.  IF CONFIRMATION IS NEEDED FOR ANY PURPOSE, NOTIFY LAB WITHIN 5 DAYS.        LOWEST DETECTABLE LIMITS FOR URINE DRUG SCREEN Drug Class       Cutoff (ng/mL) Amphetamine      1000 Barbiturate      200 Benzodiazepine   009 Tricyclics       381 Opiates          300 Cocaine          300 THC              50    Labs are reviewed and are pertinent for high  alcohol level.  Current Facility-Administered Medications  Medication Dose Route Frequency Provider Last Rate Last Dose  . alum & mag hydroxide-simeth (MAALOX/MYLANTA) 200-200-20 MG/5ML suspension 30 mL  30 mL Oral Q4H PRN Dara Hoyer, PA-C      . chlordiazePOXIDE (LIBRIUM) capsule 25 mg  25 mg Oral Q6H PRN Dara Hoyer, PA-C      . hydrOXYzine (ATARAX/VISTARIL) tablet 25 mg  25 mg Oral Q6H PRN Dara Hoyer, PA-C   25 mg at 06/15/14 2336  . loperamide (IMODIUM) capsule 2-4 mg  2-4 mg Oral PRN Dara Hoyer, PA-C      . magnesium hydroxide (MILK OF MAGNESIA) suspension 30 mL  30 mL Oral Daily PRN Dara Hoyer, PA-C      . metoprolol tartrate (LOPRESSOR) tablet 12.5 mg  12.5 mg Oral BID Dara Hoyer, PA-C   12.5 mg at 06/15/14 2321  . multivitamin with minerals tablet 1 tablet  1 tablet Oral Daily Dara Hoyer, PA-C      . ondansetron (ZOFRAN-ODT) disintegrating tablet 4 mg  4 mg Oral Q6H PRN Dara Hoyer, PA-C      . pantoprazole (PROTONIX) EC tablet 40 mg  40 mg Oral Daily Dara Hoyer, PA-C      . thiamine (VITAMIN B-1) tablet 100 mg  100 mg Oral Daily Dara Hoyer, PA-C      . traZODone (DESYREL) tablet 50 mg  50 mg Oral QHS,MR X 1 Dara Hoyer, PA-C   50 mg at 06/15/14 2335  Psychiatric Specialty Exam:     Blood pressure 93/55, pulse 60, temperature 98 F (36.7 C), temperature source Oral, height _0  (1.753 m), weight 95.709 kg (211 lb).Body mass index is 31.15 kg/(m^2).  General Appearance: Casual  Eye Contact::  Good  Speech:  Normal  Volume:  Normal  Mood:  Euthymic  Affect:  Congruent  Thought Process:  Coherent  Orientation:  Full (Time, Place, and Person)  Thought Content:  Clear and coherent  Suicidal Thoughts:  No  Homicidal Thoughts:  No  Memory:  Good  Judgement:  Fair  Insight:  Fair  Psychomotor Activity:  Normal  Concentration:  Good  Recall:  Good  Fund of Knowledge:  Good  Language:Good  Akathisia:  Negative  Handed:   Right  AIMS (if indicated):     Assets:  Communication Skills Desire for Improvement  Sleep:      Musculoskeletal: Strength & Muscle Tone: within normal limits Gait & Station: normal Patient leans: N/A  Treatment Plan Summary: -Discharge home with outpatient resources as long as pt's symptoms are stable.   Xhaiden, Coombs [facility] 06/16/2014 8:22 AM    Patient seen, evaluated and I agree with notes by Nurse Practitioner. Corena Pilgrim, MD

## 2014-08-04 ENCOUNTER — Encounter (HOSPITAL_COMMUNITY): Payer: Self-pay | Admitting: Emergency Medicine

## 2014-08-04 ENCOUNTER — Emergency Department (HOSPITAL_COMMUNITY)
Admission: EM | Admit: 2014-08-04 | Discharge: 2014-08-05 | Disposition: A | Discharge status: Home / Self Care | Payer: No Typology Code available for payment source | Attending: Emergency Medicine | Admitting: Emergency Medicine

## 2014-08-04 ENCOUNTER — Emergency Department (HOSPITAL_COMMUNITY): Payer: Self-pay

## 2014-08-04 DIAGNOSIS — Z8719 Personal history of other diseases of the digestive system: Secondary | ICD-10-CM | POA: Insufficient documentation

## 2014-08-04 DIAGNOSIS — F1994 Other psychoactive substance use, unspecified with psychoactive substance-induced mood disorder: Secondary | ICD-10-CM | POA: Diagnosis present

## 2014-08-04 DIAGNOSIS — G8929 Other chronic pain: Secondary | ICD-10-CM | POA: Insufficient documentation

## 2014-08-04 DIAGNOSIS — Z87891 Personal history of nicotine dependence: Secondary | ICD-10-CM | POA: Insufficient documentation

## 2014-08-04 DIAGNOSIS — I10 Essential (primary) hypertension: Secondary | ICD-10-CM | POA: Insufficient documentation

## 2014-08-04 DIAGNOSIS — Z79899 Other long term (current) drug therapy: Secondary | ICD-10-CM | POA: Insufficient documentation

## 2014-08-04 DIAGNOSIS — R45851 Suicidal ideations: Secondary | ICD-10-CM

## 2014-08-04 DIAGNOSIS — F10230 Alcohol dependence with withdrawal, uncomplicated: Secondary | ICD-10-CM | POA: Diagnosis present

## 2014-08-04 DIAGNOSIS — F1024 Alcohol dependence with alcohol-induced mood disorder: Secondary | ICD-10-CM | POA: Insufficient documentation

## 2014-08-04 DIAGNOSIS — F1020 Alcohol dependence, uncomplicated: Secondary | ICD-10-CM | POA: Diagnosis present

## 2014-08-04 LAB — CBC
HCT: 41.5 % (ref 39.0–52.0)
Hemoglobin: 14.6 g/dL (ref 13.0–17.0)
MCH: 31.5 pg (ref 26.0–34.0)
MCHC: 35.2 g/dL (ref 30.0–36.0)
MCV: 89.4 fL (ref 78.0–100.0)
PLATELETS: 162 10*3/uL (ref 150–400)
RBC: 4.64 MIL/uL (ref 4.22–5.81)
RDW: 12.1 % (ref 11.5–15.5)
WBC: 6.1 10*3/uL (ref 4.0–10.5)

## 2014-08-04 LAB — LIPASE, BLOOD: Lipase: 23 U/L (ref 11–59)

## 2014-08-04 LAB — SALICYLATE LEVEL: Salicylate Lvl: <4 mg/dL (ref 2.8–20.0)

## 2014-08-04 LAB — RAPID URINE DRUG SCREEN, HOSP PERFORMED
Amphetamines: NOT DETECTED
Barbiturates: NOT DETECTED
Benzodiazepines: NOT DETECTED
Cocaine: NOT DETECTED
Opiates: NOT DETECTED
Tetrahydrocannabinol: NOT DETECTED

## 2014-08-04 LAB — COMPREHENSIVE METABOLIC PANEL
ALK PHOS: 67 U/L (ref 39–117)
ALT: 23 U/L (ref 0–53)
ANION GAP: 12 (ref 5–15)
AST: 27 U/L (ref 0–37)
Albumin: 4.1 g/dL (ref 3.5–5.2)
BILIRUBIN TOTAL: 0.9 mg/dL (ref 0.3–1.2)
BUN: 7 mg/dL (ref 6–23)
CO2: 21 mmol/L (ref 19–32)
CREATININE: 0.73 mg/dL (ref 0.50–1.35)
Calcium: 8.8 mg/dL (ref 8.4–10.5)
Chloride: 104 mmol/L (ref 96–112)
GFR calc Af Amer: >90 mL/min (ref >90)
GFR calc non Af Amer: >90 mL/min (ref >90)
GLUCOSE: 97 mg/dL (ref 70–99)
POTASSIUM: 3.8 mmol/L (ref 3.5–5.1)
Sodium: 137 mmol/L (ref 135–145)
Total Protein: 6.9 g/dL (ref 6.0–8.3)

## 2014-08-04 LAB — ACETAMINOPHEN LEVEL

## 2014-08-04 LAB — I-STAT TROPONIN, ED: Troponin i, poc: 0 ng/mL (ref 0.00–0.08)

## 2014-08-04 LAB — ETHANOL: ALCOHOL ETHYL (B): 43 mg/dL — AB (ref 0–9)

## 2014-08-04 MED ORDER — LORAZEPAM 1 MG PO TABS: 0.0000 mg | ORAL_TABLET | Freq: Two times a day (BID) | ORAL | Status: DC

## 2014-08-04 MED ORDER — HYDROXYZINE HCL 25 MG PO TABS
50.0000 mg | ORAL_TABLET | Freq: Three times a day (TID) | ORAL | Status: DC | PRN
  Administered 2014-08-04 – 2014-08-05 (×3): 50 mg via ORAL
  Filled 2014-08-04 (×3): qty 2

## 2014-08-04 MED ORDER — LORAZEPAM 2 MG/ML IJ SOLN
2.0000 mg | Freq: Once | INTRAMUSCULAR | Status: AC
  Administered 2014-08-04: 2 mg via INTRAVENOUS
  Filled 2014-08-04: qty 1

## 2014-08-04 MED ORDER — ALUM & MAG HYDROXIDE-SIMETH 200-200-20 MG/5ML PO SUSP: 30.0000 mL | ORAL | Status: DC | PRN

## 2014-08-04 MED ORDER — ONDANSETRON HCL 4 MG PO TABS
4.0000 mg | ORAL_TABLET | Freq: Three times a day (TID) | ORAL | Status: DC | PRN
  Administered 2014-08-04: 4 mg via ORAL
  Filled 2014-08-04: qty 1

## 2014-08-04 MED ORDER — IBUPROFEN 200 MG PO TABS
600.0000 mg | ORAL_TABLET | Freq: Three times a day (TID) | ORAL | Status: DC | PRN
  Filled 2014-08-04: qty 3

## 2014-08-04 MED ORDER — NICOTINE 21 MG/24HR TD PT24: 21.0000 mg | MEDICATED_PATCH | Freq: Every day | TRANSDERMAL | Status: DC

## 2014-08-04 MED ORDER — LORAZEPAM 1 MG PO TABS: 0.0000 mg | ORAL_TABLET | Freq: Four times a day (QID) | ORAL | Status: DC

## 2014-08-04 NOTE — ED Notes (Signed)
TTS at bedside. 

## 2014-08-04 NOTE — ED Notes (Signed)
Report given to [facility]. Pt to go to [facility]

## 2014-08-04 NOTE — ED Notes (Signed)
Pt wanded by security. 

## 2014-08-04 NOTE — ED Notes (Signed)
MD Allen at bedside.
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
## 2014-08-04 NOTE — ED Notes (Signed)
Nurse drawing labs. 
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
## 2014-08-04 NOTE — ED Notes (Addendum)
[facility] c/o centralized chest pain since last pm. He reports the pain is aching and pressure. He also reports nausea and "withdrawal from alcohol". Last drink  Was last p.m.. He reports he normal drinks a half gallon on liqueur a day. Pt reports suicidal ideations but denies homicidal ideations.  20G Iv left hand and 4 mg of Zofran given en route.

## 2014-08-04 NOTE — ED Notes (Signed)
Radiology at bedside

## 2014-08-04 NOTE — ED Notes (Signed)
Bed: WA06 Expected date: 08/04/14 Expected time: 12:27 PM Means of arrival: Ambulance Comments: abd pain

## 2014-08-05 DIAGNOSIS — F10230 Alcohol dependence with withdrawal, uncomplicated: Secondary | ICD-10-CM | POA: Diagnosis present

## 2014-08-05 DIAGNOSIS — R45851 Suicidal ideations: Secondary | ICD-10-CM

## 2014-08-05 DIAGNOSIS — F1994 Other psychoactive substance use, unspecified with psychoactive substance-induced mood disorder: Secondary | ICD-10-CM

## 2014-08-05 MED ORDER — HYDROXYZINE HCL 25 MG PO TABS: 25.0000 mg | ORAL_TABLET | Freq: Four times a day (QID) | ORAL | Status: DC | PRN

## 2014-08-05 MED ORDER — HYDROXYZINE HCL 25 MG PO TABS
25.0000 mg | ORAL_TABLET | Freq: Four times a day (QID) | ORAL | Status: DC | PRN
Start: 2014-08-05 — End: 2014-08-05

## 2014-08-05 MED ORDER — LOPERAMIDE HCL 2 MG PO CAPS
4.0000 mg | ORAL_CAPSULE | Freq: Once | ORAL | Status: AC
  Administered 2014-08-05: 4 mg via ORAL
  Filled 2014-08-05: qty 2
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
## 2014-08-05 NOTE — BH Assessment (Signed)
[facility] Assessment Progress Note  Per Thedore MinsMojeed Akintayo, MD this pt does not require psychiatric hospitalization at this time. He is to be discharged with referral information for area substance abuse treatment providers. This information has been included in pt's discharge instructions. Pt's nurse, Minerva Areolaric, has been notified.  Brent Canninghomas Lashya Passe, MA Triage Specialist 08/05/2014 @ 10:43

## 2014-08-05 NOTE — ED Notes (Signed)
Patient seen awake watching TV. Patient requested Ativan as soon this writer stepped into his room. Patient stated "Can I have my Ativan now?, I think the Dr said I can get it now. I'm shaky- stretching out his arm for the writer so see. I have diarrhea - gone many times, I feel nauseated". Patient was made aware that the Dr discontinued Ativan and ordered Vistaril for him. He endorses SI with plan to hurt self, AH/VH. Voices telling him to hurt self and seeing things he cannot explain. Patient continue to demand food and drinks at all time. Safety maintained. Will continue to monitor patient.

## 2014-08-05 NOTE — Discharge Instructions (Signed)
To help you maintain a sober lifestyle, a substance abuse treatment program may be helpful to you.  Contact the following treatment providers to see about enrolling in their programs: ° °RESIDENTIAL: ° °     [facility] °     [address] °     [place], [place] [address] °     [phone] ° °     [facility] °     [address] °     [place], [place] [address] °     [phone] ° °OUTPATIENT: ° °     [facility] °     [address] °     [place], [place] [address] °     [phone] °

## 2014-08-05 NOTE — Progress Notes (Signed)
  CARE MANAGEMENT ED NOTE 08/05/2014  Patient:  Brent Gay,Brent Gay   Account Number:  [id number]  Date Initiated:  08/05/2014  Documentation initiated by:  Edd ArbourGIBBS,KIMBERLY  Subjective/Objective Assessment:   48 yr self pay [place] pt was brought in by EMS for chest pain and suicidal ideation.   [facility] from [facility].  Patient reported that he relapsed after he lost his job.     Subjective/Objective Assessment Detail:   no pcp per pt but he is wanting one to help him wiht "groin" issues  dx Alcohol use disorder, severe, dependence     Action/Plan:   spoke with pt about pcp services see notes below referral to [facility] completed   Action/Plan Detail:   Anticipated DC Date:  08/05/2014     Status Recommendation to Physician:   Result of Recommendation:    Other ED Services  Consult Working Plan    DC Planning Services  Other  Outpatient Services - Pt will follow up  PCP issues    Choice offered to / List presented to:            Status of service:  Completed, signed off  ED Comments:   ED Comments Detail:  CM spoke with pt who [place]. CM discussed and provided written information for self pay pcps, importance of pcp for Gay/u care, www.needymeds.org, www.goodrx.com, discounted pharmacies and other [place] resources such as [facility], [facility], affordable care act,  financial assistance, DSS and  health department  Reviewed resources for [place] self pay pcps like [facility], [facility] [address], [facility], general medical clinics, [facility] care plus others, medication resources, [facility] out patient pharmacies and housing Pt voiced understanding and appreciation of resources provided  Provided [facility] contact information Agreed to referral CM completed [name] referral

## 2014-08-05 NOTE — [facility] Suicide Risk Assessment (Addendum)
Suicide Risk Assessment  Discharge Assessment   [facility] Discharge Suicide Risk Assessment   Demographic Factors:  Male and Caucasian  Total Time spent with patient: 30 minutes   Musculoskeletal: Strength & Muscle Tone: within normal limits Gait & Station: normal Patient leans: N/A  Psychiatric Specialty Exam:     Blood pressure 142/85, pulse 102, temperature 98.2 F (36.8 C), temperature source Oral, resp. rate 17, SpO2 98 %.There is no weight on file to calculate BMI.  General Appearance: Casual  Eye Contact::  Good  Speech:  Normal Rate  Volume:  Normal  Mood:  Euthymic  Affect:  Congruent  Thought Process:  Coherent  Orientation:  Full (Time, Place, and Person)  Thought Content:  WDL  Suicidal Thoughts:  No  Homicidal Thoughts:  No  Memory:  Immediate;   Good Recent;   Good Remote;   Good  Judgement:  Fair  Insight:  Fair  Psychomotor Activity:  Normal  Concentration:  Good  Recall:  Good  Fund of Knowledge:Good  Language: Good  Akathisia:  No  Handed:  Right  AIMS (if indicated):     Assets:  Housing Leisure Time Physical Health Resilience Social Support  ADL's:  Intact  Cognition: WNL  Sleep:        Has this patient used any form of tobacco in the last 30 days? (Cigarettes, Smokeless Tobacco, Cigars, and/or Pipes) No Mental Status Per Nursing Assessment::   On Admission:   Alcohol detox/dependence  Current Mental Status by Physician: NA  Loss Factors: NA  Historical Factors: NA  Risk Reduction Factors:   Sense of responsibility to family, Living with another person, especially a relative, Positive social support and Positive therapeutic relationship  Continued Clinical Symptoms:  None  Cognitive Features That Contribute To Risk:  None    Suicide Risk:  Minimal: No identifiable suicidal ideation.  Patients presenting with no risk factors but with morbid ruminations; may be classified as minimal risk based on the severity of the depressive  symptoms  Principal Problem: Suicidal ideation Discharge Diagnoses:  Patient Active Problem List   Diagnosis Date Noted  . Alcohol dependence with uncomplicated withdrawal [F10.230] 08/05/2014    Priority: High  . Alcohol use disorder, severe, dependence [F10.20]     Priority: High  . Substance induced mood disorder [F19.94]     Priority: High  . Suicidal ideation [R45.851]     Priority: High  . Alcohol withdrawal syndrome with complication [F10.239] 06/15/2014  . Major depressive disorder, severe [F32.2]   . 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 Chronic pain syndrome [G89.4] 01/23/2014  . GAD (generalized anxiety disorder) [F41.1] 12/25/2013  . Change in blood platelet count [D69.6] 08/02/2013  . Anxiety disorder [F41.9] 06/06/2012  . Alcohol dependence with withdrawal [F10.239] 06/05/2012  . Alcohol withdrawal [F10.239] 06/05/2012  . Cocaine dependence [F14.20] 06/05/2012  . Polysubstance abuse [F19.10] 06/04/2012    Class: Acute  . Polysubstance dependence including opioid type drug, continuous use [F11.229] 06/04/2012    Class: Acute  . Cocaine abuse [F14.10] 06/03/2012    Class: Chronic  . Chest pain [R07.9] 04/25/2012  . Acute inflammation of the pancreas [K85.9] 08/24/2011  . ALCOHOL ABUSE [F10.10] 10/15/2009  . CHEST PAIN, ATYPICAL [R07.89] 10/15/2009      Plan Of Care/Follow-up recommendations:  Activity:  as tolerated Diet:  heart healthy diet  Is patient on multiple antipsychotic therapies at discharge:  No   Has Patient had three or more failed trials of antipsychotic monotherapy by history:  No  Recommended Plan for Multiple Antipsychotic Therapies: NA    LORD, JAMISON, PMH-NP 08/05/2014, 3:05 PM

## 2014-08-07 ENCOUNTER — Emergency Department (HOSPITAL_COMMUNITY)
Admission: EM | Admit: 2014-08-07 | Discharge: 2014-08-07 | Disposition: A | Discharge status: Home / Self Care | Payer: Self-pay | Attending: Emergency Medicine | Admitting: Emergency Medicine

## 2014-08-07 ENCOUNTER — Encounter (HOSPITAL_COMMUNITY): Payer: Self-pay | Admitting: Emergency Medicine

## 2014-08-07 ENCOUNTER — Emergency Department (HOSPITAL_COMMUNITY): Payer: Self-pay

## 2014-08-07 DIAGNOSIS — Z7982 Long term (current) use of aspirin: Secondary | ICD-10-CM | POA: Insufficient documentation

## 2014-08-07 DIAGNOSIS — R110 Nausea: Secondary | ICD-10-CM | POA: Insufficient documentation

## 2014-08-07 DIAGNOSIS — R0789 Other chest pain: Secondary | ICD-10-CM | POA: Insufficient documentation

## 2014-08-07 DIAGNOSIS — G8929 Other chronic pain: Secondary | ICD-10-CM | POA: Insufficient documentation

## 2014-08-07 DIAGNOSIS — I10 Essential (primary) hypertension: Secondary | ICD-10-CM | POA: Insufficient documentation

## 2014-08-07 DIAGNOSIS — K219 Gastro-esophageal reflux disease without esophagitis: Secondary | ICD-10-CM | POA: Insufficient documentation

## 2014-08-07 DIAGNOSIS — F329 Major depressive disorder, single episode, unspecified: Secondary | ICD-10-CM | POA: Insufficient documentation

## 2014-08-07 DIAGNOSIS — Z79899 Other long term (current) drug therapy: Secondary | ICD-10-CM | POA: Insufficient documentation

## 2014-08-07 DIAGNOSIS — R079 Chest pain, unspecified: Secondary | ICD-10-CM

## 2014-08-07 DIAGNOSIS — Z87891 Personal history of nicotine dependence: Secondary | ICD-10-CM | POA: Insufficient documentation

## 2014-08-07 LAB — CBC
HCT: 42.4 % (ref 39.0–52.0)
Hemoglobin: 14.9 g/dL (ref 13.0–17.0)
MCH: 31.9 pg (ref 26.0–34.0)
MCHC: 35.1 g/dL (ref 30.0–36.0)
MCV: 90.8 fL (ref 78.0–100.0)
Platelets: 146 10*3/uL — ABNORMAL LOW (ref 150–400)
RBC: 4.67 MIL/uL (ref 4.22–5.81)
RDW: 12.4 % (ref 11.5–15.5)
WBC: 8.9 10*3/uL (ref 4.0–10.5)

## 2014-08-07 LAB — BASIC METABOLIC PANEL
Anion gap: 11 (ref 5–15)
BUN: 8 mg/dL (ref 6–23)
CO2: 24 mmol/L (ref 19–32)
CREATININE: 0.66 mg/dL (ref 0.50–1.35)
Calcium: 8.6 mg/dL (ref 8.4–10.5)
Chloride: 102 mmol/L (ref 96–112)
GFR calc Af Amer: >90 mL/min (ref >90)
GFR calc non Af Amer: >90 mL/min (ref >90)
GLUCOSE: 113 mg/dL — AB (ref 70–99)
POTASSIUM: 3.9 mmol/L (ref 3.5–5.1)
Sodium: 137 mmol/L (ref 135–145)

## 2014-08-07 LAB — I-STAT TROPONIN, ED: TROPONIN I, POC: 0 ng/mL (ref 0.00–0.08)
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
## 2014-08-07 NOTE — Discharge Instructions (Signed)

## 2014-08-07 NOTE — ED Notes (Signed)
Pt c/o L sided CP under breast, non radiating onset yesterday @ 0600. Pt states he is stressed and believes that is where pain is coming from. Describes pain as a clamping.

## 2014-08-07 NOTE — ED Provider Notes (Signed)
Date: 08/07/2014  Rate: 110  Rhythm: sinus tachycardia  QRS Axis: normal  Intervals: normal  ST/T Wave abnormalities: normal  Conduction Disutrbances:none  Narrative Interpretation: PRWP  Old EKG Reviewed: none available    Ward GivensIva L Ceri Mayer, MD 08/07/14 [phone]

## 2014-08-12 ENCOUNTER — Inpatient Hospital Stay (HOSPITAL_COMMUNITY): Payer: Self-pay

## 2014-08-12 ENCOUNTER — Inpatient Hospital Stay (HOSPITAL_COMMUNITY)
Admission: EM | Admit: 2014-08-12 | Discharge: 2014-08-17 | DRG: 897 | Disposition: A | Discharge status: Home / Self Care | Payer: Self-pay | Attending: Internal Medicine | Admitting: Internal Medicine

## 2014-08-12 ENCOUNTER — Encounter (HOSPITAL_COMMUNITY): Payer: Self-pay

## 2014-08-12 DIAGNOSIS — Z811 Family history of alcohol abuse and dependence: Secondary | ICD-10-CM

## 2014-08-12 DIAGNOSIS — Z808 Family history of malignant neoplasm of other organs or systems: Secondary | ICD-10-CM

## 2014-08-12 DIAGNOSIS — K219 Gastro-esophageal reflux disease without esophagitis: Secondary | ICD-10-CM | POA: Diagnosis present

## 2014-08-12 DIAGNOSIS — R000 Tachycardia, unspecified: Secondary | ICD-10-CM | POA: Diagnosis present

## 2014-08-12 DIAGNOSIS — G894 Chronic pain syndrome: Secondary | ICD-10-CM | POA: Diagnosis present

## 2014-08-12 DIAGNOSIS — Z87891 Personal history of nicotine dependence: Secondary | ICD-10-CM

## 2014-08-12 DIAGNOSIS — K709 Alcoholic liver disease, unspecified: Secondary | ICD-10-CM | POA: Diagnosis present

## 2014-08-12 DIAGNOSIS — F10232 Alcohol dependence with withdrawal with perceptual disturbance: Secondary | ICD-10-CM

## 2014-08-12 DIAGNOSIS — Z79899 Other long term (current) drug therapy: Secondary | ICD-10-CM

## 2014-08-12 DIAGNOSIS — M549 Dorsalgia, unspecified: Secondary | ICD-10-CM | POA: Diagnosis present

## 2014-08-12 DIAGNOSIS — D696 Thrombocytopenia, unspecified: Secondary | ICD-10-CM | POA: Diagnosis present

## 2014-08-12 DIAGNOSIS — Z833 Family history of diabetes mellitus: Secondary | ICD-10-CM

## 2014-08-12 DIAGNOSIS — Y906 Blood alcohol level of 120-199 mg/100 ml: Secondary | ICD-10-CM | POA: Diagnosis present

## 2014-08-12 DIAGNOSIS — I471 Supraventricular tachycardia: Secondary | ICD-10-CM

## 2014-08-12 DIAGNOSIS — F329 Major depressive disorder, single episode, unspecified: Secondary | ICD-10-CM | POA: Diagnosis present

## 2014-08-12 DIAGNOSIS — F10932 Alcohol use, unspecified with withdrawal with perceptual disturbance: Secondary | ICD-10-CM

## 2014-08-12 DIAGNOSIS — F411 Generalized anxiety disorder: Secondary | ICD-10-CM | POA: Diagnosis present

## 2014-08-12 DIAGNOSIS — I10 Essential (primary) hypertension: Secondary | ICD-10-CM | POA: Insufficient documentation

## 2014-08-12 DIAGNOSIS — R1110 Vomiting, unspecified: Secondary | ICD-10-CM

## 2014-08-12 DIAGNOSIS — F10239 Alcohol dependence with withdrawal, unspecified: Principal | ICD-10-CM | POA: Diagnosis present

## 2014-08-12 DIAGNOSIS — F10939 Alcohol use, unspecified with withdrawal, unspecified: Secondary | ICD-10-CM | POA: Diagnosis present

## 2014-08-12 DIAGNOSIS — E348 Other specified endocrine disorders: Secondary | ICD-10-CM

## 2014-08-12 DIAGNOSIS — F1010 Alcohol abuse, uncomplicated: Secondary | ICD-10-CM | POA: Diagnosis present

## 2014-08-12 DIAGNOSIS — Z7982 Long term (current) use of aspirin: Secondary | ICD-10-CM

## 2014-08-12 DIAGNOSIS — E876 Hypokalemia: Secondary | ICD-10-CM | POA: Diagnosis present

## 2014-08-12 DIAGNOSIS — F1020 Alcohol dependence, uncomplicated: Secondary | ICD-10-CM | POA: Diagnosis present

## 2014-08-12 DIAGNOSIS — N503 Cyst of epididymis: Secondary | ICD-10-CM | POA: Diagnosis present

## 2014-08-12 HISTORY — DX: Cyst of epididymis: N50.3

## 2014-08-12 LAB — CBC WITH DIFFERENTIAL/PLATELET
Basophils Absolute: 0 10*3/uL (ref 0.0–0.1)
Basophils Relative: 1 % (ref 0–1)
EOS ABS: 0.1 10*3/uL (ref 0.0–0.7)
Eosinophils Relative: 1 % (ref 0–5)
HEMATOCRIT: 43.7 % (ref 39.0–52.0)
Hemoglobin: 15.3 g/dL (ref 13.0–17.0)
LYMPHS ABS: 2.5 10*3/uL (ref 0.7–4.0)
LYMPHS PCT: 31 % (ref 12–46)
MCH: 31.7 pg (ref 26.0–34.0)
MCHC: 35 g/dL (ref 30.0–36.0)
MCV: 90.5 fL (ref 78.0–100.0)
MONO ABS: 0.6 10*3/uL (ref 0.1–1.0)
Monocytes Relative: 7 % (ref 3–12)
Neutro Abs: 4.9 10*3/uL (ref 1.7–7.7)
Neutrophils Relative %: 60 % (ref 43–77)
PLATELETS: 161 10*3/uL (ref 150–400)
RBC: 4.83 MIL/uL (ref 4.22–5.81)
RDW: 12.5 % (ref 11.5–15.5)
WBC: 8.1 10*3/uL (ref 4.0–10.5)

## 2014-08-12 LAB — COMPREHENSIVE METABOLIC PANEL
ALBUMIN: 4.2 g/dL (ref 3.5–5.2)
ALT: 21 U/L (ref 0–53)
ANION GAP: 19 — AB (ref 5–15)
AST: 28 U/L (ref 0–37)
Alkaline Phosphatase: 73 U/L (ref 39–117)
BUN: 12 mg/dL (ref 6–23)
CO2: 20 mmol/L (ref 19–32)
Calcium: 8.1 mg/dL — ABNORMAL LOW (ref 8.4–10.5)
Chloride: 102 mmol/L (ref 96–112)
Creatinine, Ser: 0.89 mg/dL (ref 0.50–1.35)
GFR calc non Af Amer: >90 mL/min (ref >90)
GLUCOSE: 102 mg/dL — AB (ref 70–99)
Potassium: 3.3 mmol/L — ABNORMAL LOW (ref 3.5–5.1)
Sodium: 141 mmol/L (ref 135–145)
TOTAL PROTEIN: 7.2 g/dL (ref 6.0–8.3)
Total Bilirubin: 1 mg/dL (ref 0.3–1.2)

## 2014-08-12 LAB — I-STAT TROPONIN, ED: Troponin i, poc: 0 ng/mL (ref 0.00–0.08)

## 2014-08-12 LAB — URINALYSIS, ROUTINE W REFLEX MICROSCOPIC
Bilirubin Urine: NEGATIVE
Glucose, UA: NEGATIVE mg/dL
Hgb urine dipstick: NEGATIVE
KETONES UR: 15 mg/dL — AB
LEUKOCYTES UA: NEGATIVE
Nitrite: NEGATIVE
PROTEIN: NEGATIVE mg/dL
Specific Gravity, Urine: 1.018 (ref 1.005–1.030)
Urobilinogen, UA: 0.2 mg/dL (ref 0.0–1.0)
pH: 5.5 (ref 5.0–8.0)

## 2014-08-12 LAB — RAPID URINE DRUG SCREEN, HOSP PERFORMED
Amphetamines: NOT DETECTED
BARBITURATES: NOT DETECTED
Benzodiazepines: NOT DETECTED
Cocaine: NOT DETECTED
Opiates: NOT DETECTED
Tetrahydrocannabinol: NOT DETECTED

## 2014-08-12 LAB — PHOSPHORUS: Phosphorus: 2.7 mg/dL (ref 2.3–4.6)

## 2014-08-12 LAB — MAGNESIUM: MAGNESIUM: 1.6 mg/dL (ref 1.5–2.5)

## 2014-08-12 LAB — LIPASE, BLOOD: LIPASE: 22 U/L (ref 11–59)

## 2014-08-12 LAB — MRSA PCR SCREENING: MRSA by PCR: NEGATIVE

## 2014-08-12 LAB — TSH: TSH: 1.791 u[IU]/mL (ref 0.350–4.500)

## 2014-08-12 LAB — ETHANOL: ALCOHOL ETHYL (B): 135 mg/dL — AB (ref 0–9)

## 2014-08-12 MED ORDER — IPRATROPIUM BROMIDE 0.02 % IN SOLN: 0.5000 mg | RESPIRATORY_TRACT | Status: DC | PRN

## 2014-08-12 MED ORDER — ONDANSETRON HCL 4 MG/2ML IJ SOLN
4.0000 mg | Freq: Once | INTRAMUSCULAR | Status: AC
  Administered 2014-08-12: 4 mg via INTRAVENOUS
  Filled 2014-08-12: qty 2

## 2014-08-12 MED ORDER — VITAMIN B-1 100 MG PO TABS
100.0000 mg | ORAL_TABLET | Freq: Every day | ORAL | Status: DC
  Administered 2014-08-12: 100 mg via ORAL
  Filled 2014-08-12: qty 1

## 2014-08-12 MED ORDER — MORPHINE SULFATE 2 MG/ML IJ SOLN
2.0000 mg | Freq: Once | INTRAMUSCULAR | Status: AC
  Administered 2014-08-12: 2 mg via INTRAVENOUS

## 2014-08-12 MED ORDER — ONDANSETRON HCL 4 MG PO TABS
4.0000 mg | ORAL_TABLET | Freq: Four times a day (QID) | ORAL | Status: DC | PRN
  Administered 2014-08-13 – 2014-08-14 (×2): 4 mg via ORAL
  Filled 2014-08-12 (×2): qty 1

## 2014-08-12 MED ORDER — LEVALBUTEROL HCL 0.63 MG/3ML IN NEBU: 0.6300 mg | INHALATION_SOLUTION | RESPIRATORY_TRACT | Status: DC | PRN

## 2014-08-12 MED ORDER — KETOROLAC TROMETHAMINE 30 MG/ML IJ SOLN
30.0000 mg | Freq: Once | INTRAMUSCULAR | Status: AC
  Administered 2014-08-12: 30 mg via INTRAVENOUS
  Filled 2014-08-12: qty 1

## 2014-08-12 MED ORDER — ASPIRIN EC 325 MG PO TBEC
325.0000 mg | DELAYED_RELEASE_TABLET | Freq: Every day | ORAL | Status: DC
  Administered 2014-08-12 – 2014-08-17 (×6): 325 mg via ORAL
  Filled 2014-08-12 (×7): qty 1

## 2014-08-12 MED ORDER — SODIUM CHLORIDE 0.9 % IJ SOLN
3.0000 mL | Freq: Two times a day (BID) | INTRAMUSCULAR | Status: DC
  Administered 2014-08-12 – 2014-08-16 (×3): 3 mL via INTRAVENOUS

## 2014-08-12 MED ORDER — ENOXAPARIN SODIUM 40 MG/0.4ML ~~LOC~~ SOLN
40.0000 mg | SUBCUTANEOUS | Status: DC
  Administered 2014-08-12 – 2014-08-13 (×2): 40 mg via SUBCUTANEOUS
  Filled 2014-08-12 (×2): qty 0.4

## 2014-08-12 MED ORDER — DOCUSATE SODIUM 100 MG PO CAPS
100.0000 mg | ORAL_CAPSULE | Freq: Two times a day (BID) | ORAL | Status: DC
  Administered 2014-08-12 – 2014-08-13 (×2): 100 mg via ORAL
  Filled 2014-08-12 (×3): qty 1

## 2014-08-12 MED ORDER — PANTOPRAZOLE SODIUM 40 MG IV SOLR
40.0000 mg | Freq: Once | INTRAVENOUS | Status: AC
  Administered 2014-08-12: 40 mg via INTRAVENOUS
  Filled 2014-08-12: qty 40

## 2014-08-12 MED ORDER — OXYCODONE HCL 5 MG PO TABS
5.0000 mg | ORAL_TABLET | ORAL | Status: DC | PRN
  Administered 2014-08-12 – 2014-08-17 (×15): 5 mg via ORAL
  Filled 2014-08-12 (×15): qty 1

## 2014-08-12 MED ORDER — THIAMINE HCL 100 MG/ML IJ SOLN
Freq: Once | INTRAVENOUS | Status: AC
  Administered 2014-08-12: 17:00:00 via INTRAVENOUS
  Filled 2014-08-12: qty 1000

## 2014-08-12 MED ORDER — IBUPROFEN 200 MG PO TABS
400.0000 mg | ORAL_TABLET | Freq: Four times a day (QID) | ORAL | Status: DC | PRN
  Administered 2014-08-16 – 2014-08-17 (×4): 400 mg via ORAL
  Filled 2014-08-12 (×4): qty 2

## 2014-08-12 MED ORDER — MORPHINE SULFATE 2 MG/ML IJ SOLN
2.0000 mg | INTRAMUSCULAR | Status: DC | PRN
  Administered 2014-08-12 – 2014-08-17 (×21): 2 mg via INTRAVENOUS
  Filled 2014-08-12 (×22): qty 1

## 2014-08-12 MED ORDER — MAGNESIUM SULFATE 2 GM/50ML IV SOLN
2.0000 g | Freq: Once | INTRAVENOUS | Status: AC
  Administered 2014-08-12: 2 g via INTRAVENOUS
  Filled 2014-08-12: qty 50

## 2014-08-12 MED ORDER — ACETAMINOPHEN 650 MG RE SUPP: 650.0000 mg | Freq: Four times a day (QID) | RECTAL | Status: DC | PRN

## 2014-08-12 MED ORDER — FOLIC ACID 5 MG/ML IJ SOLN
1.0000 mg | Freq: Every day | INTRAMUSCULAR | Status: DC
  Administered 2014-08-13 – 2014-08-15 (×3): 1 mg via INTRAVENOUS
  Filled 2014-08-12 (×7): qty 0.2

## 2014-08-12 MED ORDER — ADULT MULTIVITAMIN W/MINERALS CH
1.0000 | ORAL_TABLET | Freq: Every day | ORAL | Status: DC
  Administered 2014-08-13: 1 via ORAL
  Filled 2014-08-12: qty 1

## 2014-08-12 MED ORDER — LORAZEPAM 2 MG/ML IJ SOLN
0.0000 mg | Freq: Four times a day (QID) | INTRAMUSCULAR | Status: DC
  Administered 2014-08-12: 2 mg via INTRAVENOUS

## 2014-08-12 MED ORDER — LORAZEPAM 1 MG PO TABS
0.0000 mg | ORAL_TABLET | Freq: Two times a day (BID) | ORAL | Status: DC
  Filled 2014-08-12: qty 2

## 2014-08-12 MED ORDER — POLYETHYLENE GLYCOL 3350 17 G PO PACK
17.0000 g | PACK | Freq: Every day | ORAL | Status: DC | PRN
  Filled 2014-08-12: qty 1

## 2014-08-12 MED ORDER — SORBITOL 70 % SOLN
30.0000 mL | Freq: Every day | Status: DC | PRN
  Filled 2014-08-12: qty 30

## 2014-08-12 MED ORDER — LORAZEPAM 2 MG/ML IJ SOLN
1.0000 mg | Freq: Once | INTRAMUSCULAR | Status: AC
Start: 2014-08-12 — End: 2014-08-12
  Administered 2014-08-12: 1 mg via INTRAVENOUS

## 2014-08-12 MED ORDER — PANTOPRAZOLE SODIUM 40 MG PO TBEC
40.0000 mg | DELAYED_RELEASE_TABLET | Freq: Every day | ORAL | Status: DC
  Administered 2014-08-13 – 2014-08-17 (×5): 40 mg via ORAL
  Filled 2014-08-12 (×5): qty 1

## 2014-08-12 MED ORDER — MAGNESIUM CITRATE PO SOLN: 1.0000 | Freq: Once | ORAL | Status: AC | PRN

## 2014-08-12 MED ORDER — LORAZEPAM 1 MG PO TABS: 0.0000 mg | ORAL_TABLET | Freq: Four times a day (QID) | ORAL | Status: DC

## 2014-08-12 MED ORDER — LORAZEPAM 2 MG/ML IJ SOLN
2.0000 mg | INTRAMUSCULAR | Status: DC | PRN
  Administered 2014-08-12 – 2014-08-13 (×7): 2 mg via INTRAVENOUS
  Filled 2014-08-12 (×7): qty 1

## 2014-08-12 MED ORDER — ALUM & MAG HYDROXIDE-SIMETH 200-200-20 MG/5ML PO SUSP
30.0000 mL | Freq: Four times a day (QID) | ORAL | Status: DC | PRN
Start: 2014-08-12 — End: 2014-08-17

## 2014-08-12 MED ORDER — ONDANSETRON HCL 4 MG/2ML IJ SOLN
4.0000 mg | Freq: Four times a day (QID) | INTRAMUSCULAR | Status: DC | PRN
  Administered 2014-08-12 – 2014-08-14 (×2): 4 mg via INTRAVENOUS
  Filled 2014-08-12 (×2): qty 2

## 2014-08-12 MED ORDER — SODIUM CHLORIDE 0.9 % IV BOLUS (SEPSIS)
1000.0000 mL | Freq: Once | INTRAVENOUS | Status: AC
  Administered 2014-08-12: 1000 mL via INTRAVENOUS

## 2014-08-12 MED ORDER — HYDROXYZINE HCL 25 MG PO TABS
25.0000 mg | ORAL_TABLET | Freq: Four times a day (QID) | ORAL | Status: DC | PRN
  Administered 2014-08-12 – 2014-08-17 (×6): 25 mg via ORAL
  Filled 2014-08-12 (×6): qty 1

## 2014-08-12 MED ORDER — ACETAMINOPHEN 325 MG PO TABS: 650.0000 mg | ORAL_TABLET | Freq: Four times a day (QID) | ORAL | Status: DC | PRN

## 2014-08-12 MED ORDER — LORAZEPAM 2 MG/ML IJ SOLN
0.0000 mg | Freq: Two times a day (BID) | INTRAMUSCULAR | Status: DC
Start: 2014-08-12 — End: 2014-08-12
  Filled 2014-08-12: qty 1

## 2014-08-12 MED ORDER — SODIUM CHLORIDE 0.9 % IV SOLN
INTRAVENOUS | Status: DC
  Administered 2014-08-12 – 2014-08-16 (×7): via INTRAVENOUS

## 2014-08-12 MED ORDER — THIAMINE HCL 100 MG/ML IJ SOLN
100.0000 mg | Freq: Every day | INTRAMUSCULAR | Status: DC
  Administered 2014-08-13: 100 mg via INTRAVENOUS
  Filled 2014-08-12: qty 2

## 2014-08-12 MED ORDER — LORAZEPAM 2 MG/ML IJ SOLN
INTRAMUSCULAR | Status: AC
  Filled 2014-08-12: qty 1

## 2014-08-12 MED ORDER — GI COCKTAIL ~~LOC~~: 30.0000 mL | Freq: Three times a day (TID) | ORAL | Status: DC | PRN
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
## 2014-08-12 NOTE — ED Notes (Signed)
Per EMS, pt from hotel.  Pt has been drinker for 20 years.  Drank large amount of vodka last night.  Woke up this morning with sever n/v.  IV started in rt.  20 g R hand,  Zofran 4mg  IM given.  Sinus tach on monitor.  Vitals: 148/96, hr 110, resp 16, 98% ra, cbg 118

## 2014-08-12 NOTE — ED Notes (Signed)
Report called Velna HatchetSheila

## 2014-08-12 NOTE — ED Notes (Signed)
Bed: [facility] Expected date:  Expected time:  Means of arrival:  Comments: EMS- ETOH Detox

## 2014-08-12 NOTE — Plan of Care (Addendum)
Problem: Phase I Progression Outcomes Goal: OOB as tolerated unless otherwise ordered Outcome: Progressing Safety limits placed, call bell at bedside- frequent contacts throughout shift. Goal: Hemodynamically stable Outcome: Progressing Close monitoring of hemodynamics.  Sinus Tach on monitor.

## 2014-08-12 NOTE — ED Notes (Signed)
Pt unable to void at this time. 
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
## 2014-08-13 ENCOUNTER — Encounter (HOSPITAL_COMMUNITY): Payer: Self-pay | Admitting: Internal Medicine

## 2014-08-13 DIAGNOSIS — F411 Generalized anxiety disorder: Secondary | ICD-10-CM

## 2014-08-13 DIAGNOSIS — I10 Essential (primary) hypertension: Secondary | ICD-10-CM

## 2014-08-13 DIAGNOSIS — N503 Cyst of epididymis: Secondary | ICD-10-CM

## 2014-08-13 HISTORY — DX: Cyst of epididymis: N50.3

## 2014-08-13 LAB — GLUCOSE, CAPILLARY: GLUCOSE-CAPILLARY: 141 mg/dL — AB (ref 70–99)

## 2014-08-13 LAB — COMPREHENSIVE METABOLIC PANEL
ALBUMIN: 3.2 g/dL — AB (ref 3.5–5.2)
ALT: 17 U/L (ref 0–53)
AST: 18 U/L (ref 0–37)
Alkaline Phosphatase: 55 U/L (ref 39–117)
Anion gap: 6 (ref 5–15)
BUN: 8 mg/dL (ref 6–23)
CHLORIDE: 103 mmol/L (ref 96–112)
CO2: 26 mmol/L (ref 19–32)
Calcium: 7.4 mg/dL — ABNORMAL LOW (ref 8.4–10.5)
Creatinine, Ser: 0.77 mg/dL (ref 0.50–1.35)
GFR calc Af Amer: >90 mL/min (ref >90)
Glucose, Bld: 91 mg/dL (ref 70–99)
Potassium: 3.3 mmol/L — ABNORMAL LOW (ref 3.5–5.1)
SODIUM: 135 mmol/L (ref 135–145)
Total Bilirubin: 1.3 mg/dL — ABNORMAL HIGH (ref 0.3–1.2)
Total Protein: 5.6 g/dL — ABNORMAL LOW (ref 6.0–8.3)

## 2014-08-13 LAB — CBC
HEMATOCRIT: 36.1 % — AB (ref 39.0–52.0)
HEMOGLOBIN: 12.6 g/dL — AB (ref 13.0–17.0)
MCH: 31.9 pg (ref 26.0–34.0)
MCHC: 34.9 g/dL (ref 30.0–36.0)
MCV: 91.4 fL (ref 78.0–100.0)
Platelets: 72 10*3/uL — ABNORMAL LOW (ref 150–400)
RBC: 3.95 MIL/uL — ABNORMAL LOW (ref 4.22–5.81)
RDW: 12.5 % (ref 11.5–15.5)
WBC: 6.7 10*3/uL (ref 4.0–10.5)

## 2014-08-13 LAB — MAGNESIUM: Magnesium: 1.8 mg/dL (ref 1.5–2.5)

## 2014-08-13 MED ORDER — LORAZEPAM 2 MG/ML IJ SOLN
0.0000 mg | Freq: Four times a day (QID) | INTRAMUSCULAR | Status: DC
Start: 2014-08-13 — End: 2014-08-15
  Administered 2014-08-13: 2 mg via INTRAVENOUS
  Administered 2014-08-14 (×2): 1 mg via INTRAVENOUS
  Administered 2014-08-14 – 2014-08-15 (×3): 2 mg via INTRAVENOUS
  Filled 2014-08-13 (×5): qty 1

## 2014-08-13 MED ORDER — POTASSIUM CHLORIDE CRYS ER 20 MEQ PO TBCR
40.0000 meq | EXTENDED_RELEASE_TABLET | Freq: Once | ORAL | Status: AC
  Administered 2014-08-13: 40 meq via ORAL
  Filled 2014-08-13: qty 2

## 2014-08-13 MED ORDER — LORAZEPAM 2 MG/ML IJ SOLN
1.0000 mg | Freq: Four times a day (QID) | INTRAMUSCULAR | Status: DC | PRN
  Administered 2014-08-13 – 2014-08-16 (×8): 1 mg via INTRAVENOUS
  Filled 2014-08-13 (×9): qty 1

## 2014-08-13 MED ORDER — ADULT MULTIVITAMIN W/MINERALS CH
1.0000 | ORAL_TABLET | Freq: Every day | ORAL | Status: DC
Start: 2014-08-13 — End: 2014-08-17
  Administered 2014-08-13 – 2014-08-17 (×5): 1 via ORAL
  Filled 2014-08-13 (×5): qty 1

## 2014-08-13 MED ORDER — MAGNESIUM SULFATE 2 GM/50ML IV SOLN
2.0000 g | Freq: Once | INTRAVENOUS | Status: AC
  Administered 2014-08-13: 2 g via INTRAVENOUS
  Filled 2014-08-13: qty 50

## 2014-08-13 MED ORDER — VITAMIN B-1 100 MG PO TABS
100.0000 mg | ORAL_TABLET | Freq: Every day | ORAL | Status: DC
  Administered 2014-08-13 – 2014-08-17 (×5): 100 mg via ORAL
  Filled 2014-08-13 (×5): qty 1

## 2014-08-13 MED ORDER — THIAMINE HCL 100 MG/ML IJ SOLN: 100.0000 mg | Freq: Every day | INTRAMUSCULAR | Status: DC

## 2014-08-13 MED ORDER — METOPROLOL TARTRATE 25 MG PO TABS
12.5000 mg | ORAL_TABLET | Freq: Two times a day (BID) | ORAL | Status: DC
  Administered 2014-08-13 – 2014-08-17 (×9): 12.5 mg via ORAL
  Filled 2014-08-13 (×9): qty 1

## 2014-08-13 MED ORDER — LORAZEPAM 1 MG PO TABS
1.0000 mg | ORAL_TABLET | Freq: Four times a day (QID) | ORAL | Status: DC | PRN
  Administered 2014-08-14 (×2): 1 mg via ORAL
  Filled 2014-08-13 (×2): qty 1

## 2014-08-13 MED ORDER — LORAZEPAM 2 MG/ML IJ SOLN
0.0000 mg | Freq: Two times a day (BID) | INTRAMUSCULAR | Status: DC
Start: 2014-08-15 — End: 2014-08-15

## 2014-08-13 NOTE — Evaluation (Signed)
Occupational Therapy Evaluation Patient Details Name: Brent Gay MRN: [id number] DOB: 1966/05/30 Today's Date: 08/13/2014    History of Present Illness 49 yo male admitted 2/29 with  relapse ETOH, AH?VH, nausea, vomiting and diarrhea.    Clinical Impression   Pt was admitted for the above.   He had poor activity tolerance during evaluation and needs overall min A for ADLs and transfers.  Pt was independent prior to admission.  Will follow in acute with mod I level goals.     Follow Up Recommendations  Supervision/Assistance - 24 hour;Supervision - Intermittent (vs none, depending upon progress)    Equipment Recommendations  None recommended by OT    Recommendations for Other Services       Precautions / Restrictions Precautions Precautions: Fall Restrictions Weight Bearing Restrictions: No      Mobility Bed Mobility Overal bed mobility: Modified Independent                Transfers   Equipment used: Rolling walker (2 wheeled);None Transfers: Sit to/from Stand Sit to Stand: Min guard              Balance           Standing balance support: During functional activity   Standing balance comment: min guard for safety                            ADL Overall ADL's : Needs assistance/impaired     Grooming: Oral care;Sitting;Standing;Min guard               Lower Body Dressing: Minimal assistance;Sit to/from stand   Toilet Transfer: Minimal assistance;Ambulation (to chair)             General ADL Comments: Pt needs overall supervision for UB adls and min A for LB adls.  Pt with poor activity tolerance:  felt like he needed to sit to brush teeth:  poor safety awareness--min guard for safety.       Vision     Perception     Praxis      Pertinent Vitals/Pain Faces Pain Scale: Hurts little more Pain Location: L shoulder blad Pain Descriptors / Indicators: Aching Pain Intervention(s): Limited activity within patient's  tolerance;Monitored during session     Hand Dominance     Extremity/Trunk Assessment Upper Extremity Assessment Upper Extremity Assessment: Generalized weakness           Communication Communication Communication: No difficulties   Cognition Arousal/Alertness: Lethargic;Suspect due to medications Behavior During Therapy: Flat affect Overall Cognitive Status: Impaired/Different from baseline Area of Impairment: Orientation;Attention;Safety/judgement;Awareness Orientation Level:  (also not oriented to birthday tomorrow):  Stated birthday was first, then second then 29th Current Attention Level: Sustained     Safety/Judgement: Decreased awareness of safety;Decreased awareness of deficits     General Comments: Pt sleeping initially, fell asleep as soon as he laid back down. Cues for safety.  Pt is aware of condition but not date nor birth date   General Comments       Exercises       Shoulder Instructions      Home Living   Living Arrangements: Alone                                      Prior Functioning/Environment Level of Independence: Independent  OT Diagnosis: Generalized weakness   OT Problem List: Decreased strength;Decreased activity tolerance;Impaired balance (sitting and/or standing);Decreased cognition;Pain   OT Treatment/Interventions: Self-care/ADL training;DME and/or AE instruction;Balance training;Patient/family education;Cognitive remediation/compensation    OT Goals(Current goals can be found in the care plan section) Acute Rehab OT Goals Patient Stated Goal: none stated:  agreeable to OT OT Goal Formulation: With patient Time For Goal Achievement: 08/20/14 Potential to Achieve Goals: Good ADL Goals Pt Will Transfer to Toilet: with modified independence;ambulating;regular height toilet Additional ADL Goal #1: Pt will gather clothes and complete ADL at mod I level without cues for safety/attention to task  OT  Frequency: Min 2X/week   Barriers to D/C:            Co-evaluation              End of Session    Activity Tolerance: Patient limited by fatigue Patient left: in bed;with call bell/phone within reach;with bed alarm set   Time: [id number] OT Time Calculation (min): 16 min Charges:  OT General Charges $OT Visit: 1 Procedure OT Evaluation $Initial OT Evaluation Tier I: 1 Procedure G-Codes:    Brent Gay 08/13/2014, 3:28 PM Brent Gay, OTR/L [phone] 08/13/2014

## 2014-08-13 NOTE — Progress Notes (Signed)
CARE MANAGEMENT NOTE 08/13/2014  Patient:  Milus HeightFARLEY,Makyi F   Account Number:  [id number]  Date Initiated:  08/13/2014  Documentation initiated by:  Jerad Dunlap  Subjective/Objective Assessment:   etoh withdrawal  syndrome     Action/Plan:   home when stable   Anticipated DC Date:  08/16/2014   Anticipated DC Plan:  HOME/SELF CARE  In-house referral  Clinical Social Worker      DC Planning Services  CM consult      [facility]   Choice offered to / List presented to:  NA      DME agency  NA        HH agency  NA   Status of service:  In process, will continue to follow Medicare Important Message given?   (If response is "NO", the following Medicare IM given date fields will be blank) Date Medicare IM given:   Medicare IM given by:   Date Additional Medicare IM given:   Additional Medicare IM given by:    Discharge Disposition:    Per UR Regulation:  Reviewed for med. necessity/level of care/duration of stay  If discussed at Long Length of Stay Meetings, dates discussed:    Comments:  August 13, 2014/Kindell Strada L. Earlene Plateravis, RN, BSN, CCM. Case Management [employer] [phone] No discharge needs present of time of review.

## 2014-08-13 NOTE — Progress Notes (Signed)
Clinical Social Work  CSW met with patient at bedside, introduced self and explained role. CSW attempted to complete SBIRT tool. Patient states that he is extremely tired, and kept dozing off when we were talking. CSW woke patient to continue conversation, patient reports that he is too tired to talk to anyone and wishes CSW comes by another time. CSW will follow back up with patient at later time.   Nyzaiah Kai BSW Intern  [phone] 

## 2014-08-13 NOTE — Progress Notes (Signed)
[facility] PROGRESS NOTE  Brent Gay ZOX:[id number] DOB: [birth year] DOA: 08/12/2014 PCP: No PCP Per Patient  Assessment/Plan: #1 ETOH withdrawal/ETOH detox Patient with improving tremors. Patient with no further emesis. Patient with some nausea. Continue Ativan CIWA protocol, IV fluids, supportive care. Continue thiamine, folic acid, multivitamin. Social work consult. Follow.  #2 hypokalemia Likely secondary to GI losses. Replete. Keep magnesium greater than 2.  #3 epididymal cyst Per scrotal ultrasound. Outpatient follow-up with urology.  #4 generalized anxiety disorder On the Ativan CIWA protocol.  #5 sinus tachycardia Likely secondary to problem #1. Improved. TSH within normal limits. IV fluids.  #6 hypertension Resume home regimen of metoprolol.  #7 chronic pain management Oxycodone when necessary.  #8 prophylaxis PPI for GI prophylaxis. Lovenox for DVT prophylaxis.   Code Status: Full Family Communication: updated patient no family present. Disposition Plan: Remain inpatient.   Consultants:  None  Procedures:  CXR 08/12/14  Scrotal ultrasound 08/12/2014  Antibiotics:  None  HPI/Subjective: Patient states tremors improving. Some nausea, no emesis. Soft stools.  Objective: Filed Vitals:   08/13/14 0358  BP:   Pulse:   Temp: 98.3 F (36.8 C)  Resp:     Intake/Output Summary (Last 24 hours) at 08/13/14 0820 Last data filed at 08/13/14 0700  Gross per 24 hour  Intake   4045 ml  Output    875 ml  Net   3170 ml   Filed Weights   08/12/14 1524 08/13/14 0358  Weight: 95 kg (209 lb 7 oz) 96.9 kg (213 lb 10 oz)    Exam:   General:  NAD. Improving tremors  Cardiovascular: RRR  Respiratory: CTAB  Abdomen: Soft/NT/ND/+BS  Musculoskeletal: No c/c/e  Data Reviewed: Basic Metabolic Panel:  Recent Labs Lab 08/07/14 0532 08/12/14 1050 08/12/14 1552 08/13/14 0350  NA 137 141  --  135  K 3.9 3.3*  --  3.3*  CL 102 102  --  103   CO2 24 20  --  26  GLUCOSE 113* 102*  --  91  BUN 8 12  --  8  CREATININE 0.66 0.89  --  0.77  CALCIUM 8.6 8.1*  --  7.4*  MG  --  1.6  --  1.8  PHOS  --   --  2.7  --    Liver Function Tests:  Recent Labs Lab 08/12/14 1050 08/13/14 0350  AST 28 18  ALT 21 17  ALKPHOS 73 55  BILITOT 1.0 1.3*  PROT 7.2 5.6*  ALBUMIN 4.2 3.2*    Recent Labs Lab 08/12/14 1050  LIPASE 22   No results for input(s): AMMONIA in the last 168 hours. CBC:  Recent Labs Lab 08/07/14 0532 08/12/14 1050 08/13/14 0350  WBC 8.9 8.1 6.7  NEUTROABS  --  4.9  --   HGB 14.9 15.3 12.6*  HCT 42.4 43.7 36.1*  MCV 90.8 90.5 91.4  PLT 146* 161 72*   Cardiac Enzymes: No results for input(s): CKTOTAL, CKMB, CKMBINDEX, TROPONINI in the last 168 hours. BNP (last 3 results) No results for input(s): BNP in the last 8760 hours.  ProBNP (last 3 results) No results for input(s): PROBNP in the last 8760 hours.  CBG: No results for input(s): GLUCAP in the last 168 hours.  Recent Results (from the past 240 hour(s))  MRSA PCR Screening     Status: None   Collection Time: 08/12/14  3:39 PM  Result Value Ref Range Status   MRSA by PCR NEGATIVE NEGATIVE Final  Comment:        The GeneXpert MRSA Assay (FDA approved for NASAL specimens only), is one component of a comprehensive MRSA colonization surveillance program. It is not intended to diagnose MRSA infection nor to guide or monitor treatment for MRSA infections.      Studies: X-ray Chest Pa And Lateral  08/12/2014   CLINICAL DATA:  Nausea and vomiting  EXAM: CHEST  2 VIEW  COMPARISON:  08/07/2014  FINDINGS: The heart size and mediastinal contours are within normal limits. Both lungs are clear. The visualized skeletal structures are unremarkable.  IMPRESSION: No active cardiopulmonary disease.   Electronically Signed   By: Christiana PellantGretchen  Green M.D.   On: 08/12/2014 15:13   [place] Scrotum  08/12/2014   CLINICAL DATA:  Left testicular pain for several  months.  EXAM: SCROTAL ULTRASOUND  DOPPLER ULTRASOUND OF THE TESTICLES  TECHNIQUE: Complete ultrasound examination of the testicles, epididymis, and other scrotal structures was performed. Color and spectral Doppler ultrasound were also utilized to evaluate blood flow to the testicles.  COMPARISON:  None.  FINDINGS: Right testicle  Measurements: Normal in size and homogeneous echotexture measuring 4.8 x 2.9 x 3.0 cm. No mass or microlithiasis visualized.  Left testicle  Measurements: Normal sized homogeneous in echotexture measuring 5.1 x 3.2 x 3.4 cm. No mass or microlithiasis visualized.  Right epididymis:  Normal in size and appearance.  Left epididymis: Relatively large epididymal cyst measuring 1.7 x 1.6 x 2.6 cm. This is No complexity or internal flow.  Hydrocele:  None visualized.  Varicocele:  None visualized.  Pulsed Doppler interrogation of both testes demonstrates normal low resistance arterial and venous waveforms bilaterally.  IMPRESSION: 1. Normal testicles.  Normal blood flow to the testicles. 2. Large left epididymal cyst measuring up to 2.6 cm. While this is a benign finding, this may be the source of patient's pain. Recommend nonemergent urology consultation.   Electronically Signed   By: Genevive BiStewart  Edmunds M.D.   On: 08/12/2014 21:52   [place] Art/ven Flow Abd Pelv Doppler  08/12/2014   CLINICAL DATA:  Left testicular pain for several months.  EXAM: SCROTAL ULTRASOUND  DOPPLER ULTRASOUND OF THE TESTICLES  TECHNIQUE: Complete ultrasound examination of the testicles, epididymis, and other scrotal structures was performed. Color and spectral Doppler ultrasound were also utilized to evaluate blood flow to the testicles.  COMPARISON:  None.  FINDINGS: Right testicle  Measurements: Normal in size and homogeneous echotexture measuring 4.8 x 2.9 x 3.0 cm. No mass or microlithiasis visualized.  Left testicle  Measurements: Normal sized homogeneous in echotexture measuring 5.1 x 3.2 x 3.4 cm. No mass or  microlithiasis visualized.  Right epididymis:  Normal in size and appearance.  Left epididymis: Relatively large epididymal cyst measuring 1.7 x 1.6 x 2.6 cm. This is No complexity or internal flow.  Hydrocele:  None visualized.  Varicocele:  None visualized.  Pulsed Doppler interrogation of both testes demonstrates normal low resistance arterial and venous waveforms bilaterally.  IMPRESSION: 1. Normal testicles.  Normal blood flow to the testicles. 2. Large left epididymal cyst measuring up to 2.6 cm. While this is a benign finding, this may be the source of patient's pain. Recommend nonemergent urology consultation.   Electronically Signed   By: Genevive BiStewart  Edmunds M.D.   On: 08/12/2014 21:52    Scheduled Meds: . aspirin EC  325 mg Oral Daily  . docusate sodium  100 mg Oral BID  . enoxaparin (LOVENOX) injection  40 mg Subcutaneous Q24H  . folic acid  1 mg Intravenous Daily  . magnesium sulfate 1 - 4 g bolus IVPB  2 g Intravenous Once  . metoprolol tartrate  12.5 mg Oral BID  . multivitamin with minerals  1 tablet Oral Daily  . pantoprazole  40 mg Oral Q0600  . potassium chloride  40 mEq Oral Once  . sodium chloride  3 mL Intravenous Q12H  . thiamine  100 mg Intravenous Daily   Continuous Infusions: . sodium chloride 125 mL/hr at 08/13/14 3086    Principal Problem:   Alcohol withdrawal Active Problems:   Alcohol abuse   GAD (generalized anxiety disorder)   Chronic pain syndrome   Alcohol use disorder, severe, dependence   Sinus tachycardia   Hypokalemia   Epididymal cyst    Time spent: 40 mins    [facility] MD [facility] Pager [phone]. If 7PM-7AM, please contact night-coverage at [email or web address], password [facility] 08/13/2014, 8:20 AM  LOS: 1 day

## 2014-08-13 NOTE — Evaluation (Addendum)
Physical Therapy Evaluation Patient Details Name: Brent Gay MRN: [id number] DOB: 06/26/1965 Today's Date: 08/13/2014   History of Present Illness  49 yo male admitted 2/29 with  relapse ETOH/detox protocol, N/V/D tachycardia. Recenetly  In ED with  AH/VH, SI chest pain.  Clinical Impression  Patient is lethargic this am and limited  Ambulation because if it. When awake, patient should be ambulatory. Patient will benefit from PT to address  Problems listed in note below. Disposition unknown.    Follow Up Recommendations No PT follow up (anticipate return to baseline as  pt. recovers from acute illness.)    Equipment Recommendations  None recommended by PT    Recommendations for Other Services       Precautions / Restrictions Precautions Precautions: Fall      Mobility  Bed Mobility               General bed mobility comments: sitting on the edge of the bed.  Transfers Overall transfer level: Needs assistance Equipment used: Rolling walker (2 wheeled);None Transfers: Sit to/from Stand Sit to Stand: Min guard            Ambulation/Gait Ambulation/Gait assistance: Min assist Ambulation Distance (Feet): 80 Feet Assistive device: Rolling walker (2 wheeled) Gait Pattern/deviations: Step-through pattern;Step-to pattern;Decreased stride length;Staggering left;Staggering right;Wide base of support     General Gait Details: 7880' with RW then 1615' w/ no AD, held onto bed with steady assist,  patient lethargic, limited   ambulation due to lethargy and unsteady gait, st  Stairs            Wheelchair Mobility    Modified Rankin (Stroke Patients Only)       Balance Overall balance assessment: Needs assistance         Standing balance support: During functional activity;No upper extremity supported Standing balance-Leahy Scale: Fair                               Pertinent Vitals/Pain Pain Assessment: Faces Faces Pain Scale: Hurts little  more Pain Location: abdomen Pain Descriptors / Indicators: Discomfort;Cramping Pain Intervention(s): Monitored during session;Premedicated before session    Home Living   Living Arrangements: Alone               [facility]    Prior Function Level of Independence: Independent               Hand Dominance        Extremity/Trunk Assessment   Upper Extremity Assessment: Overall WFL for tasks assessed           Lower Extremity Assessment: Overall WFL for tasks assessed      Cervical / Trunk Assessment: Normal  Communication   Communication: No difficulties  Cognition Arousal/Alertness: Lethargic;Suspect due to medications Behavior During Therapy: Flat affect Overall Cognitive Status: Impaired/Different from baseline Area of Impairment: Orientation;Attention;Safety/judgement;Awareness Orientation Level: Place;Time Current Attention Level: Sustained           General Comments: really lethargic, falling asleep  during conversation, closed eyes while ambulating.    General Comments      Exercises        Assessment/Plan    PT Assessment    PT Diagnosis Difficulty walking;Generalized weakness   PT Problem List    PT Treatment Interventions     PT Goals (Current goals can be found in the Care Plan section) Acute Rehab PT Goals Patient Stated  Goal: agreed to walk PT Goal Formulation: With patient Time For Goal Achievement: 08/27/14 Potential to Achieve Goals: Good    Frequency     Barriers to discharge        Co-evaluation               End of Session Equipment Utilized During Treatment: Gait belt Activity Tolerance: Patient limited by fatigue Patient left: in chair;with call bell/phone within reach;with chair alarm set Nurse Communication: Mobility status         Time: 0920-0940 PT Time Calculation (min) (ACUTE ONLY): 20 min   Charges:   PT Evaluation $Initial PT Evaluation Tier I: 1  Procedure PT Treatments $Gait Training: 8-22 mins   PT G Codes:        Rada Hay 08/13/2014, 10:13 AM Blanchard Kelch PT [phone]

## 2014-08-14 LAB — CBC
HEMATOCRIT: 35.4 % — AB (ref 39.0–52.0)
Hemoglobin: 12.2 g/dL — ABNORMAL LOW (ref 13.0–17.0)
MCH: 31.4 pg (ref 26.0–34.0)
MCHC: 34.5 g/dL (ref 30.0–36.0)
MCV: 91 fL (ref 78.0–100.0)
PLATELETS: 78 10*3/uL — AB (ref 150–400)
RBC: 3.89 MIL/uL — ABNORMAL LOW (ref 4.22–5.81)
RDW: 12.5 % (ref 11.5–15.5)
WBC: 6.4 10*3/uL (ref 4.0–10.5)

## 2014-08-14 LAB — HEMOGLOBIN A1C
HEMOGLOBIN A1C: 5.2 % (ref 4.8–5.6)
Mean Plasma Glucose: 103 mg/dL

## 2014-08-14 LAB — GLUCOSE, CAPILLARY: Glucose-Capillary: 136 mg/dL — ABNORMAL HIGH (ref 70–99)

## 2014-08-14 LAB — BASIC METABOLIC PANEL
Anion gap: 9 (ref 5–15)
BUN: 7 mg/dL (ref 6–23)
CO2: 25 mmol/L (ref 19–32)
Calcium: 8.1 mg/dL — ABNORMAL LOW (ref 8.4–10.5)
Chloride: 106 mmol/L (ref 96–112)
Creatinine, Ser: 0.81 mg/dL (ref 0.50–1.35)
GFR calc Af Amer: >90 mL/min (ref >90)
GFR calc non Af Amer: >90 mL/min (ref >90)
Glucose, Bld: 113 mg/dL — ABNORMAL HIGH (ref 70–99)
Potassium: 3.8 mmol/L (ref 3.5–5.1)
SODIUM: 140 mmol/L (ref 135–145)

## 2014-08-14 LAB — CLOSTRIDIUM DIFFICILE BY PCR: CDIFFPCR: NEGATIVE

## 2014-08-14 LAB — MAGNESIUM: MAGNESIUM: 1.7 mg/dL (ref 1.5–2.5)

## 2014-08-14 MED ORDER — LOPERAMIDE HCL 2 MG PO CAPS
2.0000 mg | ORAL_CAPSULE | ORAL | Status: DC | PRN
  Administered 2014-08-14: 2 mg via ORAL
  Filled 2014-08-14: qty 1

## 2014-08-14 NOTE — Progress Notes (Addendum)
Patient ID: Brent Gay, male   DOB: 04/10/1966, 49 y.o.   MRN: [id number]  [facility] PROGRESS NOTE  Brent Gay Brent Gay DOB: 01/05/1966 DOA: 08/12/2014 PCP: No PCP Per Patient   Brief narrative:    49 y.o. male hypertension, chronic back pain, alcohol abuse, gastroesophageal reflux disease, depression presented to the ED requesting detox from alcohol.  Assessment/Plan:    Principal Problem:   Alcohol withdrawal - pt with active tremors on exam this am, says its better - continue to keep on CIWA protocol  - continue IVF, MVI, thiamin and folate - supplement electrolytes as needed  Active Problems:   Alcohol abuse - cessation consultation provided and pt verbalized understanding    GAD (generalized anxiety disorder) - stable this AM   Sinus tachycardia - secondary to alcohol withdrawal - TSH is WNL - continue IVF and CIWA protocol monitoring - no chest pain this AM   Hypokalemia - supplemented and WNL - Mg supplemented as well - will repeat Mg and BMP in AM   Epididymal cyst - outpatient follow up recommended    Essential hypertension - reasonable inpatient control    Thrombocytopenia - drop in Plt since last week - stop Lovenox SQ and place on SCD's   DVT prophylaxis: D/C Lovenox SQ due to thrombocytopenia and place on SCD's 3/2  Code Status: Full.  Family Communication:  plan of care discussed with the patient Disposition Plan: Home when stable.   IV access:  Peripheral IV  Procedures and diagnostic studies:    X-ray Chest Pa And Lateral  08/12/2014  No active cardiopulmonary disease.    Dg Chest 2 View  08/07/2014  No edema or consolidation.   [place] Scrotum  08/12/2014   Normal testicles.  Normal blood flow to the testicles. 2. Large left epididymal cyst measuring up to 2.6 cm. While this is a benign finding, this may be the source of patient's pain. Recommend nonemergent urology consultation.     [place] Art/ven Flow Abd Pelv Doppler  08/12/2014 Normal  testicles.  Normal blood flow to the testicles. 2. Large left epididymal cyst measuring up to 2.6 cm. While this is a benign finding, this may be the source of patient's pain. Recommend nonemergent urology consultation.    Dg Chest Port 1 View  08/04/2014  Stable mild chronic bronchitic changes.  No acute abnormality.   Medical Consultants:  None   Other Consultants:  None   IAnti-Infectives:   None   Brent PrestoMAGICK-Brent Hosie, MD  [facility] Pager [phone]  If 7PM-7AM, please contact night-coverage [email or web address] Password [email or web address] 08/14/2014, 3:09 PM   LOS: 2 days   HPI/Subjective: No events overnight.   Objective: Filed Vitals:   08/14/14 0600 08/14/14 0747 08/14/14 1020 08/14/14 1437  BP: 147/89 145/86 150/82 143/85  Pulse: 87 88 90 102  Temp:    98 F (36.7 C)  TempSrc:    Oral  Resp:    20  Gay:      Weight:      SpO2:    98%    Intake/Output Summary (Last 24 hours) at 08/14/14 1509 Last data filed at 08/14/14 1437  Gross per 24 hour  Intake   3040 ml  Output   3250 ml  Net   -210 ml    Exam:   General:  Pt is alert, follows commands appropriately, not in acute distress  Cardiovascular: Regular rhythm, tachycardic, S1/S2, no murmurs, no rubs, no gallops  Respiratory: Clear to auscultation bilaterally, no wheezing,  no crackles, no rhonchi  Abdomen: Soft, non tender, non distended, bowel sounds present, no guarding  Extremities: No edema, pulses DP and PT palpable bilaterally  Neuro: resting tremors present   Data Reviewed: Basic Metabolic Panel:  Recent Labs Lab 08/12/14 1050 08/12/14 1552 08/13/14 0350 08/14/14 0555  NA 141  --  135 140  K 3.3*  --  3.3* 3.8  CL 102  --  103 106  CO2 20  --  26 25  GLUCOSE 102*  --  91 113*  BUN 12  --  8 7  CREATININE 0.89  --  0.77 0.81  CALCIUM 8.1*  --  7.4* 8.1*  MG 1.6  --  1.8 1.7  PHOS  --  2.7  --   --    Liver Function Tests:  Recent Labs Lab 08/12/14 1050 08/13/14 0350  AST 28 18  ALT 21 17   ALKPHOS 73 55  BILITOT 1.0 1.3*  PROT 7.2 5.6*  ALBUMIN 4.2 3.2*    Recent Labs Lab 08/12/14 1050  LIPASE 22   CBC:  Recent Labs Lab 08/12/14 1050 08/13/14 0350 08/14/14 0555  WBC 8.1 6.7 6.4  NEUTROABS 4.9  --   --   HGB 15.3 12.6* 12.2*  HCT 43.7 36.1* 35.4*  MCV 90.5 91.4 91.0  PLT 161 72* 78*   CBG:  Recent Labs Lab 08/13/14 0749 08/14/14 0821  GLUCAP 141* 136*    Recent Results (from the past 240 hour(s))  MRSA PCR Screening     Status: None   Collection Time: 08/12/14  3:39 PM  Result Value Ref Range Status   MRSA by PCR NEGATIVE NEGATIVE Final    Comment:        The GeneXpert MRSA Assay (FDA approved for NASAL specimens only), is one component of a comprehensive MRSA colonization surveillance program. It is not intended to diagnose MRSA infection nor to guide or monitor treatment for MRSA infections.   Clostridium Difficile by PCR     Status: None   Collection Time: 08/14/14 12:21 PM  Result Value Ref Range Status   C difficile by pcr NEGATIVE NEGATIVE Final     Scheduled Meds: . aspirin EC  325 mg Oral Daily  . docusate sodium  100 mg Oral BID  . enoxaparin (LOVENOX) injection  40 mg Subcutaneous Q24H  . folic acid  1 mg Intravenous Daily  . LORazepam  0-4 mg Intravenous Q6H   Followed by  . [START ON 08/15/2014] LORazepam  0-4 mg Intravenous Q12H  . metoprolol tartrate  12.5 mg Oral BID  . multivitamin with minerals  1 tablet Oral Daily  . pantoprazole  40 mg Oral Q0600  . sodium chloride  3 mL Intravenous Q12H  . thiamine  100 mg Oral Daily   Or  . thiamine  100 mg Intravenous Daily   Continuous Infusions: . sodium chloride 100 mL/hr at 08/14/14 1019

## 2014-08-15 LAB — GLUCOSE, CAPILLARY: Glucose-Capillary: 108 mg/dL — ABNORMAL HIGH (ref 70–99)

## 2014-08-15 LAB — CBC
HCT: 38.4 % — ABNORMAL LOW (ref 39.0–52.0)
HEMOGLOBIN: 13 g/dL (ref 13.0–17.0)
MCH: 31.4 pg (ref 26.0–34.0)
MCHC: 33.9 g/dL (ref 30.0–36.0)
MCV: 92.8 fL (ref 78.0–100.0)
Platelets: 106 10*3/uL — ABNORMAL LOW (ref 150–400)
RBC: 4.14 MIL/uL — AB (ref 4.22–5.81)
RDW: 12.5 % (ref 11.5–15.5)
WBC: 7.8 10*3/uL (ref 4.0–10.5)

## 2014-08-15 LAB — BASIC METABOLIC PANEL
Anion gap: 8 (ref 5–15)
BUN: 8 mg/dL (ref 6–23)
CHLORIDE: 100 mmol/L (ref 96–112)
CO2: 29 mmol/L (ref 19–32)
Calcium: 8.9 mg/dL (ref 8.4–10.5)
Creatinine, Ser: 0.86 mg/dL (ref 0.50–1.35)
GFR calc non Af Amer: >90 mL/min (ref >90)
Glucose, Bld: 91 mg/dL (ref 70–99)
POTASSIUM: 4 mmol/L (ref 3.5–5.1)
Sodium: 137 mmol/L (ref 135–145)

## 2014-08-15 LAB — MAGNESIUM: Magnesium: 1.6 mg/dL (ref 1.5–2.5)

## 2014-08-15 MED ORDER — MAGNESIUM SULFATE 2 GM/50ML IV SOLN
2.0000 g | Freq: Once | INTRAVENOUS | Status: AC
  Administered 2014-08-15: 2 g via INTRAVENOUS
  Filled 2014-08-15: qty 50

## 2014-08-15 MED ORDER — TRAZODONE HCL 50 MG PO TABS
50.0000 mg | ORAL_TABLET | Freq: Once | ORAL | Status: AC
  Administered 2014-08-15: 50 mg via ORAL
  Filled 2014-08-15: qty 1

## 2014-08-15 NOTE — Progress Notes (Signed)
Patient ID: Brent Gay, male   DOB: 22-Apr-1966, 49 y.o.   MRN: [id number] [facility] PROGRESS NOTE  Brent Gay DOB: 11/22/1965 DOA: 08/12/2014 PCP: No PCP Per Patient   Brief narrative:    49 y.o. male hypertension, chronic back pain, alcohol abuse, gastroesophageal reflux disease, depression presented to the ED requesting detox from alcohol.  Assessment/Plan:    Principal Problem:   Alcohol withdrawal - Tremors still present on exam this morning but much better compared to yesterday - continue to keep on CIWA protocol  - continue IVF, MVI, thiamin and folate - Electrolytes are stable Active Problems:   Alcohol abuse - cessation consultation provided and pt verbalized understanding    GAD (generalized anxiety disorder) - stable this AM   Sinus tachycardia - secondary to alcohol withdrawal, heart rate in 70s this morning - TSH is WNL - continue IVF and CIWA protocol monitoring - no chest pain this AM   Hypokalemia - supplemented and WNL - Mg supplemented as well but still on low side of normal, we'll give additional dose of mag IV   Epididymal cyst - outpatient follow up recommended  - Patient made aware of the necessary appointment   Essential hypertension - reasonable inpatient control    Thrombocytopenia - Platelet counts improving  DVT prophylaxis: D/C Lovenox SQ due to thrombocytopenia and place on SCD's 3/2  Code Status: Full.  Family Communication:  plan of care discussed with the patient Disposition Plan: Home when stable.   IV access:  Peripheral IV  Procedures and diagnostic studies:    X-ray Chest Pa And Lateral  08/12/2014  No active cardiopulmonary disease.    Dg Chest 2 View  08/07/2014  No edema or consolidation.   US Scrotum  08/12/2014   Normal testicles.  Normal blood flow to the testicles. 2. Large left epididymal cyst measuring up to 2.6 cm. While this is a benign finding, this may be the source of patient's pain. Recommend  nonemergent urology consultation.     [place] Art/ven Flow Abd Pelv Doppler  08/12/2014 Normal testicles.  Normal blood flow to the testicles. 2. Large left epididymal cyst measuring up to 2.6 cm. While this is a benign finding, this may be the source of patient's pain. Recommend nonemergent urology consultation.    Dg Chest Port 1 View  08/04/2014  Stable mild chronic bronchitic changes.  No acute abnormality.   Medical Consultants:  None   Other Consultants:  None   IAnti-Infectives:   None   Debbora Presto, MD  [facility] Pager [phone]  If 7PM-7AM, please contact night-coverage [email or web address] Password [email or web address] 08/15/2014, 10:42 AM   LOS: 3 days   HPI/Subjective: No events overnight.   Objective: Filed Vitals:   08/14/14 1859 08/14/14 2218 08/15/14 0612 08/15/14 0703  BP:  154/92 158/106 150/93  Pulse: 94 84 75   Temp:  98.2 F (36.8 C) 98.2 F (36.8 C)   TempSrc:  Oral Oral   Resp:  20 20   Height:      Weight:   95.1 kg (209 lb 10.5 oz)   SpO2:  98% 100%     Intake/Output Summary (Last 24 hours) at 08/15/14 1042 Last data filed at 08/15/14 0829  Gross per 24 hour  Intake    720 ml  Output   3100 ml  Net  -2380 ml    Exam:   General:  Pt is alert, follows commands appropriately, not in acute distress  Cardiovascular: Regular rate and  rhythm, S1/S2, no murmurs, no rubs, no gallops  Respiratory: Clear to auscultation bilaterally, no wheezing, no crackles, no rhonchi  Abdomen: Soft, non tender, non distended, bowel sounds present, no guarding  Extremities: No edema, pulses DP and PT palpable bilaterally  Neuro: Mild tremors at rest and with movement noted but significantly better compared to yesterday  Data Reviewed: Basic Metabolic Panel:  Recent Labs Lab 08/12/14 1050 08/12/14 1552 08/13/14 0350 08/14/14 0555 08/15/14 0606  NA 141  --  135 140 137  K 3.3*  --  3.3* 3.8 4.0  CL 102  --  103 106 100  CO2 20  --  26 25 29   GLUCOSE 102*  --  91 113* 91   BUN 12  --  8 7 8   CREATININE 0.89  --  0.77 0.81 0.86  CALCIUM 8.1*  --  7.4* 8.1* 8.9  MG 1.6  --  1.8 1.7 1.6  PHOS  --  2.7  --   --   --    Liver Function Tests:  Recent Labs Lab 08/12/14 1050 08/13/14 0350  AST 28 18  ALT 21 17  ALKPHOS 73 55  BILITOT 1.0 1.3*  PROT 7.2 5.6*  ALBUMIN 4.2 3.2*    Recent Labs Lab 08/12/14 1050  LIPASE 22   CBC:  Recent Labs Lab 08/12/14 1050 08/13/14 0350 08/14/14 0555 08/15/14 0606  WBC 8.1 6.7 6.4 7.8  NEUTROABS 4.9  --   --   --   HGB 15.3 12.6* 12.2* 13.0  HCT 43.7 36.1* 35.4* 38.4*  MCV 90.5 91.4 91.0 92.8  PLT 161 72* 78* 106*   CBG:  Recent Labs Lab 08/13/14 0749 08/14/14 0821 08/15/14 0726  GLUCAP 141* 136* 108*    Recent Results (from the past 240 hour(s))  MRSA PCR Screening     Status: None   Collection Time: 08/12/14  3:39 PM  Result Value Ref Range Status   MRSA by PCR NEGATIVE NEGATIVE Final    Comment:        The GeneXpert MRSA Assay (FDA approved for NASAL specimens only), is one component of a comprehensive MRSA colonization surveillance program. It is not intended to diagnose MRSA infection nor to guide or monitor treatment for MRSA infections.   Clostridium Difficile by PCR     Status: None   Collection Time: 08/14/14 12:21 PM  Result Value Ref Range Status   C difficile by pcr NEGATIVE NEGATIVE Final     Scheduled Meds: . aspirin EC  325 mg Oral Daily  . folic acid  1 mg Intravenous Daily  . LORazepam  0-4 mg Intravenous Q6H   Followed by  . LORazepam  0-4 mg Intravenous Q12H  . metoprolol tartrate  12.5 mg Oral BID  . multivitamin with minerals  1 tablet Oral Daily  . pantoprazole  40 mg Oral Q0600  . sodium chloride  3 mL Intravenous Q12H  . thiamine  100 mg Oral Daily   Or  . thiamine  100 mg Intravenous Daily   Continuous Infusions: . sodium chloride 75 mL/hr at 08/14/14 2002

## 2014-08-15 NOTE — Progress Notes (Signed)
Clinical Social Work Department BRIEF PSYCHOSOCIAL ASSESSMENT 08/15/2014  Patient:  Brent Gay, Brent Gay     Account Number:  [id number]     Admit date:  08/12/2014  Clinical Social Worker:  Glorious Peach, [place]  Date/Time:  08/15/2014 11:14 AM  Referred by:  Physician  Date Referred:  08/15/2014 Referred for  Substance Abuse   Other Referral:   Interview type:  Patient Other interview type:    PSYCHOSOCIAL DATA Living Status:  ALONE Admitted from facility:   Level of care:   Primary support name:  Joyce Copa Primary support relationship to patient:  SIBLING Degree of support available:   Adequate    CURRENT CONCERNS Current Concerns  Substance Abuse   Other Concerns:    SOCIAL WORK ASSESSMENT / PLAN CSW received consult from MD.   Assessment/plan status:  Information/Referral to [employer] Other assessment/ plan:   Information/referral to community resources:   CSW to assess patient and needs regarding substance abuse. CSW to refer patient to needed resources.    PATIENT'S/FAMILY'S RESPONSE TO PLAN OF CARE: CSW met with patient at bedside. Introduced self and explained role. CSW inquired about patient reason for being in the hospital. Patient stated that he has a problem with alcohol and that is the reason he is at [facility]. CSW spoke briefly about living situation and patient stated that he lives in a hotel alone majority of the time. CSW begin to talk about patient's alcohol situation and pondered with patient on ways for patient to get better. CSW completed SBRIT tool which patient scored a total of 36, which is high risk. CSW and patient discuss treatment options. Patient states that he has been to [facility] for outpatient treatment but will try for a residential treatment because he knows that he needs more help. CSW left patient with calendars for AA meetings, which patient states have been beneficial in the past. CSW also gave patient resources for  outpatient treatment and residential treatment options that patient can look into. CSW is signing off but if needed can be contacted at [phone].       Glorious Peach BSW Intern

## 2014-08-15 NOTE — Progress Notes (Signed)
Physical Therapy Treatment Patient Details Name: Brent Gay MRN: [id number] DOB: 04/25/1966 Today's Date: 08/15/2014    History of Present Illness 49 yo male admitted 2/29 with  relapse ETOH, AH?VH, nausea, vomiting and diarrhea.     PT Comments    Pt in bathroom on arrival.  Very unsteady.  Assisted with balance as pt performed self hygiene.  amb in hallway with pt pushing IV pole however required assist to prevent tripping.  Staggered/drunken gait.  HIGH FALL RISK.  Assisted back to bed per pt request to take a nap.    Follow Up Recommendations  No PT follow up     Equipment Recommendations  None recommended by PT    Recommendations for Other Services       Precautions / Restrictions Precautions Precautions: Fall Restrictions Weight Bearing Restrictions: No    Mobility  Bed Mobility Overal bed mobility: Modified Independent Bed Mobility: Supine to Sit           General bed mobility comments: impulsive.  assist with safety of IV line.  Transfers Overall transfer level: Needs assistance Equipment used: None Transfers: Sit to/from Stand Sit to Stand: Min guard;Min assist         General transfer comment: 75% VC's on safety/impulsive  Ambulation/Gait Ambulation/Gait assistance: Min guard;Min assist Ambulation Distance (Feet): 450 Feet Assistive device: None (holding to IV pole) Gait Pattern/deviations: Step-through pattern;Staggering right;Staggering left;Wide base of support     General Gait Details: amb while pushing IV pole with caution to safety issues of tripping.  very unsteady drunken gait.  impaired safety cognition.  HIGH FALL RISK.     Stairs            Wheelchair Mobility    Modified Rankin (Stroke Patients Only)       Balance           Standing balance support: No upper extremity supported   Standing balance comment: min guard for safety                    Cognition Arousal/Alertness: Awake/alert Behavior During  Therapy: Impulsive Overall Cognitive Status: Impaired/Different from baseline Area of Impairment: Orientation;Attention;Safety/judgement;Awareness   Current Attention Level: Sustained     Safety/Judgement: Decreased awareness of safety;Decreased awareness of deficits     General Comments: impaited safety cognition.  Impulsive.     Exercises      General Comments        Pertinent Vitals/Pain Pain Assessment: Faces Faces Pain Scale: Hurts little more Pain Location: back of head Pain Descriptors / Indicators: Aching;Constant Pain Intervention(s): RN gave pain meds during session    Home Living                      Prior Function            PT Goals (current goals can now be found in the care plan section) Progress towards PT goals: Progressing toward goals    Frequency  Min 3X/week    PT Plan      Co-evaluation             End of Session Equipment Utilized During Treatment: Gait belt Activity Tolerance: Treatment limited secondary to medical complications (Comment) Patient left: in bed;with call bell/phone within reach;with bed alarm set     Time: 1235-1300 PT Time Calculation (min) (ACUTE ONLY): 25 min  Charges:  $Gait Training: 8-22 mins $Therapeutic Activity: 8-22 mins  G Codes:      Rica Koyanagi  PTA [facility]  Acute  Rehab Pager      [phone]

## 2014-08-15 NOTE — Progress Notes (Signed)
Occupational Therapy Treatment Patient Details Name: Brent HeightJohn F Gay MRN: [id number] DOB: 10/07/1965 Today's Date: 08/15/2014    History of present illness 49 yo male admitted 2/29 with  relapse ETOH, AH?VH, nausea, vomiting and diarrhea.    OT comments  Pt more awake this session.  Still with decreased safety judgment and impulsivity  Follow Up Recommendations  Supervision/Assistance - 24 hour    Equipment Recommendations  None recommended by OT    Recommendations for Other Services      Precautions / Restrictions Precautions Precautions: Fall       Mobility Bed Mobility Overal bed mobility: Modified Independent                Transfers   Equipment used: Rolling walker (2 wheeled);None Transfers: Sit to/from Stand Sit to Stand: Min guard         General transfer comment: cues for UE placement    Balance           Standing balance support: No upper extremity supported   Standing balance comment: min guard for safety                   ADL       Grooming: Wash/dry hands;Wash/dry face;Min guard;Standing   Upper Body Bathing: Min guard;Standing   Lower Body Bathing: Minimal assistance;Sit to/from stand   Upper Body Dressing : Min guard;Standing                     General ADL Comments: pt ambulated to bathroom with min guard; however, min A needed for walk back as pt lost balance and required support to correct.  Pt with decreased safety judgment during adl and ambulating:  picked up walker getting through door, but LOB was when he was making turn to get back to bed. Completed ADL mostly from standing.  Stood and urinated also with min guard      Emergency planning/management officerVision                     Perception     Praxis      Cognition   Behavior During Therapy: [facility] AntonioWFL for tasks assessed/performed Overall Cognitive Status: Impaired/Different from baseline            Safety/Judgement: Decreased awareness of safety;Decreased awareness of  deficits     General Comments: decreased safety awareness during ADL; still had some confusion, taking gown off/leads off. When OT arrived, IV was out--may have been tangled in tele monitor.  Impulsive at times    Extremity/Trunk Assessment               Exercises     Shoulder Instructions       General Comments      Pertinent Vitals/ Pain       Faces Pain Scale: Hurts little more Pain Location: back Pain Descriptors / Indicators: Aching Pain Intervention(s): Limited activity within patient's tolerance;Monitored during session;Repositioned  Home Living                                          Prior Functioning/Environment              Frequency Min 2X/week     Progress Toward Goals  OT Goals(current goals can now be found in the care plan section)  Progress towards OT goals: Progressing toward goals  Plan      Co-evaluation                 End of Session     Activity Tolerance Patient limited by fatigue   Patient Left in bed;with call bell/phone within reach;with bed alarm set   Nurse Communication  (pt back in bed (for IV to be restarted))        Time: 1610-9604 OT Time Calculation (min): 17 min  Charges: OT General Charges $OT Visit: 1 Procedure OT Treatments $Self Care/Home Management : 8-22 mins  Jette Lewan 08/15/2014, 12:04 PM  Marica Otter, OTR/L [phone] 08/15/2014

## 2014-08-16 LAB — GLUCOSE, CAPILLARY: GLUCOSE-CAPILLARY: 116 mg/dL — AB (ref 70–99)

## 2014-08-16 MED ORDER — FOLIC ACID 1 MG PO TABS
1.0000 mg | ORAL_TABLET | Freq: Every day | ORAL | Status: DC
  Administered 2014-08-17: 1 mg via ORAL
  Filled 2014-08-16 (×2): qty 1

## 2014-08-16 MED ORDER — LORAZEPAM 1 MG PO TABS
1.0000 mg | ORAL_TABLET | Freq: Two times a day (BID) | ORAL | Status: DC | PRN
  Administered 2014-08-16 – 2014-08-17 (×2): 1 mg via ORAL
  Filled 2014-08-16 (×2): qty 1

## 2014-08-16 NOTE — Progress Notes (Signed)
Patient ID: Brent Gay, male   DOB: [birth year], 49 y.o.   MRN: [id number]  [facility] PROGRESS NOTE  [name] WUJ:[id number] DOB: 1966-02-20 DOA: 08/12/2014 PCP: No PCP Per Patient   Brief narrative:    49 y.o. male hypertension, chronic back pain, alcohol abuse, gastroesophageal reflux disease, depression presented to the ED requesting detox from alcohol.  Assessment/Plan:    Principal Problem:  Alcohol withdrawal - Tremors still present on exam this morning but much better compared to yesterday - continue to keep on CIWA protocol and may add additional doses of Ativan if needed - continue MVI, thiamin and folate - Electrolytes are stable - Discontinue IV fluids Active Problems:  Alcohol abuse - cessation consultation provided   GAD (generalized anxiety disorder) - stable this AM  Sinus tachycardia - secondary to alcohol withdrawal, heart rate in 70s this morning - TSH is WNL - Resolved  Hypokalemia - supplemented and WNL - Mg supplemented as well   Epididymal cyst - outpatient follow up recommended  - Patient made aware of the necessary appointment  Essential hypertension - reasonable inpatient control   Thrombocytopenia - Platelet counts improving  DVT prophylaxis: D/C Lovenox SQ due to thrombocytopenia and place on SCD's 3/2  Code Status: Full.  Family Communication: plan of care discussed with the patient Disposition Plan: Home in the morning  IV access:  Peripheral IV  Procedures and diagnostic studies:    X-ray Chest Pa And Lateral 08/12/2014 No active cardiopulmonary disease.   Dg Chest 2 View 08/07/2014 No edema or consolidation.   US Scrotum 08/12/2014 Normal testicles. Normal blood flow to the testicles. 2. Large left epididymal cyst measuring up to 2.6 cm. While this is a benign finding, this may be the source of patient's pain. Recommend nonemergent urology consultation.   [place] Art/ven Flow Abd Pelv Doppler 08/12/2014  Normal testicles. Normal blood flow to the testicles. 2. Large left epididymal cyst measuring up to 2.6 cm. While this is a benign finding, this may be the source of patient's pain. Recommend nonemergent urology consultation.   Dg Chest Port 1 View 08/04/2014 Stable mild chronic bronchitic changes. No acute abnormality.   Medical Consultants:  None  Other Consultants:  PT  IAnti-Infectives:   None  Debbora Presto, MD  [facility] Pager [phone]  If 7PM-7AM, please contact night-coverage [email or web address] Password [email or web address] 08/16/2014, 11:08 AM   LOS: 4 days   HPI/Subjective: No events overnight.   Objective: Filed Vitals:   08/15/14 0703 08/15/14 1319 08/15/14 2059 08/16/14 0556  BP: 150/93 140/92 159/99 141/89  Pulse:  67 96 91  Temp:  98 F (36.7 C) 98.2 F (36.8 C) 97.7 F (36.5 C)  TempSrc:  Oral Oral Oral  Resp:  Height:      Weight:    100 kg (220 lb 7.4 oz)  SpO2:  98% 98% 96%    Intake/Output Summary (Last 24 hours) at 08/16/14 1108 Last data filed at 08/16/14 0200  Gross per 24 hour  Intake  987.5 ml  Output      3 ml  Net  984.5 ml    Exam:   General:  Pt is alert, follows commands appropriately, not in acute distress  Cardiovascular: Regular rate and rhythm, S1/S2, no murmurs, no rubs, no gallops  Respiratory: Clear to auscultation bilaterally, no wheezing, no crackles, no rhonchi  Extremities: No edema, pulses DP and PT palpable bilaterally  Neuro: Tremors present at rest and with movement, otherwise nonfocal  Data Reviewed: Basic Metabolic Panel:  Recent Labs Lab 08/12/14 1050 08/12/14 1552 08/13/14 0350 08/14/14 0555 08/15/14 0606  NA 141  --  135 140 137  K 3.3*  --  3.3* 3.8 4.0  CL 102  --  103 106 100  CO2 20  --  26 25 29   GLUCOSE 102*  --  91 113* 91  BUN 12  --  8 7 8   CREATININE 0.89  --  0.77 0.81 0.86  CALCIUM 8.1*  --  7.4* 8.1* 8.9  MG 1.6  --  1.8 1.7 1.6  PHOS  --  2.7  --   --   --    Liver Function  Tests:  Recent Labs Lab 08/12/14 1050 08/13/14 0350  AST 28 18  ALT 21 17  ALKPHOS 73 55  BILITOT 1.0 1.3*  PROT 7.2 5.6*  ALBUMIN 4.2 3.2*    Recent Labs Lab 08/12/14 1050  LIPASE 22   CBC:  Recent Labs Lab 08/12/14 1050 08/13/14 0350 08/14/14 0555 08/15/14 0606  WBC 8.1 6.7 6.4 7.8  NEUTROABS 4.9  --   --   --   HGB 15.3 12.6* 12.2* 13.0  HCT 43.7 36.1* 35.4* 38.4*  MCV 90.5 91.4 91.0 92.8  PLT 161 72* 78* 106*   CBG:  Recent Labs Lab 08/13/14 0749 08/14/14 0821 08/15/14 0726 08/16/14 0735  GLUCAP 141* 136* 108* 116*    Recent Results (from the past 240 hour(s))  MRSA PCR Screening     Status: None   Collection Time: 08/12/14  3:39 PM  Result Value Ref Range Status   MRSA by PCR NEGATIVE NEGATIVE Final  Clostridium Difficile by PCR     Status: None   Collection Time: 08/14/14 12:21 PM  Result Value Ref Range Status   C difficile by pcr NEGATIVE NEGATIVE Final     Scheduled Meds: . aspirin EC  325 mg Oral Daily  . [START ON 08/17/2014] folic acid  1 mg Oral Daily  . metoprolol tartrate  12.5 mg Oral BID  . multivitamin with minerals  1 tablet Oral Daily  . pantoprazole  40 mg Oral Q0600  . sodium chloride  3 mL Intravenous Q12H  . thiamine  100 mg Oral Daily   Or  . thiamine  100 mg Intravenous Daily   Continuous Infusions: . sodium chloride 50 mL/hr at 08/16/14 [id number]

## 2014-08-16 NOTE — Progress Notes (Signed)
Physical Therapy Treatment Patient Details Name: Brent Gay MRN: [id number] DOB: 09/11/1965 Today's Date: 08/16/2014    History of Present Illness 49 yo male admitted 2/29 with  relapse ETOH, AH?VH, nausea, vomiting and diarrhea.     PT Comments    Pt holding to IV pole amb around room by himself on arrival.  Assisted with amb a great distance in hallway with out pt holding to poll to access gait/balance.    Follow Up Recommendations  No PT follow up     Equipment Recommendations  None recommended by PT    Recommendations for Other Services       Precautions / Restrictions      Mobility  Bed Mobility               General bed mobility comments: pt amb self in room on arrival  Transfers Overall transfer level: Needs assistance Equipment used: None   Sit to Stand: Supervision         General transfer comment: 75% VC's on safety/impulsive.  caution to environment hazzards(IV pole/cords/furniture)  Ambulation/Gait Ambulation/Gait assistance: Min guard;Supervision Ambulation Distance (Feet): 750 Feet Assistive device: None Gait Pattern/deviations: Step-to pattern;Step-through pattern;Staggering left;Staggering right Gait velocity: WFL   General Gait Details: amb without any AD and without holding to IV pole.  Mild unsteady/drunken gait.  Impulsive.  Decreased safety awareness to environment.  still a fall risk.    Stairs            Wheelchair Mobility    Modified Rankin (Stroke Patients Only)       Balance                                    Cognition                            Exercises      General Comments        Pertinent Vitals/Pain      Home Living                      Prior Function            PT Goals (current goals can now be found in the care plan section) Progress towards PT goals: Progressing toward goals    Frequency  Min 3X/week    PT Plan      Co-evaluation             End of Session Equipment Utilized During Treatment: Gait belt Activity Tolerance: Patient tolerated treatment well Patient left: in bed;with call bell/phone within reach     Time: 1100-1110 PT Time Calculation (min) (ACUTE ONLY): 10 min  Charges:  $Gait Training: 8-22 mins                    G Codes:      Brent ShellingLori Sigurd Gay  PTA [facility]  Acute  Rehab Pager      [phone]

## 2014-08-17 LAB — CBC
HEMATOCRIT: 40.7 % (ref 39.0–52.0)
HEMOGLOBIN: 13.7 g/dL (ref 13.0–17.0)
MCH: 31.5 pg (ref 26.0–34.0)
MCHC: 33.7 g/dL (ref 30.0–36.0)
MCV: 93.6 fL (ref 78.0–100.0)
PLATELETS: DECREASED 10*3/uL (ref 150–400)
RBC: 4.35 MIL/uL (ref 4.22–5.81)
RDW: 12.8 % (ref 11.5–15.5)
WBC: 7.3 10*3/uL (ref 4.0–10.5)

## 2014-08-17 LAB — BASIC METABOLIC PANEL
ANION GAP: 7 (ref 5–15)
BUN: 10 mg/dL (ref 6–23)
CO2: 32 mmol/L (ref 19–32)
CREATININE: 0.92 mg/dL (ref 0.50–1.35)
Calcium: 9.4 mg/dL (ref 8.4–10.5)
Chloride: 99 mmol/L (ref 96–112)
GLUCOSE: 103 mg/dL — AB (ref 70–99)
Potassium: 5.3 mmol/L — ABNORMAL HIGH (ref 3.5–5.1)
Sodium: 138 mmol/L (ref 135–145)

## 2014-08-17 LAB — GLUCOSE, CAPILLARY: GLUCOSE-CAPILLARY: 109 mg/dL — AB (ref 70–99)

## 2014-08-17 MED ORDER — LORAZEPAM 1 MG PO TABS: 1.0000 mg | ORAL_TABLET | Freq: Four times a day (QID) | ORAL | Status: DC | PRN

## 2014-08-17 MED ORDER — IBUPROFEN 200 MG PO TABS: 400.0000 mg | ORAL_TABLET | ORAL | Status: DC | PRN

## 2014-08-17 MED ORDER — HYDROXYZINE HCL 50 MG PO TABS: 50.0000 mg | ORAL_TABLET | Freq: Four times a day (QID) | ORAL | Status: DC | PRN

## 2014-08-17 MED ORDER — LORAZEPAM 1 MG PO TABS: 1.0000 mg | ORAL_TABLET | Freq: Two times a day (BID) | ORAL | Status: DC | PRN

## 2014-08-17 MED ORDER — OXYCODONE HCL 5 MG PO TABS: 5.0000 mg | ORAL_TABLET | ORAL | Status: DC | PRN

## 2014-08-17 MED ORDER — OXYCODONE HCL 10 MG PO TABS: 10.0000 mg | ORAL_TABLET | ORAL | Status: DC | PRN

## 2014-08-17 MED ORDER — TRAZODONE HCL 100 MG PO TABS: 100.0000 mg | ORAL_TABLET | Freq: Every evening | ORAL | Status: AC | PRN

## 2014-08-17 NOTE — Discharge Summary (Signed)
Physician Discharge Summary  Brent Gay ZOX:[id number] DOB: 06/11/1966 DOA: 08/12/2014  PCP: No PCP Per Patient  Admit date: 08/12/2014 Discharge date: 08/17/2014  Recommendations for Outpatient Follow-up:  1. Pt will need to follow up with PCP in 2-3 weeks post discharge 2. Please obtain BMP to evaluate electrolytes and kidney function 3. Please also check CBC to evaluate Hg and Hct levels  Discharge Diagnoses:  Principal Problem:   Alcohol withdrawal Active Problems:   Alcohol abuse   GAD (generalized anxiety disorder)   Chronic pain syndrome   Alcohol use disorder, severe, dependence   Sinus tachycardia   Hypokalemia   Epididymal cyst   Essential hypertension   Discharge Condition: Stable  Diet recommendation: Heart healthy diet discussed in details     Brief narrative:    49 y.o. male hypertension, chronic back pain, alcohol abuse, gastroesophageal reflux disease, depression presented to the ED requesting detox from alcohol.  Assessment/Plan:    Principal Problem:  Alcohol withdrawal - resolved, pt stable for discharge  - Electrolytes are stable Active Problems:  Alcohol abuse - cessation consultation provided   GAD (generalized anxiety disorder) - stable this AM  Sinus tachycardia - secondary to alcohol withdrawal, heart rate in 70s this morning - TSH is WNL - Resolved  Hypokalemia - supplemented and WNL - Mg supplemented as well   Epididymal cyst - outpatient follow up recommended  - Patient made aware of the necessary appointment  Essential hypertension - reasonable inpatient control   Thrombocytopenia - Platelet counts improving  Code Status: Full.  Family Communication: plan of care discussed with the patient Disposition Plan: Home  IV access:  Peripheral IV  Procedures and diagnostic studies:   X-ray Chest Pa And Lateral 08/12/2014 No active cardiopulmonary disease.   Dg Chest 2 View 08/07/2014 No edema or  consolidation.   US Scrotum 08/12/2014 Normal testicles. Normal blood flow to the testicles. 2. Large left epididymal cyst measuring up to 2.6 cm. While this is a benign finding, this may be the source of patient's pain. Recommend nonemergent urology consultation.   [place] Art/ven Flow Abd Pelv Doppler 08/12/2014 Normal testicles. Normal blood flow to the testicles. 2. Large left epididymal cyst measuring up to 2.6 cm. While this is a benign finding, this may be the source of patient's pain. Recommend nonemergent urology consultation.   Dg Chest Port 1 View 08/04/2014 Stable mild chronic bronchitic changes. No acute abnormality.   Medical Consultants:  None  Other Consultants:  PT  IAnti-Infectives:   None       Procedures/Studies:  Discharge Exam: Filed Vitals:   08/17/14 0633  BP: 130/90  Pulse: 76  Temp: 98.4 F (36.9 C)  Resp: 20   Filed Vitals:   08/16/14 0556 08/16/14 1517 08/16/14 2200 08/17/14 0633  BP: 141/89 158/78 152/91 130/90  Pulse: 91 79 91 76  Temp: 97.7 F (36.5 C) 98.7 F (37.1 C) 98.4 F (36.9 C) 98.4 F (36.9 C)  TempSrc: Oral Oral Oral Oral  Resp: Height:      Weight: 100 kg (220 lb 7.4 oz)     SpO2: 96% 95% 98% 95%    General: Pt is alert, follows commands appropriately, not in acute distress Cardiovascular: Regular rate and rhythm, S1/S2 +, no murmurs, no rubs, no gallops Respiratory: Clear to auscultation bilaterally, no wheezing, no crackles, no rhonchi Abdominal: Soft, non tender, non distended, bowel sounds +, no guarding Extremities: no edema, no cyanosis, pulses palpable bilaterally DP  and PT Neuro: Grossly nonfocal  Discharge Instructions  Discharge Instructions    Diet - low sodium heart healthy    Complete by:  As directed      Increase activity slowly    Complete by:  As directed             Medication List    STOP taking these medications        IBUPROFEN PM PO      TAKE these  medications        aspirin EC 325 MG tablet  Take 325 mg by mouth daily.     hydrOXYzine 25 MG tablet  Commonly known as:  ATARAX/VISTARIL  Take 1 tablet (25 mg total) by mouth every 6 (six) hours as needed for anxiety.     ibuprofen 200 MG tablet  Commonly known as:  ADVIL,MOTRIN  Take 2 tablets (400 mg total) by mouth every 4 (four) hours as needed for fever or moderate pain.     LORazepam 1 MG tablet  Commonly known as:  ATIVAN  Take 1 tablet (1 mg total) by mouth 2 (two) times daily as needed for anxiety (allow additional dose if worsening anxiety).     metoprolol tartrate 12.5 mg Tabs tablet  Commonly known as:  LOPRESSOR  Take 0.5 tablets (12.5 mg total) by mouth 2 (two) times daily.     multivitamin with minerals Tabs tablet  Take 1 tablet by mouth daily.     omeprazole 20 MG capsule  Commonly known as:  PRILOSEC  Take 1 capsule (20 mg total) by mouth daily.     oxyCODONE 5 MG immediate release tablet  Commonly known as:  Oxy IR/ROXICODONE  Take 1 tablet (5 mg total) by mouth every 4 (four) hours as needed for moderate pain, severe pain or breakthrough pain.     thiamine 100 MG tablet  Take 1 tablet (100 mg total) by mouth daily.           Follow-up Information    Follow up with [facility]   Contact information:   [address]  [place] [address] [place] [id number] [phone]       Follow up with [facility]   Contact information:   [address] [place] [phone]       The results of significant diagnostics from this hospitalization (including imaging, microbiology, ancillary and laboratory) are listed below for reference.     Microbiology: Recent Results (from the past 240 hour(s))  MRSA PCR Screening     Status: None   Collection Time: 08/12/14  3:39 PM  Result Value Ref Range Status   MRSA by PCR NEGATIVE NEGATIVE Final    Comment:        The GeneXpert MRSA Assay  (FDA approved for NASAL specimens only), is one component of a comprehensive MRSA colonization surveillance program. It is not intended to diagnose MRSA infection nor to guide or monitor treatment for MRSA infections.   Clostridium Difficile by PCR     Status: None   Collection Time: 08/14/14 12:21 PM  Result Value Ref Range Status   C difficile by pcr NEGATIVE NEGATIVE Final     Labs: Basic Metabolic Panel:  Recent Labs Lab 08/12/14 1050 08/12/14 1552 08/13/14 0350 08/14/14 0555 08/15/14 0606 08/17/14 0536  NA 141  --  135 140 137 138  K 3.3*  --  3.3* 3.8 4.0 5.3*  CL 102  --  103 106 100 99  CO2 20  --  32  GLUCOSE 102*  --  91 113* 91 103*  BUN 12  --  CREATININE 0.89  --  0.77 0.81 0.86 0.92  CALCIUM 8.1*  --  7.4* 8.1* 8.9 9.4  MG 1.6  --  1.8 1.7 1.6  --   PHOS  --  2.7  --   --   --   --    Liver Function Tests:  Recent Labs Lab 08/12/14 1050 08/13/14 0350  AST 28 18  ALT 21 17  ALKPHOS 73 55  BILITOT 1.0 1.3*  PROT 7.2 5.6*  ALBUMIN 4.2 3.2*    Recent Labs Lab 08/12/14 1050  LIPASE 22   CBC:  Recent Labs Lab 08/12/14 1050 08/13/14 0350 08/14/14 0555 08/15/14 0606 08/17/14 0536  WBC 8.1 6.7 6.4 7.8 7.3  NEUTROABS 4.9  --   --   --   --   HGB 15.3 12.6* 12.2* 13.0 13.7  HCT 43.7 36.1* 35.4* 38.4* 40.7  MCV 90.5 91.4 91.0 92.8 93.6  PLT 161 72* 78* 106* PLATELET CLUMPS NOTED ON SMEAR, COUNT APPEARS DECREASED   CBG:  Recent Labs Lab 08/13/14 0749 08/14/14 0821 08/15/14 0726 08/16/14 0735 08/17/14 0736  GLUCAP 141* 136* 108* 116* 109*   SIGNED: Time coordinating discharge: Over 30 minutes  MAGICK-Nathalee Smarr, MD  [facility] 08/17/2014, 10:13 AM Pager [phone]  If 7PM-7AM, please contact night-coverage [email or web address] Password [email or web address]

## 2014-08-17 NOTE — Discharge Instructions (Signed)
Alcohol Withdrawal °Alcohol withdrawal happens when you normally drink alcohol a lot and suddenly stop drinking. Alcohol withdrawal symptoms can be mild to very bad. Mild withdrawal symptoms can include feeling sick to your stomach (nauseous), headache, or feeling irritable. Bad withdrawal symptoms can include shakiness, being very nervous (anxious), and not thinking clearly.  °HOME CARE °· Join an alcohol support group. °· Stay away from people or situations that make you want to drink. °· Eat a healthy diet. Eat a lot of fresh fruits, vegetables, and lean meats. °GET HELP RIGHT AWAY IF:  °· You become confused. You start to see and hear things that are not really there. °· You feel your heart beating very fast. °· You throw up (vomit) blood or cannot stop throwing up. This may be bright red or look like black coffee grounds. °· You have blood in your poop (stool). This may be bright red, maroon colored, or black and tarry. °· You are lightheaded or pass out (faint). °· You develop a fever. °MAKE SURE YOU:  °· Understand these instructions. °· Will watch your condition. °· Will get help right away if you are not doing well or get worse. °Document Released: 11/17/2007 Document Revised: 08/23/2011 Document Reviewed: 11/17/2007 °ExitCare® Patient Information ©2015 [employer]. This information is not intended to replace advice given to you by your health care provider. Make sure you discuss any questions you have with your health care provider. ° °

## 2014-08-17 NOTE — Progress Notes (Signed)
Discharge instructions reviewed with patient. Unable to schedule PCP appointment because office was close. Patient reminded to call and schedule follow up appointment

## 2014-09-11 ENCOUNTER — Emergency Department (HOSPITAL_COMMUNITY)
Admission: EM | Admit: 2014-09-11 | Discharge: 2014-09-11 | Discharge status: Against Medical Advice | Payer: Self-pay | Attending: Emergency Medicine | Admitting: Emergency Medicine

## 2014-09-11 ENCOUNTER — Encounter (HOSPITAL_COMMUNITY): Payer: Self-pay | Admitting: *Deleted

## 2014-09-11 ENCOUNTER — Encounter (HOSPITAL_COMMUNITY): Payer: Self-pay

## 2014-09-11 ENCOUNTER — Emergency Department (HOSPITAL_COMMUNITY): Payer: Self-pay

## 2014-09-11 ENCOUNTER — Emergency Department (HOSPITAL_COMMUNITY)
Admission: EM | Admit: 2014-09-11 | Discharge: 2014-09-11 | Disposition: A | Discharge status: Home / Self Care | Payer: Self-pay | Attending: Emergency Medicine | Admitting: Emergency Medicine

## 2014-09-11 DIAGNOSIS — M545 Low back pain: Secondary | ICD-10-CM | POA: Insufficient documentation

## 2014-09-11 DIAGNOSIS — I10 Essential (primary) hypertension: Secondary | ICD-10-CM | POA: Insufficient documentation

## 2014-09-11 DIAGNOSIS — F329 Major depressive disorder, single episode, unspecified: Secondary | ICD-10-CM | POA: Insufficient documentation

## 2014-09-11 DIAGNOSIS — Z7982 Long term (current) use of aspirin: Secondary | ICD-10-CM | POA: Insufficient documentation

## 2014-09-11 DIAGNOSIS — G8929 Other chronic pain: Secondary | ICD-10-CM | POA: Insufficient documentation

## 2014-09-11 DIAGNOSIS — Z87891 Personal history of nicotine dependence: Secondary | ICD-10-CM | POA: Insufficient documentation

## 2014-09-11 DIAGNOSIS — R079 Chest pain, unspecified: Secondary | ICD-10-CM | POA: Insufficient documentation

## 2014-09-11 DIAGNOSIS — K219 Gastro-esophageal reflux disease without esophagitis: Secondary | ICD-10-CM | POA: Insufficient documentation

## 2014-09-11 DIAGNOSIS — F1010 Alcohol abuse, uncomplicated: Secondary | ICD-10-CM | POA: Insufficient documentation

## 2014-09-11 DIAGNOSIS — Z87448 Personal history of other diseases of urinary system: Secondary | ICD-10-CM | POA: Insufficient documentation

## 2014-09-11 DIAGNOSIS — Z79899 Other long term (current) drug therapy: Secondary | ICD-10-CM | POA: Insufficient documentation

## 2014-09-11 DIAGNOSIS — F10920 Alcohol use, unspecified with intoxication, uncomplicated: Secondary | ICD-10-CM

## 2014-09-11 DIAGNOSIS — F10129 Alcohol abuse with intoxication, unspecified: Secondary | ICD-10-CM | POA: Insufficient documentation

## 2014-09-11 LAB — COMPREHENSIVE METABOLIC PANEL
ALK PHOS: 78 U/L (ref 39–117)
ALT: 18 U/L (ref 0–53)
ANION GAP: 17 — AB (ref 5–15)
AST: 37 U/L (ref 0–37)
Albumin: 4 g/dL (ref 3.5–5.2)
BUN: 11 mg/dL (ref 6–23)
CO2: 21 mmol/L (ref 19–32)
CREATININE: 1.01 mg/dL (ref 0.50–1.35)
Calcium: 8.7 mg/dL (ref 8.4–10.5)
Chloride: 104 mmol/L (ref 96–112)
GFR calc Af Amer: >90 mL/min (ref >90)
GFR, EST NON AFRICAN AMERICAN: 84 mL/min — AB (ref >90)
Glucose, Bld: 102 mg/dL — ABNORMAL HIGH (ref 70–99)
POTASSIUM: 3.9 mmol/L (ref 3.5–5.1)
Sodium: 142 mmol/L (ref 135–145)
TOTAL PROTEIN: 7.3 g/dL (ref 6.0–8.3)
Total Bilirubin: 0.6 mg/dL (ref 0.3–1.2)

## 2014-09-11 LAB — ETHANOL: ALCOHOL ETHYL (B): 343 mg/dL — AB (ref 0–9)

## 2014-09-11 LAB — CBC
HCT: 43.3 % (ref 39.0–52.0)
Hemoglobin: 14.9 g/dL (ref 13.0–17.0)
MCH: 31.5 pg (ref 26.0–34.0)
MCHC: 34.4 g/dL (ref 30.0–36.0)
MCV: 91.5 fL (ref 78.0–100.0)
Platelets: 125 10*3/uL — ABNORMAL LOW (ref 150–400)
RBC: 4.73 MIL/uL (ref 4.22–5.81)
RDW: 13 % (ref 11.5–15.5)
WBC: 7.5 10*3/uL (ref 4.0–10.5)

## 2014-09-11 LAB — I-STAT TROPONIN, ED: Troponin i, poc: <0.2 ng/mL (ref 0.00–0.08)

## 2014-09-11 LAB — SALICYLATE LEVEL

## 2014-09-11 LAB — TROPONIN I: Troponin I: <0.03 ng/mL (ref <0.031)

## 2014-09-11 LAB — ACETAMINOPHEN LEVEL

## 2014-09-11 MED ORDER — THIAMINE HCL 100 MG/ML IJ SOLN: 100.0000 mg | Freq: Every day | INTRAMUSCULAR | Status: DC

## 2014-09-11 MED ORDER — LORAZEPAM 1 MG PO TABS
1.0000 mg | ORAL_TABLET | Freq: Once | ORAL | Status: AC
  Administered 2014-09-11: 1 mg via ORAL
  Filled 2014-09-11: qty 1

## 2014-09-11 MED ORDER — LORAZEPAM 2 MG/ML IJ SOLN: 0.0000 mg | Freq: Two times a day (BID) | INTRAMUSCULAR | Status: DC

## 2014-09-11 MED ORDER — LORAZEPAM 1 MG PO TABS: 0.0000 mg | ORAL_TABLET | Freq: Two times a day (BID) | ORAL | Status: DC

## 2014-09-11 MED ORDER — CHLORDIAZEPOXIDE HCL 25 MG PO CAPS: ORAL_CAPSULE | ORAL | Status: DC

## 2014-09-11 MED ORDER — IBUPROFEN 100 MG/5ML PO SUSP: 10.0000 mg/kg | Freq: Once | ORAL | Status: DC

## 2014-09-11 MED ORDER — LORAZEPAM 2 MG/ML IJ SOLN: 0.0000 mg | Freq: Four times a day (QID) | INTRAMUSCULAR | Status: DC

## 2014-09-11 MED ORDER — VITAMIN B-1 100 MG PO TABS: 100.0000 mg | ORAL_TABLET | Freq: Every day | ORAL | Status: DC

## 2014-09-11 MED ORDER — LORAZEPAM 1 MG PO TABS: 0.0000 mg | ORAL_TABLET | Freq: Four times a day (QID) | ORAL | Status: DC
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
## 2014-09-11 NOTE — Discharge Instructions (Signed)
Alcohol Use Disorder °Alcohol use disorder is a mental disorder. It is not a one-time incident of heavy drinking. Alcohol use disorder is the excessive and uncontrollable use of alcohol over time that leads to problems with functioning in one or more areas of daily living. People with this disorder risk harming themselves and others when they drink to excess. Alcohol use disorder also can cause other mental disorders, such as mood and anxiety disorders, and serious physical problems. People with alcohol use disorder often misuse other drugs.  °Alcohol use disorder is common and widespread. Some people with this disorder drink alcohol to cope with or escape from negative life events. Others drink to relieve chronic pain or symptoms of mental illness. People with a family history of alcohol use disorder are at higher risk of losing control and using alcohol to excess.  °SYMPTOMS  °Signs and symptoms of alcohol use disorder may include the following:  °· Consumption of alcohol in larger amounts or over a longer period of time than intended. °· Multiple unsuccessful attempts to cut down or control alcohol use.   °· A great deal of time spent obtaining alcohol, using alcohol, or recovering from the effects of alcohol (hangover). °· A strong desire or urge to use alcohol (cravings).   °· Continued use of alcohol despite problems at work, school, or home because of alcohol use.   °· Continued use of alcohol despite problems in relationships because of alcohol use. °· Continued use of alcohol in situations when it is physically hazardous, such as driving a car. °· Continued use of alcohol despite awareness of a physical or psychological problem that is likely related to alcohol use. Physical problems related to alcohol use can involve the brain, heart, liver, stomach, and intestines. Psychological problems related to alcohol use include intoxication, depression, anxiety, psychosis, delirium, and dementia.   °· The need for  increased amounts of alcohol to achieve the same desired effect, or a decreased effect from the consumption of the same amount of alcohol (tolerance). °· Withdrawal symptoms upon reducing or stopping alcohol use, or alcohol use to reduce or avoid withdrawal symptoms. Withdrawal symptoms include: °¨ Racing heart. °¨ Hand tremor. °¨ Difficulty sleeping. °¨ Nausea. °¨ Vomiting. °¨ Hallucinations. °¨ Restlessness. °¨ Seizures. °DIAGNOSIS °Alcohol use disorder is diagnosed through an assessment by your health care provider. Your health care provider may start by asking three or four questions to screen for excessive or problematic alcohol use. To confirm a diagnosis of alcohol use disorder, at least two symptoms must be present within a 12-month period. The severity of alcohol use disorder depends on the number of symptoms: °· Mild--two or three. °· Moderate--four or five. °· Severe--six or more. °Your health care provider may perform a physical exam or use results from lab tests to see if you have physical problems resulting from alcohol use. Your health care provider may refer you to a mental health professional for evaluation. °TREATMENT  °Some people with alcohol use disorder are able to reduce their alcohol use to low-risk levels. Some people with alcohol use disorder need to quit drinking alcohol. When necessary, mental health professionals with specialized training in substance use treatment can help. Your health care provider can help you decide how severe your alcohol use disorder is and what type of treatment you need. The following forms of treatment are available:  °· Detoxification. Detoxification involves the use of prescription medicines to prevent alcohol withdrawal symptoms in the first week after quitting. This is important for people with a history of symptoms   of withdrawal and for heavy drinkers who are likely to have withdrawal symptoms. Alcohol withdrawal can be dangerous and, in severe cases, cause  death. Detoxification is usually provided in a hospital or in-patient substance use treatment facility. °· Counseling or talk therapy. Talk therapy is provided by substance use treatment counselors. It addresses the reasons people use alcohol and ways to keep them from drinking again. The goals of talk therapy are to help people with alcohol use disorder find healthy activities and ways to cope with life stress, to identify and avoid triggers for alcohol use, and to handle cravings, which can cause relapse. °· Medicines. Different medicines can help treat alcohol use disorder through the following actions: °¨ Decrease alcohol cravings. °¨ Decrease the positive reward response felt from alcohol use. °¨ Produce an uncomfortable physical reaction when alcohol is used (aversion therapy). °· Support groups. Support groups are run by people who have quit drinking. They provide emotional support, advice, and guidance. °These forms of treatment are often combined. Some people with alcohol use disorder benefit from intensive combination treatment provided by specialized substance use treatment centers. Both inpatient and outpatient treatment programs are available. °Document Released: 07/08/2004 Document Revised: 10/15/2013 Document Reviewed: 09/07/2012 °ExitCare® Patient Information ©2015 [employer]. This information is not intended to replace advice given to you by your health care provider. Make sure you discuss any questions you have with your health care provider. ° ° ° ° °Emergency Department Resource Guide °1) Find a Doctor and Pay Out of Pocket °Although you won't have to find out who is covered by your insurance plan, it is a good idea to ask around and get recommendations. You will then need to call the office and see if the doctor you have chosen will accept you as a new patient and what types of options they offer for patients who are self-pay. Some doctors offer discounts or will set up payment plans for  their patients who do not have insurance, but you will need to ask so you aren't surprised when you get to your appointment. ° °2) Contact Your Local Health Department °Not all health departments have doctors that can see patients for sick visits, but many do, so it is worth a call to see if yours does. If you don't know where your local health department is, you can check in your phone book. The CDC also has a tool to help you locate your state's health department, and many state websites also have listings of all of their local health departments. ° °3) Find a Walk-in Clinic °If your illness is not likely to be very severe or complicated, you may want to try a walk in clinic. These are popping up all over the country in pharmacies, drugstores, and shopping centers. They're usually staffed by nurse practitioners or physician assistants that have been trained to treat common illnesses and complaints. They're usually fairly quick and inexpensive. However, if you have serious medical issues or chronic medical problems, these are probably not your best option. ° °No Primary Care Doctor: °- Call [facility] at  832-8000 - they can help you locate a primary care doctor that  accepts your insurance, provides certain services, etc. °- Physician Referral Service- 1-800-533-3463 ° °Chronic Pain Problems: °Organization         Address  Phone   Notes  °[facility]  (336) 297-2271 Patients need to be referred by their primary care doctor.  ° °Medication Assistance: °Organization           Address  Phone   Notes  °[place] [facility] [address] °[place], [place] [address] (336) 641-8030 --Must be a resident of [place] °-- Must have NO insurance coverage whatsoever (no Medicaid/ Medicare, etc.) °-- The pt. MUST have a primary care doctor that directs their care regularly and follows them in the community °  °[facility]  (866) 331-1348   °[employer]  (888)  892-1162   ° °Agencies that provide inexpensive medical care: °Organization         Address  Phone   Notes  °[facility]  (336) 832-8035   °[facility]    (336) 832-7272   °[facility] [address] °[place], [place] [address] (336) 832-4777   °[facility] [address], °[place] (336) 271-4999   °[facility]    (336) 373-0678   °[facility]    (336) 272-1050   °[facility] ° [address], [place] Phone:  (336) 832-4444, Fax:  (336) 832-4440 Hours of Operation:  9 am - 6 pm, M-F.  Also accepts Medicaid/Medicare and self-pay.  ° [facility] ° [address], [place] Phone: (336) 832-3150, Fax: (336) [phone]. Hours of Operation:  8:30 am - 5:30 pm, M-F.  Also accepts Medicaid and self-pay.  °[facility] [place] [address], [place] Phone: (336) 878-6027   °[facility] [address], [place], [place] (336)723-1848, Ext. 123 Mondays & Thursdays: 7-9 AM.  First 15 patients are seen on a first come, first serve basis. °  ° °Medicaid-accepting [place] Providers: ° °Organization         Address  Phone   Notes  °[facility] [address], [place] (336) 641-2100 Also accepts self-pay patients.  °[facility] [address], [place] ° (336) 856-9996   °[facility] [address], [place] (336) 288-8857   °[facility] [address] [place] [address], [place] (336) 299-7000   °Veita Bland [address], [place]  ° (336) 373-1557 Only accepts [place] Access Medicaid patients after they have their name applied to their card.  ° °Self-Pay (no insurance) in [place]: ° °Organization         Address  Phone   Notes  °Sickle Cell Patients, [facility] [address], [place] (336)  832-1970   °[facility] [address], [place] (336) 832-4400   °[facility] ° [address], [place] (336) 992-4800   °[facility]/Dr. Osei-Bonsu ° [address] [place] [address], [place] or [address], [place] (336) 841-8500 Phone number for both [place] and [place] locations is the same.  °[facility] [address], [place] (336) 299-0000   °[facility] [address] [place] [address], [place] or [address] (336) 852-7530 °(336) 878-2260   °[facility] [address], [place] (336) 350-1642, phone; (336) 294-5005, fax Sees patients 1st and 3rd Saturday of every month.  Must not qualify for public or private insurance (i.e. Medicaid, Medicare, [place] Health Choice, Veterans' Benefits) • Household income should be no more than 200% of the poverty level •The clinic cannot treat you if you are pregnant or think you   are pregnant • Sexually transmitted diseases are not treated at the clinic.  ° ° °Dental Care: °Organization         Address  Phone  Notes  °[facility] [address], [place] (336) 641-6152 Accepts children up to age 21 who are enrolled in Medicaid or [place] Health Choice; pregnant women with a Medicaid card; and children who have applied for Medicaid or [place] Health Choice, but were declined, whose parents can pay a reduced fee at time of service.  °[facility] [place]  [address], [place] (336) 641-7733 Accepts children up to age 21 who are enrolled in Medicaid or [place] Health Choice; pregnant women with a Medicaid card; and children who have applied for Medicaid or [place] Health Choice, but were declined, whose parents can pay a reduced fee at time of service.  °[facility] ° [address], [place] (336) 641-4533 Patients are  seen by appointment only. Walk-ins are not accepted. [facility] will see patients 18 years of age and older. °Monday - Tuesday (8am-5pm) °Most Wednesdays (8:30-5pm) °$30 per visit, cash only  °[facility] ° [address], [place] (336) 641-4533 Patients are seen by appointment only. Walk-ins are not accepted. [facility] will see patients 18 years of age and older. °One Wednesday Evening (Monthly: Volunteer Based).  $30 per visit, cash only  °[facility]  (919) 537-3737 for adults; Children under age 4, call Graduate Pediatric Dentistry at ([phone]) [phone]. Children aged 4-14, please call ([phone]) [phone] to request a pediatric application. ° Dental services are provided in all areas of dental care including fillings, crowns and bridges, complete and partial dentures, implants, gum treatment, root canals, and extractions. Preventive care is also provided. Treatment is provided to both adults and children. °Patients are selected via a lottery and there is often a waiting list. °  °[facility] [address], °[place] ° (336) 763-8833 [email or web address] °  °[facility] [address], [place], [place] (336)723-1848, Ext. 123 Second and Fourth Thursday of each month, opens at 6:30 AM; Clinic ends at 9 AM.  Patients are seen on a first-come first-served basis, and a limited number are seen during each clinic.  ° °[facility] ° [address], [place], [place] (336) 723-7904   Eligibility Requirements °You must have lived in [place], [place], or [place] counties for at least the last three months. °  You cannot be eligible for state or federal sponsored healthcare insurance, including Veterans Administration, Medicaid, or Medicare. °  You generally cannot be eligible for healthcare insurance through your employer.  °  How to apply: °Eligibility screenings are held every Tuesday and Wednesday afternoon from 1:00 pm until 4:00  pm. You do not need an appointment for the interview!  °[facility] [address], [place], [place] 336-631-2330   °[facility]  336-342-8273   °[facility]  336-703-3100   °[facility]  336-570-6415   ° °Behavioral Health Resources in the Community: °Intensive Outpatient Programs °Organization         Address  Phone  Notes  °[facility] [address], [place], [place] 336-878-6098   °[facility] [address], [place],  336-832-9800   °[facility] [address], [place], [place] ° 336-882-2125   °[facility] [address],  °[place], [place] 1-800-853-5163 or 336-641-4981   °Substance Abuse Resources °Organization         Address  Phone  Notes  °[facility]  336-882-2125   °[facility]  336-784-9470   °[facility]  336-285-9073   °[facility]  336-845-3988   °Residential & Outpatient Substance Abuse Program  1-800-659-3381   °Psychological Services °Organization         Address  Phone  Notes  °[facility]  336- 832-9600   °[facility]  336- 378-7881   °[facility] [address], [place] 1-800-853-5163 or 336-641-4981   ° °Mobile Crisis Teams °Organization         Address  Phone  Notes  °[facility], Mobile Crisis Care Unit  1-877-626-1772   °Assertive °[facility] ° [address] [place], [place] 336-834-9664   °Sharon DeEsch [address] °[place] 336-554-5454   ° °Self-Help/Support Groups °Organization         Address  Phone             Notes  °[facility] - variety of support groups  336- 373-1402 Call for more information  °Narcotics Anonymous (NA), [facility] [address], °[place]  2 meetings at this location  ° °Residential Treatment Programs °Organization          Address  Phone  Notes  °[facility] [address],    °[place]  1-866-801-8205   °[facility] ° [address], [place], [place] 704-293-8524   °[facility] [address], [place] 336-845-3988 Admissions: 8am-3pm M-F  °[facility] [address]    °[place], [place] 336-841-1104   °[facility] [address], [place], [place] 336-379-7146   °[facility] [address]  °[place], [place] 336-285-9073   °[facility] Intensive Outpatient [address], [place], [place] 336-852-3033   °[facility] [address]  °[place], [place] 1-877-615-2722 or 336-784-9470   °[facility] [address] [place], [place] 336-227-7417 Accepts Medicaid  °[facility] [address]  °[place] 1-800-659-3381 Substance Abuse/Addiction Treatment  ° °[place] Behavioral Health Resources °Organization         Address  Phone  Notes  °[facility]  (888) 581-9988   °Julie Brannon, PhD [address] [place], [place]   (336) 349-5553 or (336) 951-0000   °[facility]   [address] °[place], [place] (336) 349-4454   °[facility] [address], [place], [place] (336) 342-8316 Insurance/Medicaid/sponsorship through [facility]  °[facility] [address]                                    [place], [place] (336) 342-8316 Therapy/tele-psych/case  °[facility] [address]  ° [place], [place] (336) 349-2233    °Dr. Arfeen  (336) 349-4544   °[facility] [place]  [employer] [place] [facility] 1) [address], [place] °2) [address], [place] °3)  [address], [place] (336) 349-3220 °(336) 342-7768 ° °(336) 342-8140   °[place] [facility] (336)   342-1394 or (336) 342-3537 (After Hours)    ° ° ° °
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
## 2014-09-11 NOTE — ED Notes (Signed)
Pt was here earlier and left to make a phone call, he states that he wants detox, last drink was 10pm last night, he also complains of left flank pain

## 2014-09-11 NOTE — ED Notes (Signed)
Pt reports L cp that radiates through his back.  Pt reports pain started last night  Around 2200. Pt also requesting detox from etoh.  Last drink was yesterday. Pt reports normally drinking 1/2 gallon of vodka a day.

## 2014-09-11 NOTE — ED Notes (Signed)
Called pt from the lobby to move to Claramae Rigdon bed. Pt no where to be found in lobby, in bathroom or outside.

## 2014-09-12 ENCOUNTER — Emergency Department (HOSPITAL_COMMUNITY)
Admission: EM | Admit: 2014-09-12 | Discharge: 2014-09-12 | Disposition: A | Discharge status: Home / Self Care | Payer: No Typology Code available for payment source | Attending: Emergency Medicine | Admitting: Emergency Medicine

## 2014-09-12 ENCOUNTER — Encounter (HOSPITAL_COMMUNITY): Payer: Self-pay | Admitting: Behavioral Health

## 2014-09-12 ENCOUNTER — Ambulatory Visit (HOSPITAL_COMMUNITY)
Admission: EM | Admit: 2014-09-12 | Discharge: 2014-09-12 | Disposition: A | Discharge status: Home / Self Care | Payer: Federal, State, Local not specified - Other | Source: Intra-hospital | Attending: Psychiatry | Admitting: Psychiatry

## 2014-09-12 ENCOUNTER — Encounter (HOSPITAL_COMMUNITY): Payer: Self-pay | Admitting: Emergency Medicine

## 2014-09-12 ENCOUNTER — Inpatient Hospital Stay (HOSPITAL_COMMUNITY)
Admission: AD | Admit: 2014-09-12 | Discharge: 2014-09-19 | DRG: 897 | Disposition: A | Discharge status: Home / Self Care | Payer: Federal, State, Local not specified - Other | Source: Intra-hospital | Attending: Psychiatry | Admitting: Psychiatry

## 2014-09-12 DIAGNOSIS — Z811 Family history of alcohol abuse and dependence: Secondary | ICD-10-CM

## 2014-09-12 DIAGNOSIS — F1020 Alcohol dependence, uncomplicated: Secondary | ICD-10-CM | POA: Diagnosis not present

## 2014-09-12 DIAGNOSIS — Z87891 Personal history of nicotine dependence: Secondary | ICD-10-CM | POA: Insufficient documentation

## 2014-09-12 DIAGNOSIS — I10 Essential (primary) hypertension: Secondary | ICD-10-CM | POA: Diagnosis present

## 2014-09-12 DIAGNOSIS — F39 Unspecified mood [affective] disorder: Secondary | ICD-10-CM | POA: Diagnosis not present

## 2014-09-12 DIAGNOSIS — F1010 Alcohol abuse, uncomplicated: Secondary | ICD-10-CM | POA: Insufficient documentation

## 2014-09-12 DIAGNOSIS — R45851 Suicidal ideations: Secondary | ICD-10-CM | POA: Diagnosis present

## 2014-09-12 DIAGNOSIS — Z87448 Personal history of other diseases of urinary system: Secondary | ICD-10-CM | POA: Insufficient documentation

## 2014-09-12 DIAGNOSIS — F411 Generalized anxiety disorder: Secondary | ICD-10-CM | POA: Diagnosis present

## 2014-09-12 DIAGNOSIS — K219 Gastro-esophageal reflux disease without esophagitis: Secondary | ICD-10-CM | POA: Insufficient documentation

## 2014-09-12 DIAGNOSIS — G4700 Insomnia, unspecified: Secondary | ICD-10-CM | POA: Diagnosis present

## 2014-09-12 DIAGNOSIS — K709 Alcoholic liver disease, unspecified: Secondary | ICD-10-CM | POA: Diagnosis present

## 2014-09-12 DIAGNOSIS — G8929 Other chronic pain: Secondary | ICD-10-CM | POA: Insufficient documentation

## 2014-09-12 DIAGNOSIS — F10232 Alcohol dependence with withdrawal with perceptual disturbance: Secondary | ICD-10-CM | POA: Diagnosis not present

## 2014-09-12 DIAGNOSIS — Z79899 Other long term (current) drug therapy: Secondary | ICD-10-CM | POA: Insufficient documentation

## 2014-09-12 DIAGNOSIS — G894 Chronic pain syndrome: Secondary | ICD-10-CM | POA: Diagnosis present

## 2014-09-12 DIAGNOSIS — F329 Major depressive disorder, single episode, unspecified: Secondary | ICD-10-CM | POA: Diagnosis present

## 2014-09-12 DIAGNOSIS — Z7982 Long term (current) use of aspirin: Secondary | ICD-10-CM | POA: Insufficient documentation

## 2014-09-12 DIAGNOSIS — F322 Major depressive disorder, single episode, severe without psychotic features: Secondary | ICD-10-CM | POA: Diagnosis present

## 2014-09-12 DIAGNOSIS — F10230 Alcohol dependence with withdrawal, uncomplicated: Secondary | ICD-10-CM | POA: Diagnosis not present

## 2014-09-12 DIAGNOSIS — F10239 Alcohol dependence with withdrawal, unspecified: Secondary | ICD-10-CM | POA: Diagnosis present

## 2014-09-12 DIAGNOSIS — F1310 Sedative, hypnotic or anxiolytic abuse, uncomplicated: Secondary | ICD-10-CM | POA: Insufficient documentation

## 2014-09-12 DIAGNOSIS — Z833 Family history of diabetes mellitus: Secondary | ICD-10-CM

## 2014-09-12 DIAGNOSIS — F419 Anxiety disorder, unspecified: Secondary | ICD-10-CM | POA: Diagnosis not present

## 2014-09-12 DIAGNOSIS — F10988 Alcohol use, unspecified with other alcohol-induced disorder: Secondary | ICD-10-CM | POA: Diagnosis present

## 2014-09-12 LAB — RAPID URINE DRUG SCREEN, HOSP PERFORMED
Amphetamines: NOT DETECTED
BENZODIAZEPINES: POSITIVE — AB
Barbiturates: NOT DETECTED
Cocaine: NOT DETECTED
Opiates: NOT DETECTED
Tetrahydrocannabinol: NOT DETECTED

## 2014-09-12 LAB — CBG MONITORING, ED: Glucose-Capillary: 91 mg/dL (ref 70–99)

## 2014-09-12 MED ORDER — LORAZEPAM 2 MG/ML IJ SOLN: 0.0000 mg | Freq: Four times a day (QID) | INTRAMUSCULAR | Status: DC

## 2014-09-12 MED ORDER — PANTOPRAZOLE SODIUM 40 MG PO TBEC
40.0000 mg | DELAYED_RELEASE_TABLET | Freq: Every day | ORAL | Status: DC
  Administered 2014-09-13 – 2014-09-18 (×6): 40 mg via ORAL
  Filled 2014-09-12 (×3): qty 1
  Filled 2014-09-12: qty 14
  Filled 2014-09-12 (×4): qty 1

## 2014-09-12 MED ORDER — LORAZEPAM 1 MG PO TABS: 0.0000 mg | ORAL_TABLET | Freq: Two times a day (BID) | ORAL | Status: DC

## 2014-09-12 MED ORDER — VITAMIN B-1 100 MG PO TABS
100.0000 mg | ORAL_TABLET | Freq: Every day | ORAL | Status: DC
  Administered 2014-09-12: 100 mg via ORAL
  Filled 2014-09-12: qty 1

## 2014-09-12 MED ORDER — LORAZEPAM 2 MG/ML IJ SOLN: 0.0000 mg | Freq: Two times a day (BID) | INTRAMUSCULAR | Status: DC

## 2014-09-12 MED ORDER — LORAZEPAM 1 MG PO TABS
0.0000 mg | ORAL_TABLET | Freq: Four times a day (QID) | ORAL | Status: DC
  Administered 2014-09-12 (×3): 1 mg via ORAL
  Filled 2014-09-12: qty 1
  Filled 2014-09-12: qty 2
  Filled 2014-09-12: qty 1

## 2014-09-12 MED ORDER — LORAZEPAM 1 MG PO TABS
0.0000 mg | ORAL_TABLET | Freq: Four times a day (QID) | ORAL | Status: DC
  Administered 2014-09-12: 1 mg via ORAL
  Filled 2014-09-12: qty 1

## 2014-09-12 MED ORDER — ADULT MULTIVITAMIN W/MINERALS CH
1.0000 | ORAL_TABLET | Freq: Every day | ORAL | Status: DC
  Filled 2014-09-12 (×2): qty 1

## 2014-09-12 MED ORDER — THIAMINE HCL 100 MG/ML IJ SOLN: 100.0000 mg | Freq: Every day | INTRAMUSCULAR | Status: DC

## 2014-09-12 MED ORDER — HYDROXYZINE HCL 50 MG PO TABS
50.0000 mg | ORAL_TABLET | Freq: Four times a day (QID) | ORAL | Status: DC | PRN
  Administered 2014-09-12: 50 mg via ORAL
  Filled 2014-09-12: qty 1

## 2014-09-12 MED ORDER — HYDROXYZINE HCL 25 MG PO TABS: 50.0000 mg | ORAL_TABLET | Freq: Four times a day (QID) | ORAL | Status: DC | PRN

## 2014-09-12 MED ORDER — VITAMIN B-1 100 MG PO TABS
100.0000 mg | ORAL_TABLET | Freq: Every day | ORAL | Status: DC
  Filled 2014-09-12 (×2): qty 1

## 2014-09-12 MED ORDER — TRAZODONE HCL 100 MG PO TABS
100.0000 mg | ORAL_TABLET | Freq: Every evening | ORAL | Status: DC | PRN
  Administered 2014-09-12 – 2014-09-17 (×6): 100 mg via ORAL
  Filled 2014-09-12 (×4): qty 1
  Filled 2014-09-12: qty 14
  Filled 2014-09-12 (×2): qty 1

## 2014-09-12 MED ORDER — TRAMADOL HCL 50 MG PO TABS: 50.0000 mg | ORAL_TABLET | Freq: Two times a day (BID) | ORAL | Status: DC | PRN

## 2014-09-12 MED ORDER — PANTOPRAZOLE SODIUM 40 MG PO TBEC
40.0000 mg | DELAYED_RELEASE_TABLET | Freq: Every day | ORAL | Status: DC
Start: 2014-09-12 — End: 2014-09-12
  Administered 2014-09-12: 40 mg via ORAL
  Filled 2014-09-12: qty 1

## 2014-09-12 MED ORDER — LORAZEPAM 1 MG PO TABS
0.0000 mg | ORAL_TABLET | Freq: Two times a day (BID) | ORAL | Status: DC
  Filled 2014-09-12: qty 1

## 2014-09-12 MED ORDER — ADULT MULTIVITAMIN W/MINERALS CH
1.0000 | ORAL_TABLET | Freq: Every day | ORAL | Status: DC
  Administered 2014-09-12: 1 via ORAL
  Filled 2014-09-12: qty 1

## 2014-09-12 MED ORDER — TRAZODONE HCL 100 MG PO TABS: 100.0000 mg | ORAL_TABLET | Freq: Every evening | ORAL | Status: DC | PRN

## 2014-09-12 MED ORDER — TRAMADOL HCL 50 MG PO TABS
50.0000 mg | ORAL_TABLET | Freq: Two times a day (BID) | ORAL | Status: DC | PRN
  Administered 2014-09-12: 50 mg via ORAL
  Filled 2014-09-12: qty 1

## 2014-09-12 NOTE — Progress Notes (Signed)
Admission note: [facility] from alcohol abuse. Pt reports that he drinks a 1/2 of gallon daily and have been drinking since the his mid 4920s. Pt reports that the longest he's been sober is for 8 months. 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 Pt reports that he is homeless and has no place to go. Pt seeking long-term treatment for substance abuse. Pt reports having withdrawal symptoms of tactile hallucinations, sweats, tremors, pin and needles sensation and anxiety. Pt denies using any illegal substances. Pt reports passive suicidal thoughts and verbally contracts not to harm self. Pt has a upcoming courtdate scheduled for April 28 th, due to 2nd degree trespassing charge. Pt v/s taken, skin assessed, belongings searched and required documents signed. Pt safely brought onto the unit.

## 2014-09-12 NOTE — BH Assessment (Signed)
[facility] Assessment Progress Note     Per Carolanne GrumblingGerald Taylor, MD and Julieanne CottonJosephine, NP, this pt requires psychiatric hospitalization at this time. Rosey BathKelly Southard, RN, [facility] to Rm [id number]. Pt has signed Voluntary Admission and Consent for Treatment, as well as Consent to Release Information, and signed [facility]. Pt's nurse, Carlisle BeersLuann, has been notified, and she agrees to send original paperwork along with pt via Juel [place], and to call report to [phone].  Doylene Canninghomas Domonik Levario, MA Triage Specialist 09/12/2014 @1543 

## 2014-09-12 NOTE — ED Notes (Signed)
Pt resting quietly with eyes closed. Snoring at times. Appears in no distress. Delayed vital signs due to patient resting, will recheck once awake.

## 2014-09-12 NOTE — ED Notes (Signed)
Pt resting quietly with eyes closed before obtaining vital signs. Pt appears in no distress.

## 2014-09-12 NOTE — ED Notes (Signed)
Acuity low. 

## 2014-09-12 NOTE — BH Assessment (Signed)
Per ED notes [facility] requesting [facility] detox twice, and left twice. [facility] and was [facility] for [facility] and assessment.   Pt has a BAL of 343 at 1715, no UDS labs were found.   Pt has has hx of etoh abuse and and substance induced mood disorder.   Assessment to commence shortly.    Clista BernhardtNancy Cristella Stiver, [facility] Triage Specialist 09/12/2014 1:41 AM

## 2014-09-12 NOTE — ED Notes (Signed)
TTS is at bedside 

## 2014-09-12 NOTE — ED Notes (Signed)
Pt ambulated to bathroom without difficulty and back to bed where he is talking with visitor. Pt also asked for pen to fill out paper work and ask to use the phone.

## 2014-09-12 NOTE — ED Notes (Signed)
Pt was discharged from here and walked across the road to behavioral health  Pt spoke with Corrie DandyMary and was sent here to be medically cleared and to have an assessment done  Pt has paperwork signed by Corrie DandyMary  This recorder spoke with [facility] and she states he needs medical clearance and a TTS consult prior to finding him placement  [facility]

## 2014-09-12 NOTE — ED Notes (Signed)
Bed: [id number] Expected date:  Expected time:  Means of arrival:  Comments: Hall C 
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
## 2014-09-12 NOTE — Tx Team (Signed)
Initial Interdisciplinary Treatment Plan   PATIENT STRESSORS: Legal issue Substance abuse   PATIENT STRENGTHS: Ability for insight Capable of independent living Motivation for treatment/growth   PROBLEM LIST: Problem List/Patient Goals Date to be addressed Date deferred Reason deferred Estimated date of resolution  Relapse prevention 09/12/14     Financial education  09/12/14     Substance abuse 09/12/14                                          DISCHARGE CRITERIA:  Ability to meet basic life and health needs Adequate post-discharge living arrangements Improved stabilization in mood, thinking, and/or behavior Verbal commitment to aftercare and medication compliance Withdrawal symptoms are absent or subacute and managed without 24-hour nursing intervention  PRELIMINARY DISCHARGE PLAN: Attend aftercare/continuing care group Attend PHP/IOP  PATIENT/FAMIILY INVOLVEMENT: This treatment plan has been presented to and reviewed with the patient, Brent Gay, and/or family member.  The patient and family have been given the opportunity to ask questions and make suggestions.  [name] L 09/12/2014, 5:30 PM

## 2014-09-12 NOTE — ED Notes (Signed)
Security has pocket knife locked up

## 2014-09-12 NOTE — ED Notes (Signed)
Brent Gay - Pt sister/person to contact  [phone]

## 2014-09-12 NOTE — BH Assessment (Signed)
Per Donell SievertSpencer Simon, PA pt meets inpt criteria and [facility] and pt understanding that he will NOT be given narcotic pain medication while going through detox.  Per Bunnie Pionori AC, there are no [facility] hall [facility], but could possibly have some discharges in the AM. TTS to seeking placement. [facility]  [facility]   Eevee Borbon, [place] Triage Specialist 09/12/2014 2:53 AM

## 2014-09-12 NOTE — Progress Notes (Signed)
ED CM approached by ED CNA with pt's [facility] completed application. CM redirected CNA to inform pt that he needs to do exactly what the [facility] staff member requested of him and take the application to his appointment to be reviewed by [facility] staff vs ED CM.  CNA returned application to pt

## 2014-09-12 NOTE — ED Notes (Signed)
Pt filling out paper work and eating lunch on the side of bed.

## 2014-09-12 NOTE — BHH Counselor (Signed)
[facility] after leaving [facility] ED earlier this evening (around 8 pm).  Pt smelled of alcohol, was red in the face, eyes were blood shot and words were somewhat slurred. He was able to articulate with coherent thoughts and walk unassisted with reasonable gait and pace.  After reviewing his hx in EPIC, this assessor conferred with another Triage Specialist, [facility] and Extender for appropriate course of action.  All three indicated that this assessor should tell the pt to [facility] if he wanted to pursue Alcohol Detox as he needed medical [facility]. With Security aware and present in the background, this assessor advised the pt [facility] before TTS could assess him for MH/SA services.  He was irritated and voiced protest at having to [facility]. He retuctantly agreed and [facility].  Beryle FlockMary Emmanuell Kantz, MS, CRC, [facility] Triage Specialist [facility]

## 2014-09-12 NOTE — ED Notes (Signed)
Provided patient a sandwich and ginger ale.
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
## 2014-09-12 NOTE — ED Notes (Signed)
Pt resting with eyes closed. Snoring. Appears in acute distress.
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
## 2014-09-13 DIAGNOSIS — F10232 Alcohol dependence with withdrawal with perceptual disturbance: Secondary | ICD-10-CM

## 2014-09-13 DIAGNOSIS — R45851 Suicidal ideations: Secondary | ICD-10-CM

## 2014-09-13 MED ORDER — NAPROXEN 375 MG PO TABS: 375.0000 mg | ORAL_TABLET | Freq: Two times a day (BID) | ORAL | Status: DC

## 2014-09-13 MED ORDER — GABAPENTIN 100 MG PO CAPS: 100.0000 mg | ORAL_CAPSULE | Freq: Two times a day (BID) | ORAL | Status: DC

## 2014-09-13 MED ORDER — LORAZEPAM 1 MG PO TABS
1.0000 mg | ORAL_TABLET | Freq: Two times a day (BID) | ORAL | Status: AC
  Administered 2014-09-15 (×2): 1 mg via ORAL
  Filled 2014-09-13 (×2): qty 1

## 2014-09-13 MED ORDER — ONDANSETRON 4 MG PO TBDP: 4.0000 mg | ORAL_TABLET | Freq: Four times a day (QID) | ORAL | Status: AC | PRN

## 2014-09-13 MED ORDER — LORAZEPAM 1 MG PO TABS
1.0000 mg | ORAL_TABLET | Freq: Three times a day (TID) | ORAL | Status: AC
  Administered 2014-09-14 (×3): 1 mg via ORAL
  Filled 2014-09-13 (×3): qty 1

## 2014-09-13 MED ORDER — LOPERAMIDE HCL 2 MG PO CAPS: 2.0000 mg | ORAL_CAPSULE | ORAL | Status: AC | PRN

## 2014-09-13 MED ORDER — LORAZEPAM 1 MG PO TABS
1.0000 mg | ORAL_TABLET | Freq: Every day | ORAL | Status: AC
  Administered 2014-09-16: 1 mg via ORAL
  Filled 2014-09-13 (×2): qty 1

## 2014-09-13 MED ORDER — LORAZEPAM 1 MG PO TABS
1.0000 mg | ORAL_TABLET | Freq: Four times a day (QID) | ORAL | Status: AC | PRN
  Administered 2014-09-14 – 2014-09-15 (×3): 1 mg via ORAL
  Filled 2014-09-13 (×2): qty 1

## 2014-09-13 MED ORDER — NAPROXEN 500 MG PO TABS
500.0000 mg | ORAL_TABLET | Freq: Three times a day (TID) | ORAL | Status: DC | PRN
  Administered 2014-09-13 – 2014-09-18 (×12): 500 mg via ORAL
  Filled 2014-09-13 (×7): qty 1
  Filled 2014-09-13: qty 30
  Filled 2014-09-13 (×5): qty 1

## 2014-09-13 MED ORDER — VITAMIN B-1 100 MG PO TABS
100.0000 mg | ORAL_TABLET | Freq: Every day | ORAL | Status: DC
  Administered 2014-09-13 – 2014-09-18 (×6): 100 mg via ORAL
  Filled 2014-09-13 (×8): qty 1

## 2014-09-13 MED ORDER — GABAPENTIN 100 MG PO CAPS
200.0000 mg | ORAL_CAPSULE | Freq: Two times a day (BID) | ORAL | Status: DC
  Administered 2014-09-13: 200 mg via ORAL
  Filled 2014-09-13 (×5): qty 2

## 2014-09-13 MED ORDER — HYDROXYZINE HCL 25 MG PO TABS
25.0000 mg | ORAL_TABLET | Freq: Four times a day (QID) | ORAL | Status: DC | PRN
  Administered 2014-09-13 – 2014-09-15 (×6): 25 mg via ORAL
  Filled 2014-09-13 (×6): qty 1

## 2014-09-13 MED ORDER — ADULT MULTIVITAMIN W/MINERALS CH
1.0000 | ORAL_TABLET | Freq: Every day | ORAL | Status: DC
  Administered 2014-09-13 – 2014-09-18 (×6): 1 via ORAL
  Filled 2014-09-13 (×3): qty 1
  Filled 2014-09-13: qty 14
  Filled 2014-09-13 (×4): qty 1

## 2014-09-13 MED ORDER — METAXALONE 400 MG HALF TABLET
400.0000 mg | ORAL_TABLET | Freq: Three times a day (TID) | ORAL | Status: DC
  Administered 2014-09-13 – 2014-09-14 (×4): 400 mg via ORAL
  Administered 2014-09-15: 800 mg via ORAL
  Administered 2014-09-15 (×2): 400 mg via ORAL
  Filled 2014-09-13 (×11): qty 1

## 2014-09-13 MED ORDER — LORAZEPAM 1 MG PO TABS
1.0000 mg | ORAL_TABLET | Freq: Four times a day (QID) | ORAL | Status: AC
  Administered 2014-09-13 (×4): 1 mg via ORAL
  Filled 2014-09-13 (×4): qty 1

## 2014-09-13 MED ORDER — GABAPENTIN 300 MG PO CAPS
300.0000 mg | ORAL_CAPSULE | Freq: Two times a day (BID) | ORAL | Status: DC
  Administered 2014-09-13 – 2014-09-15 (×5): 300 mg via ORAL
  Filled 2014-09-13 (×8): qty 1
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
## 2014-09-13 NOTE — Progress Notes (Signed)
D: Patient seen on day room playing card with peers. Cheerful and appropriate. Patient complained of anxiety and generalized body pain. Vistaril 25 mg and Naprosyn 500 mg given respectively as ordered. Patient denies SI, AH/VH. Patient attended and participated in group.  A: support and encouragement offered to patient. Encouraged to continue with the treatment plan and verbalize needs to staff. Due medications given as ordered. Safety maintained at all times.  R: Patient receptive to nursing interventions.  Will continue to monitor patient for safety and stability.
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
## 2014-09-13 NOTE — [facility] Group Notes (Signed)
[facility] LCSW Group Therapy  09/13/2014 12:37 PM  Type of Therapy:  Group Therapy  Participation Level:  Active  Participation Quality:  Attentive  Affect:  Depressed and Flat  Cognitive:  Oriented  Insight:  Improving  Engagement in Therapy:  Improving  Modes of Intervention:  Confrontation, Discussion, Education, Exploration, Problem-solving, Rapport Building, Socialization and Support  Summary of Progress/Problems: Feelings around Relapse. Group members discussed the meaning of relapse and shared personal stories of relapse, how it affected them and others, and how they perceived themselves during this time. Group members were encouraged to identify triggers, warning signs and coping skills used when facing the possibility of relapse. Social supports were discussed and explored in detail. Post Acute Withdrawal Syndrome (handout provided) was introduced and examined. Pt's were encouraged to ask questions, talk about key points associated with PAWS, and process this information in terms of relapse prevention. Brent Gay was attentive and engaged during today's processing group. He shared his latest relapse experience with the group and talked about his past experiences with PAWS. Brent Gay stated that "it's hard when my family doesn't understand addiction." Brent Gay continues to show progress in the group setting and improving insight AEB his ability to identify ways to strengthen his social supports and talked about the importance of having a safety plan in place to deal with PAWS during recovery.   Smart, Brent Gay LCSWA 09/13/2014, 12:37 PM

## 2014-09-13 NOTE — Clinical Social Work Note (Signed)
[facility] referral [facility]: [facility]13/[facility] [phone] at 10:30AM  [facility] Smart, LCSWA 09/13/2014 10:36 AM

## 2014-09-13 NOTE — [facility] Suicide Risk Assessment (Signed)
[facility] Admission Suicide Risk Assessment   Nursing information obtained from:  Patient Demographic factors:  Male, Caucasian, Low socioeconomic status, Living alone, Unemployed Current Mental Status:  Suicidal ideation indicated by patient, Self-harm thoughts Loss Factors:  Loss of significant relationship, Legal issues, Financial problems / change in socioeconomic status Historical Factors:  Prior suicide attempts Risk Reduction Factors:  Sense of responsibility to family, Positive social support Total Time spent with patient: 30 minutes Principal Problem: Alcohol dependence with withdrawal Diagnosis:   Patient Active Problem List   Diagnosis Date Noted  . 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 Major depressive disorder, severe [F32.2]     Priority: High  . Alcohol dependence with withdrawal [F10.239] 06/05/2012    Priority: High  . Epididymal cyst [N50.3] 08/13/2014  . Essential hypertension [I10]   . Sinus tachycardia [I47.1] 08/12/2014  . Hypokalemia [E87.6] 08/12/2014  . Alcohol dependence with uncomplicated withdrawal [F10.230] 08/05/2014  . Alcohol withdrawal syndrome with complication [F10.239] 06/15/2014  . Alcohol use disorder, severe, dependence [F10.20]   . Substance induced mood disorder [F19.94]   . 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 Suicidal ideation [R45.851]   . Chronic pain syndrome [G89.4] 01/23/2014  . GAD (generalized anxiety disorder) [F41.1] 12/25/2013  . Change in blood platelet count [D69.6] 08/02/2013  . Anxiety disorder [F41.9] 06/06/2012  . Alcohol withdrawal [F10.239] 06/05/2012  . Cocaine dependence [F14.20] 06/05/2012  . Polysubstance abuse [F19.10] 06/04/2012    Class: Acute  . Polysubstance dependence including opioid type drug, continuous use [F11.229] 06/04/2012    Class: Acute  . Cocaine abuse [F14.10] 06/03/2012    Class: Chronic  . Chest pain [R07.9] 04/25/2012  . Acute inflammation of the  pancreas [K85.9] 08/24/2011  . Alcohol abuse [F10.10] 10/15/2009  . CHEST PAIN, ATYPICAL [R07.89] 10/15/2009     Continued Clinical Symptoms:  Alcohol Use Disorder Identification Test Final Score (AUDIT): 27 The "Alcohol Use Disorders Identification Test", Guidelines for Use in Primary Care, Second Edition.  World Science writer [facility]). Score between 0-7:  no or low risk or alcohol related problems. Score between 8-15:  moderate risk of alcohol related problems. Score between 16-19:  high risk of alcohol related problems. Score 20 or above:  warrants further diagnostic evaluation for alcohol dependence and treatment.   CLINICAL FACTORS:   Severe Anxiety and/or Agitation Depression:   Aggression Anhedonia Comorbid alcohol abuse/dependence Hopelessness Impulsivity Insomnia Severe Alcohol/Substance Abuse/Dependencies Chronic Pain Previous Psychiatric Diagnoses and Treatments   Musculoskeletal: Strength & Muscle Tone: within normal limits Gait & Station: normal Patient leans: N/A  Psychiatric Specialty Exam: Physical Exam  Psychiatric: His speech is normal. His mood appears anxious. He is agitated. Cognition and memory are normal. He expresses impulsivity. He exhibits a depressed mood. He expresses suicidal ideation.    Review of Systems  Constitutional: Positive for chills, malaise/fatigue and diaphoresis.  Eyes: Positive for blurred vision.  Respiratory: Negative.   Cardiovascular: Negative.   Gastrointestinal: Positive for nausea.  Genitourinary: Negative.   Musculoskeletal: Positive for myalgias and joint pain.  Skin: Negative.   Neurological: Positive for tremors, weakness and headaches.  Endo/Heme/Allergies: Negative.   Psychiatric/Behavioral: Positive for depression, suicidal ideas and substance abuse. The patient is nervous/anxious and has insomnia.     Blood pressure 137/92, pulse 98, temperature 98.4 F (36.9 C), temperature source Oral, resp. rate 18,  height 5' 10.08" (1.78 m), weight 92.987 kg (205 lb), SpO2 99 %.Body mass index is 29.35 kg/(m^2).  General Appearance: Casual  Eye Contact::  Good  Speech:  Clear and Coherent  Volume:  Decreased  Mood:  Anxious and Depressed  Affect:  Constricted  Thought Process:  Linear and Logical  Orientation:  Full (Time, Place, and Person)  Thought Content:  Negative  Suicidal Thoughts:  Yes.  without intent/plan  Homicidal Thoughts:  No  Memory:  Immediate;   Fair Recent;   Fair Remote;   Fair  Judgement:  Impaired  Insight:  Shallow  Psychomotor Activity:  Decreased  Concentration:  Fair  Recall:  FiservFair  Fund of Knowledge:Fair  Language: Good  Akathisia:  No  Handed:  Right  AIMS (if indicated):     Assets:  Communication Skills Desire for Improvement Physical Health  Sleep:  Number of Hours: 6.5  Cognition: WNL  ADL's:  Intact     COGNITIVE FEATURES THAT CONTRIBUTE TO RISK:  Closed-mindedness    SUICIDE RISK:   Mild:  Suicidal ideation of limited frequency, intensity, duration, and specificity.  There are no identifiable plans, no associated intent, mild dysphoria and related symptoms, good self-control (both objective and subjective assessment), few other risk factors, and identifiable protective factors, including available and accessible social support.  PLAN OF CARE: 1. Admit for crisis management and stabilization. 2. Medication management to reduce current symptoms to base line and improve the patient's overall level of functioning 3. Treat health problems as indicated. 4. Develop treatment plan to decrease risk of relapse upon discharge and the need for readmission. 5. Psycho-social education regarding relapse prevention and self care. 6. Health care follow up as needed for medical problems. 7. Restart home medications where appropriate.   Medical Decision Making:  Review or order clinical lab tests (1), Established Problem, Worsening (2), Review of Medication Regimen  & Side Effects (2) and Review of New Medication or Change in Dosage (2)  I certify that inpatient services furnished can reasonably be expected to improve the patient's condition.   Thedore MinsAkintayo, Antavia Tandy, MD 09/13/2014, 9:55 AM

## 2014-09-13 NOTE — Progress Notes (Signed)
Recreation Therapy Notes  Date: 04.01.2016 Time: 9:30am Location: [address] Group Room   Group Topic: Stress Management  Goal Area(s) Addresses:  Patient will actively participate in stress management techniques presented during session.   Behavioral Response: Did not attend.   Marykay Lexenise L Curby Carswell, LRT/CTRS  Jearl KlinefelterBlanchfield, Solene Hereford L 09/13/2014 12:58 PM

## 2014-09-13 NOTE — [facility] Group Notes (Signed)
[facility] Note   09/13/2014 10:06 AM  Participation Quality:  Appropriate   Mood/Affect:  Anxious and Depressed  Depression Rating:  10  Anxiety Rating:  10  Thoughts of Suicide:  No Will you contract for safety?   NA  Current AVH:  No  Plan for Discharge/Comments:  Pt reports that withdrawals are severe this morning. [facility] and for help with depression and anxiety." Pt reports that he "may be interested" in inpatient treatment "but I'm not sure." Pt agreeable to allowing CSW to [facility]. Pt also given ADS information if he chooses [facility]. Pt reports that he is homeless and has been living in his truck since he lost his job 2 months ago.   Transportation Means: girlfriend?  Supports: girlfriend/limited family supports/AA   Smart, OncologistHeather LCSWA

## 2014-09-13 NOTE — Progress Notes (Signed)
Patient ID: Brent Gay, male   DOB: 01/02/1966, 49 y.o.   MRN: [id number]  D: Patient with c/o anxiety just after laughing with peers in hallway. Pt states that he is upset because his family is doing the "Tough love thing" and refuses to support him financially anymore which is forcing him to be homeless. Pt blaming others for his own actions and with no insight. Pt asked this RN to talk to social work and "Get me an apartment to live in and fix my truck". Pt with passive SI but verbally contracts for safety. A: Q 15 minute safety checks, encourage staff/peer interaction, group participation and administer medications as ordered. R: No s/s of distress noted.

## 2014-09-13 NOTE — Tx Team (Signed)
Interdisciplinary Treatment Plan Update (Adult)   Date: 09/13/2014   Time Reviewed: 8:22 AM  Progress in Treatment:  Attending groups: yes  Participating in groups: yes    Taking medication as prescribed: Yes  Tolerating medication: Yes  Family/Significant othe contact made: Not yet. SPE required for this pt.   Patient understands diagnosis: Yes, AEB seeking treatment for ETOH detox, passive SI, depression/mood instability, and for medication management.  Discussing patient identified problems/goals with staff: Yes  Medical problems stabilized or resolved: Yes  Denies suicidal/homicidal ideation: Passive SI/Able to contract for safety on the unit.  Patient has not harmed self or Others: Yes  New problem(s) identified:  Discharge Plan or Barriers: Pt seeking treatment for alcohol abuse-pt reports that he is not sure if he wants inpatient or outpatient treatment. [facility] this morning by CSW. Pt also given info to ADS for [facility] services. Pt reports that he has been living in his truck or in motels for the past 2 months after losing his job.  Additional comments:  [facility] alcohol abuse. Pt reports that he drinks a 1/2 of gallon daily and have been drinking since the his mid 1420s. Pt reports that the longest he's been sober is for 8 months. 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 Pt reports that he is homeless and has no place to go. Pt seeking long-term treatment for substance abuse. Pt reports having withdrawal symptoms of tactile hallucinations, sweats, tremors, pin and needles sensation and anxiety. Pt denies using any illegal substances. Pt reports passive suicidal thoughts and verbally contracts not to harm self. Pt has a upcoming courtdate scheduled for April 28 th, due to 2nd degree trespassing charge. Pt v/s taken, skin assessed, belongings searched and required documents  signed. Pt safely brought onto the unit.  Reason for Continuation of Hospitalization: Ativan taper-withdrawals Mood instability/depression/passive SI Medication management  Estimated length of stay: 3-5 days  For review of initial/current patient goals, please see plan of care.  Attendees:  Patient:    Family:    Physician: Thedore MinsMojeed Akintayo MD 09/13/2014 8:21 AM   Nursing: Philipp OvensJanet, Patty RN 09/13/2014 8:21 AM   Clinical Social Worker Decarlos Empey Smart, LCSWA  09/13/2014 8:21 AM   Other: Eileen StanfordQuylll H. LCSWEarley Abide; Kristin D. LCSWA 09/13/2014 8:21 AM   Other: Darden DatesJennifer C. Nurse CM 09/13/2014 8:21 AM   Other: Liliane Badeolora Sutton, Community Care Coordinator  09/13/2014 8:21 AM   Other:    Scribe for Treatment Team:  The Sherwin-WilliamsHeather Smart LCSWA 09/13/2014 8:22 AM

## 2014-09-14 ENCOUNTER — Encounter (HOSPITAL_COMMUNITY): Payer: Self-pay | Admitting: Registered Nurse

## 2014-09-14 MED ORDER — METOPROLOL TARTRATE 25 MG PO TABS
12.5000 mg | ORAL_TABLET | Freq: Two times a day (BID) | ORAL | Status: DC
  Administered 2014-09-14 – 2014-09-15 (×3): 12.5 mg via ORAL
  Administered 2014-09-15: 25 mg via ORAL
  Administered 2014-09-16 – 2014-09-18 (×6): 12.5 mg via ORAL
  Filled 2014-09-14 (×4): qty 1
  Filled 2014-09-14: qty 14
  Filled 2014-09-14 (×6): qty 1
  Filled 2014-09-14: qty 14
  Filled 2014-09-14 (×2): qty 1

## 2014-09-14 NOTE — Progress Notes (Signed)
Patient did attend the evening speaker AA meeting.  

## 2014-09-14 NOTE — [facility] Group Notes (Signed)
[facility] Group Notes:  (Clinical Social Work)  09/14/2014   1:15-2:15PM  Summary of Progress/Problems:   The main focus of today's process group was for the patient to identify ways in which they have sabotaged their own mental health wellness/recovery.  Motivational interviewing and a handout were used to explore the benefits and costs of their self-sabotaging behavior as well as the benefits and costs of changing this behavior.  The Stages of Change were explained to the group using a handout, and patients identified where they are with regard to changing self-defeating behaviors.  The patient expressed he self-sabotages with isolation/withdrawal and drugs/alcohol.  He did not participate much in the group discussion, actually appeared to fall asleep.  He left the room when summoned to speak to a practitioner, then returned.  He tore apart what appeared to be a heat pack, and clinician took it away from him.  He stated he wanted to see what it was made of.  Type of Therapy:  Process Group  Participation Level:  Active  Participation Quality:  Drowsy and Inattentive  Affect:  Flat  Cognitive:  Oriented  Insight:  Limited  Engagement in Therapy:  Limited  Modes of Intervention:  Education, Motivational Interviewing   Ambrose MantleMareida Grossman-Orr, LCSW 09/14/2014, 4:00pm

## 2014-09-14 NOTE — Progress Notes (Signed)
Psychoeducational Group Note  Date: 09/14/2014 Time:  1015  Group Topic/Focus:  Identifying Needs:   The focus of this group is to help patients identify their personal needs that have been historically problematic and identify healthy behaviors to address their needs.  Participation Level:  Active  Participation Quality:  Appropriate  Affect:  Appropriate  Cognitive:  Oriented  Insight:  Improving  Engagement in Group:  Engaged  Additional Comments:  Pt was active in the group and partisipated  Tanveer Dobberstein A 

## 2014-09-14 NOTE — Plan of Care (Signed)
Problem: Ineffective individual coping Goal: STG: Patient will remain free from self harm Outcome: Progressing Patient remains free from self harm. 15 minute checks continued per protocol for patient safety.   Problem: Alteration in mood & ability to function due to Goal: STG-Patient will comply with prescribed medication regimen (Patient will comply with prescribed medication regimen)  Outcome: Progressing Patient has adhered to medication regimen today with ease.   Problem: Diagnosis: Increased Risk For Suicide Attempt Goal: STG-Patient Will Attend All Groups On The Unit Outcome: Progressing Patient is attending unit groups.  Problem: Alteration in mood & ability to function due to Goal: LTG-Pt reports reduction in suicidal thoughts (Patient reports reduction in suicidal thoughts and is able to verbalize a safety plan for whenever patient is feeling suicidal)  Outcome: Not Progressing Patient continues to endorse suicidal ideation. Pt verbally contracts for safety today.
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
## 2014-09-14 NOTE — Progress Notes (Signed)
D: Patient is alert and oriented. Pt's mood and affect is depressed and blunted. Pt denies HI and AVH. Pt reports passive SI and states "you can't do it in here." Pt rates depression, hopelessness, and anxiety 10/10. Pt reports withdrawal symptoms including "skin crawling, sweats, anxiety, and agitation." Pt reports chronic lower back pain 10/10. Pt experiencing HTN today (See docflowsheet-vitals). Pt reports his goal for the day is to "work on depression, coping skills." Pt is attending unit groups. Pt complains of 10/10 chronic back pain with no relief from PRN medication, pt given heat pack, pt opened heat pack and it's contents, LCSW Mareida removed heat pack from pt's possession.  A: NP, Shuvon made aware of pt's HTN, new orders acknowledged, will reassess BP frequently. Medication education reviewed with pt. PRN medication administered for anxiety and pain per providers orders (See MAR). Scheduled medications administered per providers orders (See MAR). 15 minute checks continued per protocol for patient safety.  R: Pt verbally contracts for safety, agrees not to harm self, and agrees to come to staff with increased intensity of suicidal thoughts. Patient cooperative and receptive to nursing interventions. Pt remains safe.

## 2014-09-14 NOTE — [facility] Group Notes (Signed)
[facility] Group Notes:  (Nursing/MHT/Case Management/Adjunct)  Date:  09/14/2014  Time:  0930am  Type of Therapy:  Nurse Education  Participation Level:  Minimal  Participation Quality:  Attentive  Affect:  Blunted  Cognitive:  Alert and Appropriate  Insight:  Appropriate  Engagement in Group:  Lacking  Modes of Intervention:  Discussion, Education and Support  Summary of Progress/Problems: Patient attended group, was drowsy, and responded minimally with prompted. Pt reports his goal for the day is "read the packet and get something out of it."  Laurine BlazerGuthrie, GrenadaBrittany A 09/14/2014, 12:36 PM

## 2014-09-14 NOTE — Progress Notes (Signed)
D: Patient seen on day room his visitor watching TV. Patient denies pain, SI, AH/VH but says that "I will like to get vistaril, Ativan, Pain pill and sleep pill; anything I can get tonight nurse". Bp rechecked 142/94, P 98. Patient stable. No distress noted. A: Support and encouragement offered. Medications given as ordered. Every 15 minutes check for safety maintained. Will continue to monitor patient.  R: Patient receptive to interventions.

## 2014-09-15 MED ORDER — METAXALONE 800 MG PO TABS
400.0000 mg | ORAL_TABLET | Freq: Four times a day (QID) | ORAL | Status: DC | PRN
  Administered 2014-09-16 – 2014-09-17 (×4): 400 mg via ORAL
  Filled 2014-09-15 (×3): qty 1

## 2014-09-15 MED ORDER — GABAPENTIN 300 MG PO CAPS
300.0000 mg | ORAL_CAPSULE | Freq: Three times a day (TID) | ORAL | Status: DC
  Administered 2014-09-16 – 2014-09-17 (×4): 300 mg via ORAL
  Filled 2014-09-15 (×7): qty 1

## 2014-09-15 MED ORDER — HYDROXYZINE HCL 50 MG PO TABS
50.0000 mg | ORAL_TABLET | Freq: Four times a day (QID) | ORAL | Status: AC | PRN
  Administered 2014-09-15: 50 mg via ORAL
  Filled 2014-09-15: qty 1

## 2014-09-15 MED ORDER — FLUOXETINE HCL 10 MG PO CAPS
10.0000 mg | ORAL_CAPSULE | Freq: Every day | ORAL | Status: DC
  Administered 2014-09-15 – 2014-09-17 (×3): 10 mg via ORAL
  Filled 2014-09-15 (×6): qty 1

## 2014-09-15 NOTE — [facility] Group Notes (Signed)
[facility] Group Notes: (Clinical Social Work)  09/15/2014 1:15-2:00PM  Summary of Progress/Problems: The main focus of today's process group was to  1) discuss the importance of adding supports 2) define healthy supports versus unhealthy supports 3) identify the patient's current healthy supports and brainstorm what to add 4) discuss self-support and how difficult it is to support oneself, helping patients to understand the difficulties family members face with this issue.  Motivational Interviewing was used, particularly rolling with resistance as most of the group was quite resistant to change.  The patient stated that his family, including brother, sister, mother, cousins, and nephews, along with AA and friends are his supports.  When another pt talked about how he cannot drink just one beer, but if he had a 6-pack and took one, he would end up drinking them all, this patient said he would drink the other five, laughed.  Type of Therapy: Process Group with Motivational Interviewing  Participation Level: Active  Participation Quality: Attentive and Sharing  Affect: Blunted and Depressed  Cognitive: Alert  Insight: Developing/Improving  Engagement in Therapy: Improving  Modes of Intervention: Motivational Interviewing, Processing  Ambrose MantleMareida Grossman-Orr, LCSW 09/15/2014, 3:15pm

## 2014-09-15 NOTE — [facility] Group Notes (Signed)
[facility] Group Notes:  Healthy support system  Date:  09/15/2014  Time:  10:22 AM  Type of Therapy:  Nurse Education  Participation Level:  Active  Participation Quality:  Appropriate  Affect:  Appropriate  Cognitive:  Appropriate  Insight:  Appropriate  Engagement in Group:  Engaged  Modes of Intervention:  Discussion  Summary of Progress/Problems:Pt stated his three pillars are: god, work and his friends.   Rodman KeyWebb, Anirudh Baiz [facility]ena[facility] 09/15/2014, 10:22 AM

## 2014-09-15 NOTE — Progress Notes (Signed)
Psychoeducational Group Note  Date:  09/15/2014 Time:  1015  Group Topic/Focus:  Making Healthy Choices:   The focus of this group is to help patients identify negative/unhealthy choices they were using prior to admission and identify positive/healthier coping strategies to replace them upon discharge.  Participation Level:  Active  Participation Quality:  Appropriate  Affect:  Appropriate  Cognitive:  Oriented  Insight:  Improving  Engagement in Group:  Engaged  Additional Comments:  Attentive and participating in the group  Atzin Buchta A 09/15/2014 

## 2014-09-15 NOTE — Progress Notes (Addendum)
Pt stated he felt he needed an increase in his medications. NP made aware. Pt does contract for safety but does appear to be med seeking.He has been in the dayroom with the other pts. He denies SI and HI. 4p-Pt continues to appear to be  med seeking asking for higher doses of all of his prns meds,. Pt stated he needs for the protocal drugs to be increased. He was informed that generally the medications are titrated down.Pt appears very needy and comes to the medication window whenever he sees that another pt is there. Pt requested a lists of all of his medications and asked the nurse what he took each medication for.He stated,"I know I need these medications doses increased." pt does not exhibit any withdrawal symptoms as this time. Pt stated he wants to understand depression and why he is going through it. He rates his depression a 9/10 and anxiety a 9/10. Pt appears very social on the unit . He constantly is requesting any medication he can have. Pt stated he is having cravings, cramping, chills, tremors although none have been observed and irritabilty.

## 2014-09-15 NOTE — Progress Notes (Signed)
Patient did attend the evening speaker AA meeting.  
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
## 2014-09-16 DIAGNOSIS — F10230 Alcohol dependence with withdrawal, uncomplicated: Secondary | ICD-10-CM

## 2014-09-16 DIAGNOSIS — F39 Unspecified mood [affective] disorder: Secondary | ICD-10-CM

## 2014-09-16 DIAGNOSIS — F329 Major depressive disorder, single episode, unspecified: Secondary | ICD-10-CM

## 2014-09-16 DIAGNOSIS — F419 Anxiety disorder, unspecified: Secondary | ICD-10-CM

## 2014-09-16 DIAGNOSIS — Y909 Presence of alcohol in blood, level not specified: Secondary | ICD-10-CM

## 2014-09-16 MED ORDER — HYDROXYZINE HCL 50 MG PO TABS
50.0000 mg | ORAL_TABLET | Freq: Four times a day (QID) | ORAL | Status: DC | PRN
  Administered 2014-09-16 – 2014-09-17 (×2): 50 mg via ORAL
  Filled 2014-09-16 (×2): qty 1

## 2014-09-16 NOTE — Progress Notes (Signed)
D: Patient continue to be med seeking. Requesting to get all his PRN medications at the same time. Patient came to the medication window with the list of all his medications listing it out and requesting this writer to get it out and give it to him. "Vistaril 50 mg, Prozac 10 mg, ativan 1 mg, skelaxin 400 mg, Lopressor 12.5 mg, naprosyn 500 mg, trazodone 100 mg". Patient asked this writer if he is missing out any of his medications. Patient complains body pain, endorses SI 10/10 and depression 10/10. Patient stated "I know am not gonna do it here but will do it as soon as I leave this place". Patient stated he is still having withdrawals stretching out his hands for this writer to see. Severe tremor observed as the patient stretches out his hands (patient faking the tremor). Patient reports anxiety, agitation, sweating, insomnia, loss of appettite, "I'm not doing any better, I need all MY MEDS INCREASED".  A: Patient was made to understand the medications he can and cannot get tonight. Education offered. Safety maintained at all times. Will continue to monitor.  R: Patient not receptive to education. Patient is safe.

## 2014-09-16 NOTE — Progress Notes (Signed)
Patient on the hallway at the beginning of this shift. He requested for medications for withdrawals. Patient stated that his Ativan was discontinues and asked Clinical research associatewriter if Clinical research associatewriter could make a call to the physician and have him reorder the Ativan." I need my PRN, I still have withdrawal symptoms". Writer encouraged and supported patient. Q 15 minute check continues as ordered to maintain safety.

## 2014-09-16 NOTE — Progress Notes (Addendum)
D:  Patient's self inventory sheet, patient sleep fair, sleep medication is helpful.  Fair appetite, low energy level, poor concentration.  Rated depression, hopeless and anxiety #9.  Experiencing withdrawals of tremors, chilling, cravings, cramping, agitation, nausea, runny nose, irritability, pins and needles, crawlies on skin, feet sore numb.  Physical problems lightheaded, pain, dizziness, headaches, blurred vision.  Physical pain, worst pain #9, neck, back, left shoulder, feet.  Pain medication is helpful.  Goal is "my depression and stress management".  Plans to go to group and sleep.  No discharge plans.  No problems anticipated after discharge. A:  Medications administered per MD orders.  Emotional support and encouragement given patient. R:  SI with no plan.  Contracts for safety.  Denied HI.  Denied A/V hallucinations.  Safety maintained with 15 minute checks. Patient was given list of prn medications available to him this morning.  Patient given skelaxin 400 mg for back spasms and naprosyn 500 mg for neck pain.  Patient given list of scheduled medications this morning and also list of PRN medications.    1755  Patient has continued to ask for all prn's available to him throughout the day.  Patient continues to ask for pain medication/anxiety medication.  Patient has been seen in the dayroom laughing, talking to peers, playing cards, went out for recreation activities this afternoon.  No signs/symptoms of pain/distress noted on patient's face.  Respirations even and unlabored.  Safety maintained with 15 minute checks.

## 2014-09-16 NOTE — [facility] Group Notes (Signed)
[facility]   09/16/2014 9:22 AM  Participation Quality:  Pt invited-Stated that he wanted to change clothes first but never came back to group.   Smart, American FinancialHeather LCSWA

## 2014-09-16 NOTE — Progress Notes (Signed)
Patient did attend the evening speaker AA meeting.  
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
## 2014-09-16 NOTE — Plan of Care (Signed)
Problem: Consults Goal: Suicide Risk Patient Education (See Patient Education module for education specifics)  Outcome: Completed/Met Date Met:  09/16/14 Nurse discussed SI thoughts and copying skills with patient.     

## 2014-09-16 NOTE — [facility] Group Notes (Signed)
[facility] LCSW Group Therapy  09/16/2014 2:14 PM  Type of Therapy:  Group Therapy  Participation Level:  Active  Participation Quality:  Attentive  Affect:  Appropriate  Cognitive:  Alert and Oriented  Insight:  Engaged  Engagement in Therapy:  Engaged  Modes of Intervention:  Confrontation, Discussion, Education, Exploration, Problem-solving, Rapport Building, Socialization and Support  Summary of Progress/Problems: Today's Topic: Overcoming Obstacles. Pt identified obstacles faced currently and processed barriers involved in overcoming these obstacles. Pt identified steps necessary for overcoming these obstacles and explored motivation (internal and external) for facing these difficulties head on. Pt further identified one area of concern in their lives and chose a skill of focus pulled from their "toolbox." Jonny RuizJohn was attentive and engaged during today's processing group. He shared that his biggest obstacle was "financial stress." "I've been homeless and living in my truck. I get depressed and drink to deal with my depression." Jonny RuizJohn demonstrates improving insight AEB his ability to process how his alcohol abuse further exacerbates his financial problems and inhibits him from overcoming this obstacle. Tylerjames was happy to learn that he was tentatively accepted [facility]. He identified his girlfriend as a positive support.   Smart, Titiana Severa LCSWA  09/16/2014, 2:14 PM

## 2014-09-16 NOTE — Progress Notes (Signed)
Recreation Therapy Notes  Date: 04.04.2016 Time: 9:30am Location: [address] Day[address]   Group Topic: Stress Management  Goal Area(s) Addresses:  Patient will actively participate in stress management techniques presented during session.   Behavioral Response: Engaged, Appropriate   Intervention: Stress management techniques   Activity :  Deep Breathing and Progressive Muscle Relaxation. Patients were provided education and instruction for completing both deep breathing and progressive muscle relaxation.   Education:  Stress Management, Discharge Planning.   Education Outcome: Acknowledges education  Clinical Observations/Feedback: Patient actively participated in both techniques, displayed ability to practice independently post d/c and expressed no concerns.    Brent Gay, LRT/CTRS  Echo Propp L 09/16/2014 3:46 PM 

## 2014-09-17 MED ORDER — FLUOXETINE HCL 20 MG PO CAPS
20.0000 mg | ORAL_CAPSULE | Freq: Every day | ORAL | Status: DC
  Administered 2014-09-18: 20 mg via ORAL
  Filled 2014-09-17: qty 14
  Filled 2014-09-17 (×2): qty 1

## 2014-09-17 MED ORDER — GABAPENTIN 400 MG PO CAPS
400.0000 mg | ORAL_CAPSULE | Freq: Three times a day (TID) | ORAL | Status: DC
  Administered 2014-09-17 – 2014-09-18 (×4): 400 mg via ORAL
  Filled 2014-09-17 (×6): qty 1

## 2014-09-17 MED ORDER — METAXALONE 400 MG HALF TABLET
400.0000 mg | ORAL_TABLET | Freq: Four times a day (QID) | ORAL | Status: DC
  Administered 2014-09-17 – 2014-09-18 (×7): 400 mg via ORAL
  Filled 2014-09-17 (×13): qty 1

## 2014-09-17 MED ORDER — HYDROXYZINE HCL 50 MG PO TABS
50.0000 mg | ORAL_TABLET | Freq: Four times a day (QID) | ORAL | Status: DC
  Administered 2014-09-17 – 2014-09-18 (×7): 50 mg via ORAL
  Filled 2014-09-17 (×14): qty 1

## 2014-09-17 NOTE — Progress Notes (Signed)
Recreation Therapy Notes  Animal-Assisted Activity (AAA) Program Checklist/Progress Notes Patient Eligibility Criteria Checklist & Daily Group note for Rec Tx Intervention  Date: 04.05.2016 Time: 2:45pm [address]   AAA/T Program Assumption of Risk Form signed by Patient/ or Parent Legal Guardian yes  Patient is free of allergies or sever asthma yes  Patient reports no fear of animals yes  Patient reports no history of cruelty to animals yes  Patient understands his/her participation is voluntary yes  Patient washes hands before animal contact yes  Patient washes hands after animal contact yes  Behavioral Response: Appropriate   Education: Hand Washing, Appropriate Animal Interaction   Education Outcome: Acknowledges education.   Clinical Observations/Feedback: Patient actively engaged in session, petting therapy dog appropriately and interacting with peers appropriately during session.   Marykay Lexenise L Correy Weidner, LRT/CTRS  Casy Tavano L 09/17/2014 5:14 PM

## 2014-09-17 NOTE — [facility] Group Notes (Signed)
[facility] LCSW Group Therapy  09/17/2014 1:35 PM  Type of Therapy:  Group Therapy  Participation Level:  Active  Participation Quality:  Attentive  Affect:  Appropriate  Cognitive:  Alert and Oriented  Insight:  Improving  Engagement in Therapy:  Improving  Modes of Intervention:  Discussion, Education, Exploration, Problem-solving, Rapport Building, Socialization and Support  Summary of Progress/Problems: MHA Speaker came to talk about his personal journey with substance abuse and addiction. The pt processed ways by which to relate to the speaker. MHA speaker provided handouts and educational information pertaining to groups and services offered by the [facility].   Smart, Wilhelmenia Addis LCSWA 09/17/2014, 1:35 PM

## 2014-09-17 NOTE — [facility] Group Notes (Signed)
Adult Psychoeducational Group Note  Date:  09/17/2014 Time:  10:04 PM  Group Topic/Focus:  Wrap-Up Group:   The focus of this group is to help patients review their daily goal of treatment and discuss progress on daily workbooks.  Participation Level:  Minimal  Participation Quality:  Attentive  Affect:  Appropriate  Cognitive:  Appropriate  Insight: Limited  Engagement in Group:  Limited  Modes of Intervention:  Discussion  Additional Comments:  Jonny RuizJohn said his day was pretty good.  [facility] also [facility]  Caroll RancherLindsay, Maude Gloor A 09/17/2014, 10:04 PM
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
## 2014-09-17 NOTE — Progress Notes (Signed)
D:  Patient continues to ask about his medications, prn medications, etc.  Patient denied SI and HI, contracts for safety.  Denied A/V hallucinations.   A:  Medications administered per MD orders.  Emotional support and encouragement given patient. R:  Safety maintained with 15 minute checks. [facility]

## 2014-09-17 NOTE — [facility] Group Notes (Signed)
The focus of this group is to educate the patient on the purpose and policies of crisis stabilization and provide a format to answer questions about their admission.  The group details unit policies and expectations of patients while admitted.  Patient did not attend 0900 nurse education orientation group this morning.  Patient stayed in his room. 

## 2014-09-17 NOTE — Plan of Care (Signed)
Problem: Consults Goal: Substance Abuse Patient Education See Patient Education Module for education specifics.  Outcome: Completed/Met Date Met:  09/17/14 Nurse discussed coping skills with patient.     

## 2014-09-18 ENCOUNTER — Encounter (HOSPITAL_COMMUNITY): Payer: Self-pay | Admitting: Registered Nurse

## 2014-09-18 MED ORDER — BUSPIRONE HCL 15 MG PO TABS
15.0000 mg | ORAL_TABLET | Freq: Two times a day (BID) | ORAL | Status: DC
  Administered 2014-09-18: 15 mg via ORAL
  Filled 2014-09-18: qty 28
  Filled 2014-09-18: qty 1
  Filled 2014-09-18: qty 28
  Filled 2014-09-18: qty 1
  Filled 2014-09-18: qty 3

## 2014-09-18 MED ORDER — GABAPENTIN 600 MG PO TABS
600.0000 mg | ORAL_TABLET | Freq: Three times a day (TID) | ORAL | Status: DC
  Filled 2014-09-18 (×2): qty 42

## 2014-09-18 MED ORDER — METAXALONE 800 MG PO TABS
400.0000 mg | ORAL_TABLET | Freq: Four times a day (QID) | ORAL | Status: DC | PRN
  Filled 2014-09-18: qty 20

## 2014-09-18 MED ORDER — GABAPENTIN 400 MG PO CAPS: 400.0000 mg | ORAL_CAPSULE | Freq: Three times a day (TID) | ORAL | Status: DC

## 2014-09-18 MED ORDER — HYDROXYZINE HCL 50 MG PO TABS: 50.0000 mg | ORAL_TABLET | Freq: Four times a day (QID) | ORAL | Status: AC | PRN

## 2014-09-18 MED ORDER — METAXALONE 400 MG PO TABS: 400.0000 mg | ORAL_TABLET | Freq: Four times a day (QID) | ORAL | Status: AC | PRN

## 2014-09-18 MED ORDER — NAPROXEN 500 MG PO TABS
500.0000 mg | ORAL_TABLET | Freq: Three times a day (TID) | ORAL | Status: DC
  Administered 2014-09-18 – 2014-09-19 (×2): 500 mg via ORAL
  Filled 2014-09-18 (×7): qty 1

## 2014-09-18 MED ORDER — BUSPIRONE HCL 15 MG PO TABS: 15.0000 mg | ORAL_TABLET | Freq: Two times a day (BID) | ORAL | Status: AC

## 2014-09-18 MED ORDER — FLUOXETINE HCL 20 MG PO CAPS: 20.0000 mg | ORAL_CAPSULE | Freq: Every day | ORAL | Status: AC

## 2014-09-18 MED ORDER — TRAZODONE HCL 100 MG PO TABS
100.0000 mg | ORAL_TABLET | Freq: Every day | ORAL | Status: DC
  Administered 2014-09-18: 100 mg via ORAL
  Filled 2014-09-18 (×3): qty 1

## 2014-09-18 MED ORDER — GABAPENTIN 300 MG PO CAPS
600.0000 mg | ORAL_CAPSULE | Freq: Three times a day (TID) | ORAL | Status: DC
  Administered 2014-09-18: 600 mg via ORAL
  Filled 2014-09-18 (×5): qty 2

## 2014-09-18 MED ORDER — NAPROXEN 500 MG PO TABS: 500.0000 mg | ORAL_TABLET | Freq: Three times a day (TID) | ORAL | Status: AC | PRN

## 2014-09-18 MED ORDER — HYDROXYZINE HCL 50 MG PO TABS
50.0000 mg | ORAL_TABLET | Freq: Four times a day (QID) | ORAL | Status: DC | PRN
  Filled 2014-09-18: qty 30

## 2014-09-18 MED ORDER — GABAPENTIN 300 MG PO CAPS: 600.0000 mg | ORAL_CAPSULE | Freq: Three times a day (TID) | ORAL | Status: AC
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
## 2014-09-18 NOTE — Progress Notes (Signed)
Patient ID: Brent Gay HeightJohn F Baetz, male   DOB: 08/29/1965, 49 y.o.   MRN: [id number] D: Patient denies SI/HI and auditory and visual hallucinations. Patient reports depression and hopelessness  anxiety. Depression 5 on 1 to 10 scale. Med seeking and demanding.  A: Patient given emotional support from RN. Patient given medications per MD orders. PRN given for anxiety. Patient encouraged to attend groups and unit activities. Patient encouraged to come to staff with any questions or concerns.  R: Patient remains cooperative. Demanding.  Participating in groups, interacting with peers.Will continue to monitor patient for safety.

## 2014-09-18 NOTE — [facility] Group Notes (Signed)
[facility] LCSW Group Therapy  09/18/2014 4:32 PM  Type of Therapy:  Group Therapy  Participation Level:  Active  Participation Quality:  Attentive  Affect:  Appropriate  Cognitive:  Alert and Oriented  Insight:  Engaged  Engagement in Therapy:  Engaged  Modes of Intervention:  Confrontation, Discussion, Education, Exploration, Problem-solving, Rapport Building, Socialization and Support  Summary of Progress/Problems: Emotion Regulation: This group focused on both positive and negative emotion identification and allowed group members to process ways to identify feelings, regulate negative emotions, and find healthy ways to manage internal/external emotions. Group members were asked to reflect on a time when their reaction to an emotion led to a negative outcome and explored how alternative responses using emotion regulation would have benefited them. Group members were also asked to discuss a time when emotion regulation was utilized when a negative emotion was experienced. Brent Gay was attentive and engaged during today's processing group. He shared that he struggles with depression "when things don't go well for me." Pt reports that he drinks in an attempt to manage his depression but acknowledged that drinking tends to make depressive Sx worse. Brent Gay continues to show progress in the group setting and improving insight in the group setting and stated that he was looking forward to learning more coping skills at [facility].   Smart, [name] LCSWA  09/18/2014, 4:32 PM

## 2014-09-18 NOTE — [facility] Suicide Risk Assessment (Signed)
[facility] INPATIENT:  Family/Significant Other Suicide Prevention Education  Suicide Prevention Education:  Education Completed; Chinita GreenlandSarah Hogan (pt's girlfriend) [phone] has been identified by the patient as the family member/significant other with whom the patient will be residing, and identified as the person(s) who will aid the patient in the event of a mental health crisis (suicidal ideations/suicide attempt).  With written consent from the patient, the family member/significant other has been provided the following suicide prevention education, prior to the and/or following the discharge of the patient.  The suicide prevention education provided includes the following:  Suicide risk factors  Suicide prevention and interventions  National Suicide Hotline telephone number  [facility] assessment telephone number  [facility] Emergency Assistance 911  [facility] and/or Residential Mobile Crisis Unit telephone number  Request made of family/significant other to:  Remove weapons (e.g., guns, rifles, knives), all items previously/currently identified as safety concern.    Remove drugs/medications (over-the-counter, prescriptions, illicit drugs), all items previously/currently identified as a safety concern.  The family member/significant other verbalizes understanding of the suicide prevention education information provided.  The family member/significant other agrees to remove the items of safety concern listed above.  Smart, Melessa Cowell LCSWA 09/18/2014, 1:04 PM

## 2014-09-18 NOTE — Progress Notes (Signed)
D: Patient in the dayroom on approach.  Patient states he had a good day.  Patient state she is ready to [facility].  Patient discharge instructions gone over tonight along with his RX and sample medications.  Patient states his goal was to work on his discharged plan.  Patient denies SI/HI and denies AVH.   A: Staff to monitor Q 15 mins for safety.  Encouragement and support offered.  Scheduled medications administered per orders.   R: Patient remains safe on the unit.  Patient attended group tonight.  Patient visible on the unit and interacting with peers.  Patient taking administered medications.
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
## 2014-09-18 NOTE — [facility] Suicide Risk Assessment (Signed)
[facility]   Demographic Factors:  Male and Caucasian  Total Time spent with patient: 30 minutes  Musculoskeletal: Strength & Muscle Tone: within normal limits Gait & Station: normal Patient leans: N/A  Psychiatric Specialty Exam: Physical Exam  Review of Systems  Constitutional: Negative.   HENT: Negative.   Eyes: Negative.   Respiratory: Negative.   Cardiovascular: Negative.   Gastrointestinal: Negative.   Genitourinary: Negative.   Musculoskeletal: Positive for back pain.  Skin: Negative.   Neurological: Negative.   Endo/Heme/Allergies: Negative.   Psychiatric/Behavioral: Positive for substance abuse. The patient is nervous/anxious.     Blood pressure 128/77, pulse 74, temperature 99.5 F (37.5 C), temperature source Oral, resp. rate 18, height 5' 10.08" (1.78 m), weight 92.987 kg (205 lb), SpO2 99 %.Body mass index is 29.35 kg/(m^2).  General Appearance: Fairly Groomed  Patent attorneyye Contact::  Fair  Speech:  Clear and Coherent409  Volume:  Normal  Mood:  Anxious  Affect:  Appropriate  Thought Process:  Coherent and Goal Directed  Orientation:  Full (Time, Place, and Person)  Thought Content:  plans as he moves on, relapse prevention plan  Suicidal Thoughts:  No  Homicidal Thoughts:  No  Memory:  Immediate;   Fair Recent;   Fair Remote;   Fair  Judgement:  Fair  Insight:  Present  Psychomotor Activity:  Restlessness  Concentration:  Fair  Recall:  [employer]  Fund of Knowledge:Fair  Language: Fair  Akathisia:  No  Handed:  Right  AIMS (if indicated):     Assets:  Desire for Improvement  Sleep:  Number of Hours: 5.75  Cognition: WNL  ADL's:  Intact      Has this patient used any form of tobacco in the last 30 days? (Cigarettes, Smokeless Tobacco, Cigars, and/or Pipes) Yes, A prescription for an FDA-approved tobacco cessation medication was offered at discharge and the patient refused  Mental Status Per Nursing Assessment::   On Admission:   Suicidal ideation indicated by patient, Self-harm thoughts  Current Mental Status by Physician: IN full contact with reality. There are no active SI plans or intent. There are no active S/S of withdrawal. He is willing and motivated [facility]   Loss Factors: Decline in physical health  Historical Factors: NA  Risk Reduction Factors:   Sense of responsibility to family and Positive social support  Continued Clinical Symptoms:  Severe Anxiety and/or Agitation Depression:   Comorbid alcohol abuse/dependence Alcohol/Substance Abuse/Dependencies  Cognitive Features That Contribute To Risk:  Closed-mindedness, Polarized thinking and Thought constriction (tunnel vision)    Suicide Risk:  Minimal: No identifiable suicidal ideation.  Patients presenting with no risk factors but with morbid ruminations; may be classified as minimal risk based on the severity of the depressive symptoms  Principal Problem: Alcohol dependence with withdrawal Discharge Diagnoses:  Patient Active Problem List   Diagnosis Date Noted  . GAD (generalized anxiety disorder) [F41.1] 12/25/2013    Priority: High  . Alcohol dependence with withdrawal [F10.239] 06/05/2012    Priority: High  . Alcohol withdrawal [F10.239] 06/05/2012    Priority: High  . Cocaine dependence [F14.20] 06/05/2012    Priority: High  . 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 Epididymal cyst [N50.3] 08/13/2014  . Essential hypertension [I10]   . Sinus tachycardia [I47.1] 08/12/2014  . Hypokalemia [E87.6] 08/12/2014  . Alcohol dependence with uncomplicated withdrawal [F10.230] 08/05/2014  . Alcohol withdrawal syndrome with complication [F10.239] 06/15/2014  . Alcohol use disorder, severe, dependence [  F10.20]   . Substance induced mood disorder [F19.94]   . Major depressive disorder, severe [F32.2]   . 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 Suicidal ideation [R45.851]   . Chronic  pain syndrome [G89.4] 01/23/2014  . Change in blood platelet count [D69.6] 08/02/2013  . Anxiety disorder [F41.9] 06/06/2012  . Polysubstance abuse [F19.10] 06/04/2012    Class: Acute  . Polysubstance dependence including opioid type drug, continuous use [F11.229] 06/04/2012    Class: Acute  . Cocaine abuse [F14.10] 06/03/2012    Class: Chronic  . Chest pain [R07.9] 04/25/2012  . Acute inflammation of the pancreas [K85.9] 08/24/2011  . Alcohol abuse [F10.10] 10/15/2009  . CHEST PAIN, ATYPICAL [R07.89] 10/15/2009    Follow-up Information    [facility] .   Why:  If interested in outpatient medication management/therapy/SA Intensive Outpatient, please contact Noni Saupe ext [phone] to schedule assessment for services after completing inpatient treatment.    Contact information:   [address] [place], [place] [address] Phone: [phone] Fax: [phone]       Follow up with [facility]  On 09/19/2014.   Why:  Appt for screening and possible admission on this date at 8:00AM. Please bring photo ID to this appt.    Contact information:   [address] [place], [place] [address] Phone: [phone] Fax: [phone]      Plan Of Care/Follow-up recommendations:  Activity:  as tolerated Diet:  heart healthy Follow up [facility] program and [facility] Is patient on multiple antipsychotic therapies at discharge:  No   Has Patient had three or more failed trials of antipsychotic monotherapy by history:  No  Recommended Plan for Multiple Antipsychotic Therapies: NA    Lainie Daubert A 09/18/2014, 6:22 PM

## 2014-09-18 NOTE — Progress Notes (Signed)
Pt affect/ mood anxious, depressed and worried.  -SI/HI, -A/[place], verbally contracts for safety. Pt attended group and interacted with peers. Pt continues to be demanding and med-seeking. Per pt request Clinical research associatewriter gave a written list of presently prescribed medications. C/o back pain and anxiety, see MAR.  Emotional support and encouragement given. Will continue to monitor closely and evaluate for stabilization.

## 2014-09-18 NOTE — Progress Notes (Signed)
  [facility] :  Will you be returning to the same living situation after discharge:  No. Screening and possible [facility] for 09/19/14 At discharge, do you have transportation home?: Yes,  another pt's support will transport him to facility at 7:15AM on Thursday 09/19/14 Do you have the ability to pay for your medications: Yes,  mental health  Release of information consent forms completed and submitted to medical records by CSW.  Patient to Follow up at: Follow-up Information    [facility] .   Why:  If interested in outpatient medication management/therapy/SA Intensive Outpatient, please contact Noni SaupeJamie Duvall ext [phone] to schedule assessment for services after completing inpatient treatment.    Contact information:   [address] [place], [place] [address] Phone: [phone] Fax: [phone]       [facility].   Why:  Appt for screening and possible admission on this date at 8:00AM. Please bring photo ID to this appt.    Contact information:   [address] [place], [place] [address] Phone: [phone] Fax: [phone]      Patient denies SI/HI: Yes, during group/self report.   Safety Planning and Suicide Prevention discussed: Yes,  SPE completed with pt's girlfriend. SPI pamphlet provided to pt and she was encouraged to share information with support network, ask questions, and talk about any concerns relating to SPE.     Has patient been referred to the Quitline?: Patient refused referral  Smart, [name] LCSWA  09/18/2014, 11:28 AM

## 2014-09-18 NOTE — Progress Notes (Signed)
Recreation Therapy Notes  Date: 04.06.2016 Time: 9:30am Location: [address] Group Room   Group Topic: Stress Management  Goal Area(s) Addresses:  Patient will actively participate in stress management techniques presented during session.   Behavioral Response: Did not attend.   Timika Muench L Karon Heckendorn, LRT/CTRS  Jasira Robinson L 09/18/2014 2:16 PM 

## 2014-09-18 NOTE — [facility] Group Notes (Signed)
[facility] LCS[facility] Discharge Planning Group Note   09/18/2014 11:07 AM  Participation Quality:  Appropriate   Mood/Affect:  Appropriate  Depression Rating:  0  Anxiety Rating:  0  Thoughts of Suicide:  No Will you contract for safety?   NA  Current AVH:  No  Plan for Discharge/Comments:  Pt reports that he is ready to [facility] and is aware that this is a screening for admission and not necessarily a straight admission. Pt wants to make sure he will be getting Rx at discharge.   Transportation Means: another pt's support will transport [facility] to [facility] morning.   Supports: girlfriend   Counselling psychologistmart, OncologistHeather LCSWA

## 2014-09-19 NOTE — Progress Notes (Addendum)
Patient discharge instructions reviewed and patient verified understanding.  Patient RX and med samples give to patient.  Patient belongings retrieved from locker 30.  Patient denies SI/HI and denies AVH.  [facility]

## 2014-09-24 NOTE — Progress Notes (Signed)
Patient Discharge Instructions:  After Visit Summary (AVS):   Faxed to:  09/24/14 Discharge Summary Note:   Faxed to:  09/24/14 Psychiatric Admission Assessment Note:   Faxed to:  09/24/14 Suicide Risk Assessment - Discharge Assessment:   Faxed to:  09/24/14 Faxed/Sent to the Next Level Care provider:  09/24/14 Faxed to [facility] @ [phone] [facility] to [facility] @ [phone]  Jerelene ReddenSheena E [place], 09/24/2014, 1:54 PM

## 2020-03-14 DEATH — deceased
